# Patient Record
Sex: Female | Born: 1985 | Hispanic: Yes | Marital: Single | Smoking: Former smoker
Health system: Western US, Academic
[De-identification: ages and names within clinical notes are randomized; demographics above are authoritative.]

## PROBLEM LIST (undated history)

## (undated) DIAGNOSIS — B977 Papillomavirus as the cause of diseases classified elsewhere: Secondary | ICD-10-CM

## (undated) DIAGNOSIS — Z531 Procedure and treatment not carried out because of patient's decision for reasons of belief and group pressure: Secondary | ICD-10-CM

## (undated) DIAGNOSIS — E669 Obesity, unspecified: Secondary | ICD-10-CM

## (undated) DIAGNOSIS — IMO0001 Reserved for inherently not codable concepts without codable children: Secondary | ICD-10-CM

## (undated) DIAGNOSIS — G43909 Migraine, unspecified, not intractable, without status migrainosus: Secondary | ICD-10-CM

## (undated) HISTORY — DX: Procedure and treatment not carried out because of patient's decision for reasons of belief and group pressure: Z53.1

## (undated) HISTORY — DX: Reserved for inherently not codable concepts without codable children: IMO0001

## (undated) HISTORY — DX: Migraine, unspecified, not intractable, without status migrainosus: G43.909

## (undated) HISTORY — DX: Papillomavirus as the cause of diseases classified elsewhere: B97.7

## (undated) HISTORY — DX: Obesity, unspecified: E66.9

## (undated) MED ORDER — CALCIPOTRIENE 0.005 % EX OINT
TOPICAL_OINTMENT | CUTANEOUS | 5 refills | Status: AC
Start: 2022-04-22 — End: ?

## (undated) MED ORDER — ESCITALOPRAM OXALATE 10 MG OR TABS
10.00 mg | ORAL_TABLET | Freq: Every day | ORAL | 1 refills | Status: AC
Start: 2021-11-24 — End: ?

## (undated) MED ORDER — OMEPRAZOLE 20 MG OR CPDR
20.00 mg | DELAYED_RELEASE_CAPSULE | Freq: Every day | ORAL | 2 refills | Status: AC
Start: 2022-10-29 — End: ?

---
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
## 2004-01-12 ENCOUNTER — Other Ambulatory Visit (HOSPITAL_BASED_OUTPATIENT_CLINIC_OR_DEPARTMENT_OTHER): Payer: Self-pay

## 2004-01-12 LAB — URINALYSIS
Bilirubin: NEGATIVE
Glucose: NEGATIVE
Ketones: NEGATIVE
Leuk Esterase: NEGATIVE
Nitrite: NEGATIVE
Protein: NEGATIVE
Specific Gravity: 1.022 (ref 1.002–1.030)
Urobilinogen: 0.2 (ref 0.2–?)
pH: 5 (ref 5.0–8.0)

## 2004-02-01 ENCOUNTER — Ambulatory Visit (HOSPITAL_BASED_OUTPATIENT_CLINIC_OR_DEPARTMENT_OTHER): Admitting: Gynecology

## 2004-02-04 LAB — CHLAMYDIA/GONORRHEA PCR, GENITAL: Chlamydia/GC PCR-Genital: NEGATIVE

## 2004-02-06 NOTE — Lab (Signed)
 SPECIMENS SUBMITTED:  Cervical (Pap) Smear     CLINICAL INFORMATION:  LMP: 11/2003  FINAL CYTOLOGIC INTERPRETATION:  No Atypical or Malignant Cells     SPECIMEN ADEQUACY:  Satisfactory for evaluation  The Pap smear is a screening test with an inherent low error rate. Although  a normal [negative] result is highly predictive of the absence of cervical  cancer and its precursor lesions, it does not exclude the presence of  significant disease. Results must be evaluated within the context of the  individual patient.  CONFIDENTIAL HEALTH INFORMATION: Health Care information is personal and  sensitive information. If it is being faxed to you it is done so under  appropriate authorization from the patient or under circumstances that do  not require patient authorization. You, the recipient, are obligated to  maintain it in a safe, secure and confidential manner. Re-disclosure  without additional patient consent or as permitted by law is prohibited.  Unauthorized re-disclosure or failure to maintain confidentiality could  subject you to penalties described in federal and state law. If you have  received this report or facsimile in error, please notify the [facility] immediately and destroy the received document(s).   Material reviewed and Interpreted and  Report Electronically Signed by:  Arie Begin O'Harra CT(ASCP)  Sr. Cytotechnologist  02/06/04 16:30  Electronic Signature derived from a single  controlled access password

## 2004-03-30 HISTORY — PX: CHOLECYSTECTOMY, LAP: 56340

## 2004-05-02 ENCOUNTER — Emergency Department (INDEPENDENT_AMBULATORY_CARE_PROVIDER_SITE_OTHER): Payer: Self-pay

## 2004-05-02 ENCOUNTER — Other Ambulatory Visit (INDEPENDENT_AMBULATORY_CARE_PROVIDER_SITE_OTHER): Payer: Self-pay | Admitting: Emergency Medicine

## 2004-05-02 ENCOUNTER — Inpatient Hospital Stay (HOSPITAL_COMMUNITY): Admitting: Surgery

## 2004-05-02 NOTE — ED Notes (Signed)
============================== ADMIT SUMMARY ==============================    RECEIVING NURSE - Hela      ED NURSE -     +------------------------------- ALLERGIES -------------------------------+   Pen(hives)    +-------------------------- ADMITTING DIAGNOSIS --------------------------+   1) acute abdominal pain   2) gallstone pancreatitis    +--------------------------- ADMITTING SERVICE ---------------------------+   Admit -- Surgery/HC   Level of Care -- Floor    +------------------------ MOST RECENT VITAL SIGNS ------------------------+  BP - 104/56 PULSE - 88   RESPIRATIONS - 18 O2 SAT -   TEMPERATURE - 98.2 MODE -   GCS TOTAL -   PAIN - PAIN QUALITY -   PAIN LOCATION -     +-------------------------------- FLUIDS ---------------------------------+  DATE TIME IV FLUID L/R LOCATION SIZE HUNG ABSORBED  ---------------------------------------------------------------------------    02/03 0530 Normal Saline Left Anticubital 20 1000 ml 0 ml  02/03 1207 Normal Saline Left Anticubital 20 1000 ml 1000 ml   TOTAL IV: 1000 ml    TOTAL OUTPUT: 0 ml TOTAL PO: 0 ml    +------------------------------ MEDICATIONS ------------------------------+  DATE TIME MEDICATION VERIFYING RN RN INIT  ---------------------------------------------------------------------------    02/03 0538 MAALOX ES 30 Milliliters PO RAF  02/03 0538 LIDOCAINE VISCOUS 2% 10 Milliliters RAF   PO  02/03 0538 BELLADONNA ALKALOIDS/PHENOBARB 10 RAF   Milliliters PO-(ELIXIR)  02/03 0538 NORMAL SALINE 1000 Milliliters RAF   IV-(BOLUS)  02/03 0539 PROMETHAZINE 12.5 Milligrams IV RAF  02/03 0820 HYDROMORPHONE 1 Milligrams IV KCS  02/03 1148 HYDROMORPHONE 1 Milligrams IV j   02/03 1206 METRONIDAZOLE 500 Milligrams IV j   02/03 1430 CIPROFLOXACIN 400 Milligrams IV erm    +------------------------------- LABS DONE -------------------------------+  ACT- MD MD RN AP +INITIALS+  IVE DATE TIME TIME TIME TREATMENT ORDERS MD RN AP    ---------------------------------------------------------------------------     02/03 0409 0507 0504 CLEAN CATCH URINALYSIS COMPLETE LGL KEM MQR   Specimen Type: Urine   02/03 0519 0523 0520 CHEM 7 + [place](BASIC META PANEL), LIVER SH RAF MQR   PANEL, LIPASE, CBC WITH DIFF**   Specimen Type: Blood    EKG DONE - NO    +---------------------------- PROCEDURE NOTES ----------------------------+    +------------------------ OTHER NURSING PROCEDURES -----------------------+     +--------------------------- PSYCHOSOCIAL NEEDS --------------------------+  +------------------------- BARRIERS TO LEARNING --------------------------+    ASSESSMENT- Assessment Done with Findings of:      BARRIERS-    No Barriers, Pain  SUPPORT PERSON- mother      SPECIAL CONSIDERATIONS-      ============================== TRIAGE RECORD ==============================    CHIEF COMPLAINT- Abdominal Pain    TIME OF ONSET- today : +-STANDING-+ +--SEATED--+  TRIAGE CATEGORY- 2 : BP PULSE BP PULSE   ROOM- 7A : N/A/N/A N/A 119/78 83   MODE OF ARRIVAL- Car :   IN CUSTODY- No : TEMP MODE O2SAT RESP LMP   PRIVATE MD- Loper : 98.1 Oral 99 18 03/30/2004       :   WORK RELATED INJURY- No : +--GCS--+ +--PUPILS--+  RETURN IN 72 HOURS- None : E V M TOT L R RESPONSE  TRIAGE NURSE- Arsenio Loader : X X X N/A X X N/A     IS THIS VISIT RELATED TO ASSAULT OR DOMESTIC VIOLENCE- None   PAIN TYPE- V NOW- 9 TOLERABLE AT- 4 QUALITY- Sharp      PAIN LOCATION- epigastric RADIATES TO- none   LATEX ALLERGY FORM- No LATEX ALLERGY- No TETANUS- < 5 years   IMMUNIZATION- UTD PED HEIGHT- N/A WEIGHT- N/A KG  ADDITIONAL FORMS- No   +------------------------- CARE PRIOR TO ARRIVAL -------------------------+   None  +------------------------------- ALLERGIES -------------------------------+   Pen(hives)  +------------------------------ MEDICATIONS ------------------------------+   None  +-------------------------- PAST MEDICAL HISTORY -------------------------+    None  +---------------------------- CURRENT HISTORY ----------------------------+   pt. here c/o epigastric pain since this pm, c/o associated vomiting x2    =========================== REASSESSMENT VITALS ===========================   R T M I   E E O N   S M D O2 PUPILS +---GCS----+ I  DATE TIME BP HR P P E SAT L R E V M TOT POSITION T  ---------------------------------------------------------------------------    02/03 0305 119/78 83 18 98.1 O 99 Seated KEM  02/03 0305 / Standing KEM  02/03 0817 133/84 84 16 98.6 O 4 5 6 15  Lying KCS  02/03 1300 138/96 78 18 98.6 O Lying erm  02/03 1300 / erm  02/03 1305 104/56 88 18 98.2 Lying erm     +-----------------PAIN-----------------+   T       Y N T       P O O      DATE TIME E W L LOCATION QUALITY RADIATES COMMENT      ---------------------------------------------------------------------------    02/03 0305 V 9 4 epigastric Sharp none Triage  02/03 0305 Triage  02/03 0817 V 7 4 epigastric Sharp no   02/03 1300   02/03 1300   02/03 1305     ============================= NURSE DISCHARGE =============================    DISCHARGE NURSE- richard marquez   DISPOSITION- Seen & Treated WITH- By Self   ACCOMPANIED BY- N/A   EQUIP Brantley Fling-    N/A   TIME OF DISPOSITION- 05/02/2004 1455 LEFT ED VIA- Wheel Chair   TEANSFERRED TO- N/A REASON- N/A   ADMITTED TO- 6th Floor ROOM- 623A      NURSE REPORT TO- Hela REPORT TIME- 1450   BELONGINGS- With Patient ENVELOPE NUMBER- N/A   CONDITION ON DISCHARGE- Improving   AFTERCARE PROVIDED WITH- Written and Verbal   WHAT AFTERCARE INSTRUCTIONS WERE GIVEN AND REVIEWED WITH PATIENT  AND/OR FAMILY?- (see EPIC instructions)   admitted   IN WHAT LANGUAGE WERE THESE GIVEN?- English OTHER: N/A   TRANSLATED BY- N/A OTHER: N/A   GCS- E: 4 V: 5 M: 6 TOTAL: 15   PAIN LEVEL UPON DISPOSITION- 4 OUT OF 10  EXPECTED OUTCOMES MET- Yes   WHAT MEDS WERE PROVIDED FROM DISCHARGE PYXIS?-    None   RX TO BE FILLED FOR- N/A   DID THE PATIENT OR RESPONSIBLE CARE  PROVIDER UNDERSTAND THE FOLLOW UP  RECOMMENDATION?- Yes DISPOSITION BY- RN        ============================== POINT OF CARE ==============================    OCCULT BLOOD STOOL RESULTS   Norm results neg.  DATE TIME RESULTS DONE BY CONTROL POSTIVE CONTROL NEGATIVE     URINE PREGNANCY TEST   Norm results for non pregnant females neg.  DATE TIME RESULTS DONE BY   02/03 0514 Neg RAF     URINE DIP   Norm results - All neg. with pH 4.6 to 8.0 and urobili 0.1 to 1.0   LEUKO NI- PRO- GLU- URO-   DATE TIME CYTE TRITE PH TEIN COSE KETONES BILI BILI BLOOD BY   02/03 0514 ++ n 5 n n n n n + RAF    FINGER STICK GLUCOSE   Norm results 60 to 110 mg/dl  DATE TIME RESULTS DONE BY     FINGER STICK HEMOGLOBIN   Norm results adult female  14 to 17 gm/dl  Norm results adult female 12 to 16 gm/dl  DATE TIME RESULTS DONE BY     ============================== MD NOTES H&P ===============================    TIME OF NOTE WRITTEN- N/A      CHIEF COMPLAINT- N/A        HISTORY OF PRESENT ILLNESS  02/03 0556 Ferdinand Cava, MD Attending     18y/o female with onset last evening of epig abd discomfort,   sharp, constant but with occasional exacerbations, feels   better if she lays still. occurred last pm after eating fast   food. has happened before over past several months similar   circumstances but only lasted a few minutes. tonight worse   and did not resolve. vom 3 times, no blood, no diarrhea or   melena. pain does radiate to back and sides. no lq abd pain,   no dysuria, freq, flank pain, no vag dc, lmp 1 mo ago, nl,   is 5 months s/p c section.      PAST MEDICAL/SURGICAL HISTORY  02/03 0556 Ferdinand Cava, MD Attending     denies, is o/w healthy      FAMILY HISTORY- denies, no significant fam med prob   SOCIAL HISTORY- denies cig, drugs   OTHER- denies, no significant fam med prob   REVIEW OF SYSTEMS- All other systems reviewed and are negative.     PHYSICAL EXAM  02/03 0556 Ferdinand Cava, MD Attending     overall well appearing, appears in  moderate discomfort   presently   OP moist, neck supple, sclera anicteric   HEENT o/w wnl   lungs cta   cor rrr   abd is obese, soft, pos epig and ruq tend, no frank murphy   sign but sx worsen with deep inspiration. no rlq or llq tend   no guarding no peritoneal signs.

## 2004-05-02 NOTE — ED Notes (Signed)
==============================   ATTENDING NOTE =============================    02/03 4782 Ferdinand Cava, MD Attending     see my H&P

## 2004-05-02 NOTE — ED Notes (Signed)
no cvat   calves nt no c/c/e   Note: explained indication for pelvic exam but patient   states has had too many pelvic exams with recent pregnancy,   does not feel her current sx are related to her pelvic   organs and refuses pelvic exam at this time.      IMPRESSION  02/03 0556 Ferdinand Cava, MD Attending     epig/ruq pain, n/vom. bedside US appears to show gallstone   and ? perichol fluid, cbd not visualized. note upt neg.      MEDICAL DECISION MAKING  02/03 0556 Ferdinand Cava, MD Attending     labs, ivf, antiemetic, ua, formal US in am, reassess.    CASE PRESENTED TO- N/A     ============================= PHYSICIAN NOTES =============================    02/03 1040 Corinne Ports, MD     [place]: 6 mm stone in dital cmd, dilated to 9mm, galsltones in   gallbladder thjickened and edeamtous, - murphys    02/03 1047 Corinne Ports, MD     spoke with dr Dewaine Conger, he wil admit pt, will call back with   abx of choice.      ============================= PROCEDURE NOTES =============================      ================================ LAB NOTES ================================    02/03 0800 Corinne Ports, MD     lipase 1500, lfts elevated    02/03 0800 Ferdinand Cava, MD Attending     labs noted, elevated lft, ap, bili, lipase. await [place]   results. will need admission for management, ivf, likely   surg consult.    02/03 1508 Corinne Ports, MD     [place] noted      ================================= IMAGES ==================================      ============================== MD DISCHARGE ===============================    DISCHARGE PHYSICIAN- Sean-Xavier Neath   CHIEF COMPLAINT- N/A CASE PRESENTED TO- Ferdinand Cava   CONDITION OF DISCHARGE- Stable   WAS THIS VISIT FOR A WORK RELATED ILLNESS OR INJURY- No      PRIMARY CARE PHYSICIAN- ADAMS JOYCE MD   HAS PCP BEEN CONTACTED- No H&P NOTE WAS DICTATED- No   DISCHARGE DIAGNOSIS-    1) acute abdominal pain 2) gallstone pancreatitis     DISCHARGE INSTRUCTIONS      ============================= FOLLOW UP NOTES  =============================

## 2004-05-02 NOTE — ED Notes (Signed)
=================================   ORDERS ==================================    ACT- MD MD RN AP +INITIALS+  IVE DATE TIME TIME TIME TREATMENT ORDERS MD RN AP   ---------------------------------------------------------------------------     02/03 0409 0507 0504 CLEAN CATCH URINALYSIS COMPLETE LGL KEM MQR   Specimen Type: Urine   02/03 0409 0507 0504 POC Urine Dip LGL KEM MQR   02/03 0409 0507 0504 POC UPT LGL KEM MQR   02/03 0519 0523 0520 CHEM 7 + [place](BASIC META PANEL), LIVER SH RAF MQR   PANEL, LIPASE, CBC WITH DIFF**   Specimen Type: Blood  NO 02/03 0519 0520 saline lock [facility] MQR   02/03 0520 0523 0520 MAALOX ES 30 Milliliters PO [facility] RAF MQR   02/03 0521 0523 0552 LIDOCAINE VISCOUS 2% 10 Milliliters SH RAF MQR   PO   02/03 0521 0523 0552 BELLADONNA ALKALOIDS/PHENOBARB 10 SH RAF MQR   Milliliters PO-(ELIXIR)  NO 02/03 0522 0523 0552 CANCELLED -- saline lock [facility] RAF MQR   02/03 0523 0523 0552 NORMAL SALINE 1000 Milliliters SH RAF MQR   IV-(BOLUS)   02/03 0523 0526 0553 PROMETHAZINE 12.5 Milligrams IV SH RAF MQR   02/03 0543 0544 0552 ABDOMEN GENERAL SURVEY [place] SH RAF MQR   Rad Indication: ruq pain, possible   cholecystitis   02/03 0822 0824 0825 HYDROMORPHONE 1 Milligrams IV jab KCS ara   02/03 0822 0824 0825 npo jab KCS ara   02/03 0823 0824 0825 100 cc ns/ hour jab KCS ara   02/03 1059 1115 1103 Admit -- Surgery/HC jab j RXG   Level of Care -- Floor   02/03 1125 1148 1135 change ivf to 150 ns hour jab j ara   02/03 1125 1206 1135 METRONIDAZOLE 500 Milligrams IV jab j ara   02/03 1126 1211 1135 CIPROFLOXACIN 400 Milligrams IV jab j ara   02/03 1126 1148 1136 HYDROMORPHONE 1 Milligrams IV jab j ara   02/03 1205 1206 1211 1 l ns open jab j [facility]

## 2004-05-03 ENCOUNTER — Other Ambulatory Visit (INDEPENDENT_AMBULATORY_CARE_PROVIDER_SITE_OTHER): Payer: Self-pay

## 2004-05-04 NOTE — Consults (Signed)
 Dictating Practitioner: Robb Matar, M.D.    Staff Physician: Ihor Austin. Richardean Canal, M.D.      Date of Consultation: 05/03/2004          GASTROINTESTINAL INPATIENT CONSULTATION    REASON FOR CONSULTATION: Gallstone pancreatitis and questionable need for  ERCP.    HISTORY OF PRESENT ILLNESS: The patient is a 19 year old female without  significant past medical history, who presented to the Emergency Room late  last night with complaints of epigastric pain and nausea and vomiting. The  patient states she has had a similar pain for a while, many months, and she  states it is usually related to spicy foods. The pain is similar in the  same epigastric location, but usually in the past has gone away after 5-10  minutes. She states that the previous pain was about a 4 out of 10 and  occurred approximately two times a week. However, last night around 8  p.m., she developed the same epigastric pain. However, it was more intense  and did not go away. She subsequently developed nausea and vomiting, no  hematemesis and noticed that the pain increased with deep inspiration. She  states the pain was a 10/10 constant, radiating all throughout her abdomen.  She did not report any diarrhea, constipation, bright red blood per rectum  or melena. She also reported no fevers or chills at home.    Since her admission, she has been placed on a Dilaudid PCA which she states  has made her feel much better and the pain is controlled. However, she did  spike a fever late last night to 101.6.    PAST MEDICAL AND SURGICAL HISTORY: Status post C-section August 2005,  history of pyelonephritis with pseudomonas resistant to Bactrim and  obesity.    ALLERGIES: NO KNOWN DRUG ALLERGIES.    MEDICATIONS: Birth control pills.    SOCIAL HISTORY: No tobacco or alcohol.    MEDICATIONS: Cipro, Flagyl, Dilaudid PCA, Reglan, ondansetron p.r.n. and  D5 1/2 normal saline at 150 ml per hour.    PHYSICAL EXAMINATION: Vital Signs: T-max at around 11:30 last  night,  101.6, current temperature 100.1, pulse 99 to 135, blood pressure 142/91,  respirations 20, saturating 97% on room air. General: The patient is an  obese female, appears in no acute distress, comfortable on PCA Dilaudid,  without evidence of jaundice. Cardiac: Tachycardic, regular rhythm, S1  and S2. Chest: Clear to auscultation bilaterally. Abdomen: Soft, obese,  positive bowel sounds, positive epigastric tenderness to palpation.  However, no rebound or guarding. Please note abdominal exam is while on  Dilaudid PCA. Extremities: No cyanosis, clubbing or edema.    LABORATORY DATA: Sodium 139, potassium 3.5, chloride 101, CO2 25, BUN 7,  creatinine 0.7, albumin 3.0. Blood cultures pending. AST 142, down from  256, ALT 336, down from 421. Alkaline phosphatase 195, down from 209.  Direct bilirubin 2.7, up from 1.6, total bilirubin 4.2, up from 2.7, lipase  935, down from 1535, amylase is 680. CBC shows a white count of 21.8, up  from 10.5, with a neutrophil predominance of 91%, 0 bands. Hemoglobin  15.2, hematocrit 41.5, platelets 226, INR 1.1. Urinalysis on admission is  negative.    Abdominal ultrasound on 05/02/04 shows increasing liver size and  echogenicity consistent with fatty liver. No intrahepatic biliary  dilatation. Positive gallstones, along with gallbladder wall thickening  and edema. However, no pericholecystic fluid appreciated. A 6-mm stone  seen in the distal common bile duct with a common  bile duct dilated to 9  mm.    ASSESSMENT AND PLAN: The patient is a 19 year old female without  significant past medical history, now with evidence of gallstone  pancreatitis and cholecystitis. She has been febrile and tachycardic, and  is currently receiving intravenous antibiotics. Her abdominal ultrasound  shows a medium-sized stone in the distal common bile duct with a dilated  common bile duct to 9 mm. She also has elevated amylase and lipase  consistent with pancreatitis, but these numbers are trending  down and her  symptoms are controlled with Dilaudid PCA. Of concern, is the patient's  fever, and increasing leukocytosis, which is worrisome for possible  cholangitis. At this time, her temperature is coming down from 101.6 to  100.1. Her blood pressure is stable, although she does remain tachycardic  and her alkaline phosphatase, ALT and AST are all trending down. Agree  with General Surgery evaluation that the patient may need ERCP for possible  cholangitis and in order to extract the distal common bile duct stone.  However, she may also pass this medium-sized stone on her own, and avoid  the possibility of worsening her pancreatitis with an additional procedure  such as an ERCP.    Currently she is still in the window for emergent ERCP within the 48 hours  of admission, and therefore, we will carefully monitor her in the next  several hours with her vital signs, blood pressure and temperature. She  will be transferred to the IMU per the surgery team, and will continue her  current management with intravenous antibiotics in the form of Cipro and  Flagyl along with a Dilaudid PCA for pain and anti-emetics as needed. We  will also continue aggressive IV fluids, and keep her n.p.o. We will  follow her AST, ALT, total and direct bilirubin, alkaline phosphatase,  amylase, lipase, and CBC with differential in the morning. If these  numbers are not improving or if the patient continues to spike fevers or  becomes in any way unstable and more septic appearing, then we will perform  ERCP tomorrow.    Addendum: After discussion between Dr. Odis Luster and Dr. Richardean Canal, it was  decided to proceed with emergency ERCP immediately because of the ongoing  pain, tachycardia, and elevated WBC, with the known stone on [place].    This case was discussed with the attending, Dr. Richardean Canal.            Job Number [id number] cth              Signature Derived From Controlled Access Password  Ihor Austin. Richardean Canal, M.D. 05/04/2004 14:35    DD: 05/03/2004  DT: 05/03/2004 2:08 P DocNo.: [id number]  [id number]/[email or web address] [email or web address]    Referring Physician:  EMERGENCY DEPARTMENT        Primary Care Physician:  Cleta Alberts MD  [address] [facility]  [place], [place] [address]    cc:

## 2004-05-04 NOTE — Op Note (Signed)
 Dictating Practitioner: Ihor Austin. Richardean Canal, M.D.    Staff Physician: Ihor Austin. Richardean Canal, M.D.    Date of Operation: 05/03/2004              PREOPERATIVE DIAGNOSIS: Choledocholithiasis.  POSTOPERATIVE DIAGNOSIS: Choledocholithiasis.  PROCEDURE: Endoscopic retrograde cholangiogram with biliary sphincterotomy  and removal of common bile duct stone.  ENDOSCOPIST: Jeni Salles ASSISTANT ENDOSCOPIST: Francesca Jewett    INDICATION: This is a 19 year old woman who presented with gallstone  pancreatitis. Ultrasound showed a 6 mm stone in the common bile duct. She  also had stones in her gallbladder. She has been tachycardic and febrile  despite IV antibiotics. Therefore, there is concern that there might be a  component of persistent cholangitis due to a stone obstruction. For this  reason, emergency ERCP is being performed. Because this is considered to  be a high-risk case, the services of the Anesthesia Department have been  requested. The patient and her parents were explained the risks, benefits  and alternatives and signed consent was obtained.    MEDICATIONS: General anesthesia administered by the Anesthesia  Department.    FINDINGS: A therapeutic duodenoscope was passed into the patient's mouth.  The esophagus, stomach and duodenum were grossly normal. The ampulla did  seem to be bulging. Selective cannulation was obtained using a wire-guided  sphincterotome. Initial injection of contrast revealed a probable 6 mm  round filling defect in the distal bile duct. Additionally, the distal  bile duct seemed to have an angulated taper which suggested there could  actually be stone or debris in there as well. A biliary sphincterotomy was  performed in the standard fashion with a good gush of yellow, non-purulent  bile as well as debris. After this, a balloon was used to sweep through a  moderate amount of stone and debris. After this, a wire basket was used to  sweep the duct and nothing else was seen. The duct was irrigated  with  sterile water. After this, a balloon cholangiogram was obtained which  showed a completely normal cholangiogram. The bile duct measured at most  approximately 10 mm and tapered to normal. Note that no pancreatogram was  ever obtained during this procedure.    The patient tolerated the procedure well without any immediate  complications.    IMPRESSION: Successful removal of a small 6 mm common bile duct stone as  well as some sludge within the bile duct. Note that the patient's elevated  white blood cell count and pain may be due to severe acute pancreatitis,  rather than from cholangitis.    RECOMMENDATIONS: (1) Continue intravenous antibiotics. (2) Observe for  signs of severe pancreatitis. (3) Elective cholecystectomy when the  patient is clinically stable. (4) Further recommendations as per the  Surgery service and Dr. Etter Sjogren. (5) Consider abdominal CT to assess  for severity of acute pancreatitis.                          Signature Derived From Controlled Access Password  Ihor Austin. Richardean Canal, M.D. 05/04/2004 14:35    DD: 05/03/2004 DT: 05/04/2004 10:16 A DocNo.: [id number]  [id number].[facility]    Referring Physician:  EMERGENCY DEPARTMENT        Primary Care Physician:  Cleta Alberts MD  [address] [facility]  [place], [place] [address]    cc:

## 2004-05-05 ENCOUNTER — Ambulatory Visit (INDEPENDENT_AMBULATORY_CARE_PROVIDER_SITE_OTHER): Payer: Self-pay

## 2004-05-05 NOTE — Interdisciplinary (Signed)
PERFORMED ON - 05/04/2004 13:00:00;   DONE BY - MCPHERSON, GWENDOLYN M;  PROCEDURE - PDP EVALUATION;   NEURO STATUS: ALERT AND ORIENTED;   HEARTRATE: 129;   RESP RATE: 24;   BREATH SOUNDS: RALES BILAT;   PRE-TITR FIO2 OR L/M: 21;   PRE-TITR SAO2: 88;   TITRATED FIO2 OR L/M: 2;   POST TITRATION SAO2: 94;   MD REQUEST #1: OXYGEN PROTOCOL;   RCP RECOMMENDATION: #1: PER MD REQUEST;   INDICATION: #1: O: TO KEEP SA02 >92;   EVALUATION OUTCOME: #1: AS RCP RECOMMENDED;   ADVERSE REACTIONS: NONE;

## 2004-05-05 NOTE — Interdisciplinary (Signed)
PERFORMED ON - 05/05/2004 16:27:34;   DONE BY - COSTELLO, ROSANNA;  PROCEDURE - PDP RE-EVALUATION;   ADMITTING DIAGNOSIS: GALLSTONES;   NEURO STATUS: ALERT AND ORIENTED;   CODE STATUS: FULL CODE/FULL CARE;   HEARTRATE: 78;   RESP RATE: 16;   HEMOGLOBIN: 12.0;   CLUBBING PRESENT: NO;   TRACHEOSTOMY PRESENT: NO;   SOB: NO;   BREATH SOUNDS: CLEAR;   CHEST EXCURSION: SYMMETRICAL;   MD REQUEST #1: OXYGEN PROTOCOL;   RCP RECOMMENDATION: #1: O2 PROTOCOL D/C;   INDICATION: #1: NOT INDICATED;   EVALUATION OUTCOME: #1: AS RCP RECOMMENDED;   ADVERSE REACTIONS: NONE;

## 2004-05-05 NOTE — Interdisciplinary (Signed)
PERFORMED ON - 05/04/2004 15:10:36;   DONE BY - MCPHERSON, GWENDOLYN M;  PROCEDURE - OXYGEN-LOW FLOW;   PROTOCOL DRIVEN: YES-MD INITIATED;   NEURO STATUS: ALERT AND ORIENTED;   O2 DEVICE: CANNULA-NASAL;   LITERS/MIN: 2;   SATURATION: 94;   O2 LOW-ACTIV OUTCME: CONTINUE PRESENT REGIMEN;   ADVERSE REACTIONS: NONE;

## 2004-05-05 NOTE — Interdisciplinary (Signed)
PERFORMED ON - 05/05/2004 16:27:14;   DONE BY - COSTELLO, ROSANNA;  PROCEDURE - OXYGEN-LOW FLOW;   PROTOCOL DRIVEN: YES-MD INITIATED;   O2 DEVICE: CANNULA-NASAL;   ROOM AIR SAT: 97;   O2 LOW-ACTIV OUTCME: DISCONTINUE THERAPY;   ADVERSE REACTIONS: NONE;

## 2004-05-06 NOTE — Consults (Signed)
 Dictating Practitioner: Robb Matar, M.D.    Staff Physician: Ihor Austin. Richardean Canal, M.D.      Date of Consultation: 05/03/2004      ADDENDUM TO CONSULTATION REPORT IN FINAL STATUS, DOCUMENT #[id number]    After extensive discussion with the Surgical Service regarding plan of care  for the patient, it was decided that the plan will be to take her to the  operating room for cholecystectomy tomorrow; therefore, ERCP will be needed  prior to her surgery because of the known common bile duct obstruction.  Therefore, we will arrange for ERCP tonight.          Signature Derived From Controlled Access Password  Ihor Austin. Richardean Canal, M.D. 05/06/2004 09:23    DD: 05/03/2004 DT: 05/05/2004 9:34 A DocNo.: [id number]  DK/[email or web address]    Referring Physician:  EMERGENCY DEPARTMENT        Primary Care Physician:  Cleta Alberts MD  [address] [facility]  [place], [place] [address]    cc:

## 2004-05-08 ENCOUNTER — Other Ambulatory Visit (INDEPENDENT_AMBULATORY_CARE_PROVIDER_SITE_OTHER): Payer: Self-pay

## 2004-05-12 ENCOUNTER — Inpatient Hospital Stay (INDEPENDENT_AMBULATORY_CARE_PROVIDER_SITE_OTHER): Payer: Self-pay

## 2004-05-14 ENCOUNTER — Ambulatory Visit (HOSPITAL_BASED_OUTPATIENT_CLINIC_OR_DEPARTMENT_OTHER): Admitting: Surgery

## 2004-05-21 ENCOUNTER — Other Ambulatory Visit (INDEPENDENT_AMBULATORY_CARE_PROVIDER_SITE_OTHER): Payer: Self-pay

## 2004-05-21 ENCOUNTER — Ambulatory Visit (HOSPITAL_BASED_OUTPATIENT_CLINIC_OR_DEPARTMENT_OTHER): Admitting: Surgery

## 2004-05-23 NOTE — Discharge Summary (Signed)
 Dictating Practitioner: [place] H. Jonessa Triplett, M.D.    Attending Physician: Ronelle Nigh, M.D.      Date of Admission: 05/02/2004  Date of Discharge: 05/12/2004          DISCHARGE DIAGNOSES: (1) Gallstone pancreatitis. (2) Obesity.    PROCEDURES: ERCP with sphincterotomy and removal of stone.    DISCHARGE MEDICATIONS: Flagyl 500 mg q. 8 hours times 14 days, Cipro 500  mg b.i.d. times 14 days.    FOLLOWUP APPOINTMENTS: The patient will follow up this Wednesday, May 13, 2004, in General Surgery Clinic. She will have CBC drawn prior to  clinic.    PRESENT ILLNESS: The patient is a 19 year old female with epigastric  abdominal pain. She has been experiencing intermittent abdominal pain,  crampy for several months, primary care physician put her on an H2 blocker  and now she is presenting with severe epigastric pain times 12 hours.  Ultrasound showed a 9 mm common bile duct stone with nonobstructing distal  gallstone. LFTs are elevated, AST 256, ALT 421, total bilirubin 2.7,  direct bilirubin 1.6, alkaline phosphatase is 209, lipase 1500 on  admission.    PAST MEDICAL HISTORY: The patient has no past medical history other than  the C. section.    HOSPITAL COURSE: The patient was admitted and started on conservative  therapy. She was given IV hydration and started on Cipro and Flagyl. GI  was consulted and performed an ERCP with sphincterotomy and the stone was  removed. Postoperatively she did fine and she said her pain was resolving.  However, she still continued to have elevated white count. She was started  on IV meropenem. Of note, the patient had past ALLERGY TO PENICILLIN and  she was required to undergo a meropenem titration. She tolerated this  without any complications. The patient did fine on the meropenem and was  transferred to the floor. However, after the meropenem was stopped after a  couple of days, her white count again continued to elevate and she began to  have low-grade fevers. The patient did not  complain of any problems,  however. The meropenem was again restarted and titrated as well and the  patient became afebrile as well as white count was reduced to 11.4 on the  day of discharge. Otherwise, the patient had no problems while in house.  She remained ambulatory. Her abdominal pain was not present at the time of  discharge. At discharge, she was tolerating a p.o. diet and having bowel  movements. She has been instructed to take it easy on her p.o. diet after  discharge. We will be seeing her in two days in outpatient General Surgery  Clinic. She has been instructed to get a CBC drawn prior to her  presentation. This is just to keep a very close eye on her.    CONDITION ON DISCHARGE: Improved.                      Reviewed & Electronically Signed  [place] H. Marche Hottenstein, M.D. 05/20/2004 17:57    Signature Derived From Controlled Access Password  Ronelle Nigh, M.D. 05/23/2004 17:59    DD: 05/12/2004 DT: 05/13/2004 11:16 A DocNo.: [id number]  [id number].[facility]      Referring Physician:  EMERGENCY DEPARTMENT        Primary Care Physician:  Cleta Alberts MD  [address] [facility]  [place], [place] [address]    cc:

## 2004-06-11 ENCOUNTER — Encounter (HOSPITAL_BASED_OUTPATIENT_CLINIC_OR_DEPARTMENT_OTHER): Payer: Self-pay | Admitting: Surgery

## 2004-07-02 ENCOUNTER — Ambulatory Visit (HOSPITAL_BASED_OUTPATIENT_CLINIC_OR_DEPARTMENT_OTHER): Admitting: Surgery

## 2004-08-07 ENCOUNTER — Other Ambulatory Visit (INDEPENDENT_AMBULATORY_CARE_PROVIDER_SITE_OTHER): Payer: Self-pay | Admitting: Surgery

## 2004-08-07 ENCOUNTER — Ambulatory Visit: Admitting: Surgery

## 2004-08-20 ENCOUNTER — Ambulatory Visit (HOSPITAL_BASED_OUTPATIENT_CLINIC_OR_DEPARTMENT_OTHER): Admitting: Surgery

## 2004-08-28 NOTE — Progress Notes (Signed)
CLINIC: GENERAL SURGERY    REPORT TYPE: NOTE    Dictating Practitioner: Ronelle Nigh, M.D.      DATE OF SERVICE: 08/21/2004    REASON FOR VISIT: FOLLOWUP    HISTORY OF PRESENT ILLNESS: Ms. Mijangos is here postoperatively, doing well  without complaints. Please see the handwritten note.    PLAN: We will see her back on a p.r.n. basis.      Job #: [id number]          Signature Derived From Controlled Access Password  Ronelle Nigh, M.D. 08/28/2004 07:38        DD: 08/21/2004 DT: 08/22/2004 7:00 A DocNo.: [id number]  [id number]      Primary Care Physician:  Loreli Slot        cc:    : Malachi Paradise, MD  : [facility]

## 2004-09-23 ENCOUNTER — Emergency Department (INDEPENDENT_AMBULATORY_CARE_PROVIDER_SITE_OTHER): Payer: Self-pay

## 2004-09-23 ENCOUNTER — Emergency Department (HOSPITAL_BASED_OUTPATIENT_CLINIC_OR_DEPARTMENT_OTHER): Admitting: Emergency Medicine

## 2004-09-23 NOTE — ED Notes (Signed)
=================================   ORDERS ==================================    ACT- MD MD RN AP +INITIALS+  IVE DATE TIME TIME TIME TREATMENT ORDERS MD RN AP   ---------------------------------------------------------------------------     06/27 2127 2128 2131 Discharge SPN TLB GAS

## 2004-09-23 NOTE — ED Notes (Signed)
==============================   ATTENDING NOTE =============================    06/27 2206 Kenn File, MD Attending     Patient seen, I was present for the relevant part of the   history and physical examination. Management of the case   discussed with the resident, Dr. Gerhard Perches. I have reviewed the   residents H&P.       Key elements of the history notable for 19 yo HF with pmhx   sig for c-section in 10/2003 and laparoscopic cholecystectomy   1 month ago presents with erythema and ttp at the left edge   of the c-section scar. no f/ch. no masses. pt called her   clinic and they told her to come to the ED. no n/v/d. no   dysuria or hematuria. no drainage. no fluctuance or   induration. no h/o recurrent abscesses. Key elements of   the physical examination are notable for overall well   appearing hf in nad, mentation intact. vs noted. afebrile.   o2 sat 97% on room air which is normal. heent: ncat. abd:   nabs. soft. nt. nd. no hsm. small amount of erythema at the   left edge of the c-section incision. no fluctuance. no   induration. mild ttp. no drainage. ext: no c/c/e. neuro:   awake and alert. ox4. maew. normal gait.       A/P: 1. small stitch abscess vs folliculitis   no need for i and d at this time. no need for abx.    recommend warm compresses and then follow up in local clinic   in 2-3 days. return for worsening redness, pain, drainage,   induration or fluctuance. I agree with the residents   findings, work up, assessment/management plans.

## 2004-09-23 NOTE — ED Notes (Signed)
=============================   PROCEDURE NOTES =============================      ================================ LAB NOTES ================================      ================================= IMAGES ==================================      ============================== MD DISCHARGE ===============================    DISCHARGE PHYSICIAN- Sean Nordt   CHIEF COMPLAINT- N/A CASE PRESENTED TO- Kenn File   CONDITION OF DISCHARGE- Stable   WAS THIS VISIT FOR A WORK RELATED ILLNESS OR INJURY- No      PRIMARY CARE PHYSICIAN- WU JENNIFER MD   HAS PCP BEEN CONTACTED- No H&P NOTE WAS DICTATED- No     +-------------------------- DISCHARGE DIAGNOSIS --------------------------+    WRITE IN DIAGNOSIS-    N/A folliculitis     +------------------------- DISCHARGE INSTRUCTIONS------------------------+    06/27 2127 [name], MD     PHYSICIAN- Kenn File, MD Attending FOLLOW-UP (DAYS)- 2 days    APPOINTMENT- No RETURN TO- N/A LANGUAGE- English    INSTRUCTIONS-   CELLULITIS    MEDICATIONS-   REFERRAL CLINICS-   REFERRAL PHYSICIANS-   ADDITIONAL INSTRUCTIONS-   You were seen today for your skin infection.    This appears to be an ingrown hair.    Apply warm compresses to the area four times a day to help    open up and drain the wound.    Return to the E.R. for severe, persistent, new or worsening   symptoms    or any other concerns.    Follow up with your primary care physician for continued   care.      ============================= FOLLOW UP NOTES =============================

## 2004-09-23 NOTE — ED Notes (Signed)
============================== ADMIT SUMMARY ==============================    RECEIVING NURSE -   ED NURSE -     +------------------------------- ALLERGIES -------------------------------+   pcn    +-------------------------- ADMITTING DIAGNOSIS --------------------------+       +--------------------------- ADMITTING SERVICE ---------------------------+  ADMISSION SERVICE -   LEVEL OF CARE -   ATTENDING -   RESIDENT -     +------------------------ MOST RECENT VITAL SIGNS ------------------------+  BP - 136/88 PULSE - 96   RESPIRATIONS - 16 O2 SAT - 97   TEMPERATURE - 99.4 MODE - Oral   GCS TOTAL - 15   PAIN - 6 PAIN QUALITY - Constant   PAIN LOCATION - scar   DATE/TIME - 09/23/2004 2028    +-------------------------------- FLUIDS ---------------------------------+  DATE TIME IV FLUID L/R LOCATION SIZE HUNG ABSORBED  ---------------------------------------------------------------------------     TOTAL IV: 0 ml    TOTAL OUTPUT: 0 ml TOTAL PO: 0 ml    +------------------------------ MEDICATIONS ------------------------------+  DATE TIME MEDICATION VERIFYING RN RN INIT  ---------------------------------------------------------------------------      +------------------------------- LABS DONE -------------------------------+  ACT- MD MD RN AP +INITIALS+  IVE DATE TIME TIME TIME TREATMENT ORDERS MD RN AP   ---------------------------------------------------------------------------      EKG DONE - NO    +---------------------------- PROCEDURE NOTES ----------------------------+    +------------------------ OTHER NURSING PROCEDURES -----------------------+     +--------------------------- PSYCHOSOCIAL NEEDS --------------------------+  +------------------------- BARRIERS TO LEARNING --------------------------+    ASSESSMENT- Assessment Done with Findings of:      BARRIERS-    No Barriers  SUPPORT PERSON-      SPECIAL CONSIDERATIONS-      ============================== TRIAGE RECORD ==============================    CHIEF  COMPLAINT- Wound Check    TIME OF ONSET- 1 wk : +-STANDING-+ +--SEATED--+  TRIAGE CATEGORY- 3 : BP PULSE BP PULSE   ROOM- UC1 : N/A/N/A N/A 136/88 96   MODE OF ARRIVAL- Car :   IN CUSTODY- No : TEMP MODE O2SAT RESP LMP   PRIVATE MD- N/A : 99.4 Oral 97 16 09/09/04       :   WORK RELATED INJURY- No : +--GCS--+ +--PUPILS--+  RETURN IN 72 HOURS- None : E V M TOT L R RESPONSE  TRIAGE NURSE- Ruth Hall : 4 5 6 15  X X N/A     IS THIS VISIT RELATED TO ASSAULT OR DOMESTIC VIOLENCE- no   PAIN TYPE- V NOW- 6 TOLERABLE AT- 4 QUALITY- Constant      PAIN LOCATION- scar RADIATES TO- none   LATEX ALLERGY FORM- No LATEX ALLERGY- No TETANUS- Does not recall  IMMUNIZATION- UTD PED HEIGHT- N/A WEIGHT- N/A KG  ADDITIONAL FORMS- No   +------------------------- CARE PRIOR TO ARRIVAL -------------------------+   none  +------------------------------- ALLERGIES -------------------------------+   pcn  +------------------------------ MEDICATIONS ------------------------------+   BCP  +-------------------------- PAST MEDICAL HISTORY -------------------------+   cholestectomy, c-section  +---------------------------- CURRENT HISTORY ----------------------------+   pt had c-section 9/05 an on the corner of her incision there is a "bump"   that is getting "bigger and red." no discharge    =========================== REASSESSMENT VITALS ===========================   R T M I   E E O N   S M D O2 PUPILS +---GCS----+ I  DATE TIME BP HR P P E SAT L R E V M TOT POSITION T  ---------------------------------------------------------------------------    06/27 2028 136/88 96 16 99.4 O 97 4 5 6 15  Seated KZ   06/27 2028 / Standing KZ      +-----------------PAIN-----------------+   T  Y N T       P O O      DATE TIME E W L LOCATION QUALITY RADIATES COMMENT      ---------------------------------------------------------------------------    06/27 2028 V 6 4 scar Constant none Triage  06/27 2028 Triage    ============================= NURSE DISCHARGE  =============================    DISCHARGE NURSE- Ruth Hall   DISPOSITION- Treated and Discharged WITH- Family   ACCOMPANIED BY- N/A   EQUIP W/TRANSPORT-    N/A   TIME OF DISPOSITION- 09/23/2004 2130 LEFT ED VIA- [facility]   TRANSFERRED TO- N/A REASON- N/A   ADMITTED TO- N/A ROOM- N/A   NURSE REPORT TO- N/A REPORT TIME- X N/A      BELONGINGS- N/A ENVELOPE NUMBER- N/A   CONDITION ON DISCHARGE- Improving   AFTERCARE PROVIDED WITH- Written and Verbal   WHAT AFTERCARE INSTRUCTIONS WERE GIVEN AND REVIEWED WITH PATIENT  AND/OR FAMILY?- (see EPIC instructions)   follow up/warm compress   IN WHAT LANGUAGE WERE THESE GIVEN?- English OTHER: N/A   TRANSLATED BY- N/A OTHER: N/A   GCS- E: N/A V: N/A M: N/A TOTAL: N/A  PAIN LEVEL UPON DISPOSITION- 4 OUT OF 10  EXPECTED OUTCOMES MET- Yes   WHAT MEDS WERE PROVIDED FROM DISCHARGE PYXIS?-    None   RX TO BE FILLED FOR- N/A   DID THE PATIENT OR RESPONSIBLE CARE PROVIDER UNDERSTAND THE FOLLOW UP  RECOMMENDATION?- Yes DISPOSITION BY- RN      NURSING LEVEL- 1. Simple ED Visit: Minimal Interaction     ============================== POINT OF CARE ==============================    OCCULT BLOOD STOOL RESULTS   Norm results neg.  DATE TIME RESULTS DONE BY CONTROL POSTIVE CONTROL NEGATIVE     URINE PREGNANCY TEST   Norm results for non pregnant females neg.  DATE TIME RESULTS DONE BY     URINE DIP   Norm results - All neg. with pH 4.6 to 8.0 and urobili 0.1 to 1.0   LEUKO NI- PRO- GLU- URO-   DATE TIME CYTE TRITE PH TEIN COSE KETONES BILI BILI BLOOD BY     FINGER STICK GLUCOSE   Norm results 60 to 110 mg/dl  DATE TIME RESULTS DONE BY     FINGER STICK HEMOGLOBIN   Norm results adult female 38 to 17 gm/dl  Norm results adult female 34 to 16 gm/dl  DATE TIME RESULTS DONE BY     ============================== MD NOTES H&P ===============================    TIME OF NOTE WRITTEN- N/A      CHIEF COMPLAINT- N/A        HISTORY OF PRESENT ILLNESS  06/27 2120 Ruth Leaver, MD     19 y/o female w/ pmh of  c section 09/05, s/p cholecystectomy   c/o 7d hx of LT sided painful "bump" at lateral aspect of   pfanensteil incision scar, no f/c/n/v/d, no other   constitutuional sx, no problems or infxn at scar site   previously, no dm, pt has large pannus and pubic hair line   crosses incision site, no pus or discharge, ros own      PAST MEDICAL/SURGICAL HISTORY  06/27 2120 Ruth Leaver, MD     as in triage noted      FAMILY HISTORY- nc   SOCIAL HISTORY- nc   OTHER- nc   REVIEW OF SYSTEMS- All other systems reviewed and are negative.     PHYSICAL EXAM  06/27 2124 Ruth Leaver, MD     Vital signs reviewed and noted  obese female in NAD   A&O*3    nontoxic    HEENT NCAT, PERRL, EOMI, no conjunctival pallor, no scleral   icterus, MMM    Neck supple nt, no lad, no thyromegaly    Chest CTAB no added sounds    Card RRR no added sounds    ABD soft, obese, well healed pfanensteil incision w/ small   papule w/ small punctum c/w folliculitis at lateral aspect   of incision site, no fluctuance, no surrounding cellulitis,   no induration, mild ttp, +BS, no m/r/g    Back no CVAT, no spinal TTP   EXT warm, dry, no c/c/e, 2/2 pulses, 5/5 strength    Neuro: cn II-XII grossly intact, grossly nonfocal      IMPRESSION  06/27 2120 Ruth Leaver, MD     -19 y/o female w/ probable folliculitis      MEDICAL DECISION MAKING  06/27 2121 Ruth Leaver, MD     -no fluctuance, no need for I+D or abx    -dx folliculitis    -plan warm compresses to area QID to allow to drain    -RTED d/w pt    -pt dc'd in good condition    CASE PRESENTED TO- N/A     ============================= PHYSICIAN NOTES =============================

## 2004-11-26 ENCOUNTER — Telehealth (INDEPENDENT_AMBULATORY_CARE_PROVIDER_SITE_OTHER): Payer: Self-pay | Admitting: Family Medicine

## 2004-11-26 NOTE — Telephone Encounter (Signed)
Pt hit L. Foot 4th digit on trash can. It is now discolored swollen and becomes painful upon ambulation (too much). Asked her to keep foot elevated, iced, and to take anti-inflammatoid medication. Scheduled appointment for Friday at 4:00pm with Dr. Salley Scarlet.

## 2004-11-26 NOTE — Telephone Encounter (Signed)
Patient has yet to be seen in our clinic. Patient banged her toe against a trash can Monday morning. Patient is able to walk on it, but this causes swelling. There is bruising on the toe as well. Patient needs to be advised what to do. Please return call to patient at [phone]. Thank you.

## 2004-11-27 NOTE — Telephone Encounter (Addendum)
Spoke with pt r/t time to show up for X-ray and to tape toe.

## 2004-11-27 NOTE — Telephone Encounter (Signed)
Herma Ard you a message but forgot to mention to tell pt to tape toe to adjacent toe for support/symptomatic relief. Xrays ordered but need not be done if swelling/pain improves prior to appt.    Thanks.    Barkley Bruns

## 2004-11-28 ENCOUNTER — Ambulatory Visit (INDEPENDENT_AMBULATORY_CARE_PROVIDER_SITE_OTHER)

## 2004-11-28 ENCOUNTER — Encounter (INDEPENDENT_AMBULATORY_CARE_PROVIDER_SITE_OTHER): Payer: Self-pay

## 2004-11-28 VITALS — BP 114/79 | HR 84 | Temp 98.5°F | Wt 206.0 lb

## 2004-11-28 MED ORDER — IBUPROFEN 200 MG OR CAPS
ORAL_CAPSULE | ORAL | Status: DC
Start: ? — End: 2006-03-05

## 2004-11-28 MED ORDER — ORTHO-CYCLEN (28) 0.25-35 MG-MCG OR TABS
ORAL_TABLET | ORAL | Status: DC
Start: ? — End: 2005-01-20

## 2004-11-28 NOTE — Patient Instructions (Signed)
TOE FRACTURE   GENERAL INFORMATION:     What is it? A toe fracture is a break in the bone of the toe. A broken bone usually takes about six weeks to heal. If the toe still hurts, you may need an x-ray to see how the toe is healing.    What causes a toe fracture? Toes can be broken in many different ways. They are often broken when crushed, while playing a sport, or when exercising.    What are the signs and symptoms of a toe fracture?     Your toe may hurt when you put weight on it or walk. You may not be able to bend or move your toe.      The skin on your broken toe may swell, bruise, and hurt. If the skin is broken, the toe may bleed.      Your toe may feel weak, numb, or tingly. It may also feel stiff.      Your toe may not look normal or may even look bent if the bones are out of place.     How is a toe fracture diagnosed? Your caregiver will examine you and ask questions about your injury. You may need x-rays. These are pictures of the bones inside of your toes and foot.    How is a toe fracture treated? Your treatment will depend on which toe is broken and the type of fracture you have. You may need to do one or more of the following to treat your fractured toe:      Special shoe: You should wear a shoe with a hard sole and plenty of support. This will limit movement and protect your toe so it can heal. This shoe may also make it easier for you to walk.      Ice, elevate, and rest: Put crushed ice in a plastic bag or use a bag of frozen peas or corn. Cover it with a towel. Place this on your toe for 15 to 20 minutes every hour as long as you need it. Lift your foot above the level of your heart and rest it on pillows while sitting or lying down. This will help decrease the swelling and pain of the toe. Rest your toe and do not exercise with that foot until your toe is healed.      Medicine:     Over-the-counter pain medicine: You may use over-the-counter (OTC) pain medicines, such as ibuprofen or  acetaminophen, for your pain. These may be bought at grocery and drug stores. Ask your caregiver before taking OTC medicine if you are also taking pain medicine ordered (prescribed) for you.       Adapted from  1974 - 2006 [employer]. All rights reserved. The CareNotes(TM) System Vol. 40 expires 11/2004     --------------------------------------------------------------------------------  TOE FRACTURE General Information - Fri Sep 01 16:47:00 2006

## 2004-11-29 NOTE — Progress Notes (Signed)
SUBJECTIVE:  Ruth Hall is a 19 year old female who hit toe against trashcan 3 weeks ago. Initially no pain until following day. Pt still with pain and some swelling only when walking. Pain and swelling improved since initial trauma. Pt treated toe with ice after speaking with triage RN which she felt helped sig w/ swelling.      OBJECTIVE:  BP 114/79  Pulse 84  Temp (Src) 98.5 (Oral)  Wt 206 lbs (93.4kg)  L foot - 4 toe slightly swollen, no bruising, good cap refill      (from radiology report)  FINDINGS:  Three views of the left foot.  There is a minimally displaced spiral fracture of the fourth toe proximal phalanx that is extra-articular.      IMPRESSION:  Fourth toe fracture.       ASSESSMENT:  826.0 FX PHALANX, FOOT-CLOSED Acute (primary encounter diagnosis)  Note: [facility] [place], ~11/03/04  Plan: Hard soled shoe. Ice, elevation for sypmtomatic relief. Pt advised that toe fractures usu need ~6wk to completely heal.      Pt discussed with and seen by Dr. Luevenia Maxin.

## 2004-12-02 NOTE — Progress Notes (Signed)
ATTENDING NOTE:    SUBJECTIVE:   I reviewed the history.  Patient interviewed and examined.  History of present illness (HPI):   117RFV1 Patient presents with:     Establish Care - swelling in left toe with pain when walks about 3 weeks   117RFV1   Review of Systems (ROS): As per  the resident's note.  Past Medical, Family, Social History:  As per  the resident's  note.    OBJECTIVE:   I have examined the patient and I concur with the resident's exam and of note is toe is well aligned and not likely to be helped by "buddy taping".    MEDICAL PLAN of CARE:     Assessment and plan reviewed with the resident physician.  I agree with the resident's plan as documented.    See the resident's note for further details.

## 2005-01-20 ENCOUNTER — Encounter (INDEPENDENT_AMBULATORY_CARE_PROVIDER_SITE_OTHER): Payer: Self-pay | Admitting: Family Medicine

## 2005-01-20 ENCOUNTER — Ambulatory Visit (INDEPENDENT_AMBULATORY_CARE_PROVIDER_SITE_OTHER): Admitting: Family Medicine

## 2005-01-20 VITALS — BP 125/80 | HR 85 | Temp 97.2°F | Wt 206.0 lb

## 2005-01-20 LAB — UA, CHEM ONLY POCT
Glucose: NEGATIVE
Nitrite: NEGATIVE
Specific Gravity: 1.03 (ref 1.002–1.030)
Urobilinogen: 0.2 U/dL (ref 0.2–1.0)
pH: 5 (ref 5–8)

## 2005-01-20 MED ORDER — CLOTRIMAZOLE 1 % EX CREA
TOPICAL_CREAM | CUTANEOUS | Status: DC
Start: 2005-01-20 — End: 2006-03-05

## 2005-01-20 MED ORDER — ORTHO-CYCLEN (28) 0.25-35 MG-MCG OR TABS
ORAL_TABLET | ORAL | Status: DC
Start: 2005-01-20 — End: 2005-11-04

## 2005-01-20 MED ORDER — DIFLUCAN 150 MG OR TABS
ORAL_TABLET | ORAL | Status: DC
Start: 2005-01-20 — End: 2005-11-04

## 2005-01-20 NOTE — Progress Notes (Signed)
Lab Results   Basename Value Date/Time   . COLORUA YELLOW 01/20/2005   . APPEARUA CLEAR 01/20/2005   . GLUCOSEUA NEG 01/20/2005   . BILIUA NEG 01/20/2005   . KETONEUA + 01/20/2005   . SGUA 1.030 01/20/2005   . BLOODUA NEG 01/20/2005   . PHUA 5 01/20/2005   . PROTEINUA NEG 01/20/2005   . UROBILUA 0.2 01/20/2005   . NITRITEUA NEG 01/20/2005   . LEUKESTUA NEG 01/20/2005   . WBCUA 0-2 05/02/2004   . RBCUA 6-10 05/02/2004   . CRYSTALSUA AMORPHOUS 11/29/2003       HCG NEG

## 2005-01-20 NOTE — Patient Instructions (Signed)
Please start taking folic acid 400 mcg/day. This prevents birth defects.    ------------  Vaginal Yeast Infections    What is a vaginal yeast infection?  Vaginal yeast infections are caused by a fungus called Candida albicans Yeast are tiny organisms that normally live in small numbers on the skin and inside the vagina. The acidic environment of the vagina helps keep yeast from growing. If the vagina becomes less acidic, too many yeast can grow and cause a vaginal infection.    The acidic balance of the vagina can be changed by your period (menstruation), pregnancy, diabetes, some antibiotics, birth control pills and steroids. Moisture and irritation of the vagina also seem to encourage yeast to grow.    How do I know if I have a yeast infection?  Yeast infections can be very uncomfortable, but are usually not serious. Symptoms include the following:    Itching and burning in the vagina and around the vulva (the skin that surrounds your vagina)   A white vaginal discharge that may look like cottage cheese   Pain during sexual intercourse   Swelling of the vulva  Yeast infections are so common that 3/4 of women will have one at some time in their lives. Half of all women have more than one infection in their lives. If you have symptoms of a yeast infection, your doctor will probably want to talk to you about your symptoms and examine you to make sure a yeast infection is the cause.    How are these infections treated?  Yeast infections are usually treated with medicine that you put into your vagina. This medicine may be a cream that you insert in your vagina with a special applicator, or it may be a suppository that you put into your vagina and allow to dissolve on its own. Medicine in a cream form can also be put on your vulva to help relieve itching. Medicine in a pill form that you take by mouth is also available.    Should I see my doctor every time I have a yeast infection?  Be sure to see your doctor the first  time you have symptoms of a yeast infection. It's very important to make sure you have a yeast infection before you start taking medicine. The symptoms of a yeast infection are also the symptoms of other infections, such as some STDs. Treating yourself for a yeast infection when you actually have another type of infection may make the problem much worse.    If you have often been diagnosed with yeast infections, talk to your doctor about using a medicine you can buy without a prescription.     How can I avoid getting another infection?  Here are some things you can do to help prevent another yeast infection:    Don't wear tight-fitting or synthetic-fiber clothes.   Wear cotton panties.   Don't wear pantyhose or leotards every day.   Use your blow dryer on a low, cool setting to help dry your genital area after you bathe or shower and before getting dressed.   Wipe from front to back after using the toilet. This may help prevent the bacteria that normally live in your rectum from getting into your vagina.   Change out of wet swimsuits or other damp clothes as soon as you can.   Don't douche or use feminine hygiene sprays, deodorant sanitary pads or tampons, or bubble bath, and avoid using colored or perfumed toilet paper. These items  seem to affect the balance of acidity of the vagina and can lead to symptoms of a yeast infection.    Does my sexual partner need to be treated?   No. Doctors have found no benefit to treating the sexual partners of women with yeast infections.    Copyright  American Academy of [employer]icians 2004  Copyright  [employer]. All rights reserved.   www.mdconsult.com

## 2005-01-20 NOTE — Progress Notes (Signed)
Ruth Hall is a 19 year old female who complains of yeast infection. She has had this problem in the past. She states that after she had baby, told she had a fungus; used a seven day cream. Then got the same thing a month later. Gave same cream but higher dosage but it didn't work. Had baby August 2005. Previously given a one time pill by her adolescent medicine doctor, Dr. Joya Gaskins and this worked well. She is sexually active. She states that it burns with urination after she has sex. Partner is circumcised. She is using oral contraceptives but often forgets to take. Does not want to try nuva ring or depo shot. Husband is wishing her to get pregnant again, but not too soon.    Patient admits to eating poorly including a lot of chips, soda, cereals. Wishing to go to the nutritionist. Notes that her legs are sometimes swollen.    Menses are generally regular if she takes her OCPs correctly. But she did miss her last menses once she stopped the pill.    OBJECTIVE:  BP 125/80  Pulse 85  Temp (Src) 97.2 (Oral)  Wt 206 lbs (93.4kg)  LMP 01/08/2005  General appearance: in no apparent distress, well developed and well nourished and obese.  Pelvic exam: + erythema and cracking of vulva skin around outer labia and mons. Patient generally tender with vaginal exam. normal cervix without lesions, polyps or tenderness, uterus normal size, shape, consistency, no mass or tenderness, adnexa normal in size without mass or tenderness. Exam somewhat limited by obesity.      ASSESSMENT and PLAN:  Patient will take both diflucan and use external clotrimazole. Advised to reduce sugar intake.    Advised to modify diet to reduce soda and carbohydrates. Nutrition referral.    Pain with urination after sexual activity likely related to yeast infection.     Reminded regarding importance of using contraception and also to take folic acid. Irregular menses likely related to irregular OCP use and PCOS.    Encounter Diagnoses   Code  Name Primary?   . V72.31 ROUTINE GYN EXAMINATION Yes   . 626.4 IRREGULAR MENSTRUATION     Plan: URINE PREGNANCY TEST POCT   . 278.00 OBESITY NOS     Plan: CONSULT/REFERRAL TO NUTRITION   . V82.9 SCREENING FOR CONDITION NOS     Plan: CHLAMYDIA/GC PCR-GENITAL   . V76.2 SCREENING MAL NEOP-CERVIX     Plan: OBTAINING SCREEN PAP SMEAR, CYTOPATH GYN   . V25.09 CONTRACEPTIVE MANGMT NEC     Plan: ORTHO-CYCLEN (28) 0.25-35 MG-MCG OR TABS   . 616.10 VAGINITIS NOS     Plan: DIFLUCAN 150 MG OR TABS, CLOTRIMAZOLE 1 % EX CREA   . 788.1 DYSURIA     Plan: UA, CHEM ONLY POCT (BAYER)

## 2005-01-26 NOTE — Lab (Signed)
SPECIMENS SUBMITTED:  Cervical (Pap) Smear     CLINICAL INFORMATION:  LMP: 01/08/2005 Other Clinical Findings: Yeast Infection  FINAL CYTOLOGIC INTERPRETATION:  No Atypical or Malignant Cells     SPECIMEN ADEQUACY:  Satisfactory for evaluation  The Pap smear is a screening test with an inherent low error rate. Although  a normal [negative] result is highly predictive of the absence of cervical  cancer and its precursor lesions, it does not exclude the presence of  significant disease. Results must be evaluated within the context of the  individual patient.  CONFIDENTIAL HEALTH INFORMATION: Health Care information is personal and  sensitive information. If it is being faxed to you it is done so under  appropriate authorization from the patient or under circumstances that do  not require patient authorization. You, the recipient, are obligated to  maintain it in a safe, secure and confidential manner. Re-disclosure  without additional patient consent or as permitted by law is prohibited.  Unauthorized re-disclosure or failure to maintain confidentiality could  subject you to penalties described in federal and state law. If you have  received this report or facsimile in error, please notify the [facility] immediately and destroy the received document(s).   Material reviewed and Interpreted and  Report Electronically Signed by:  Margorie John CT(ASCP)  Sr. Cytotechnologist  01/26/05 13:09  Electronic Signature derived from a single  controlled access password

## 2005-05-07 ENCOUNTER — Telehealth (INDEPENDENT_AMBULATORY_CARE_PROVIDER_SITE_OTHER): Payer: Self-pay | Admitting: Family Medicine

## 2005-05-08 ENCOUNTER — Ambulatory Visit (INDEPENDENT_AMBULATORY_CARE_PROVIDER_SITE_OTHER): Admitting: Family Medicine

## 2005-05-08 VITALS — BP 113/76 | HR 77 | Temp 97.5°F | Ht 60.0 in | Wt 210.0 lb

## 2005-05-08 LAB — UA, CHEM ONLY POCT
Glucose: NEGATIVE
Nitrite: NEGATIVE
Specific Gravity: 1.025 (ref 1.002–1.030)
Urobilinogen: 0.2 U/dL (ref 0.2–1.0)
pH: 5 (ref 5–8)

## 2005-05-08 MED ORDER — DEPO-PROVERA 150 MG/ML IM SUSP
INTRAMUSCULAR | Status: DC
Start: ? — End: 2005-12-21

## 2005-05-08 MED ORDER — MACROBID 100 MG OR CAPS
ORAL_CAPSULE | ORAL | Status: AC
Start: 2005-05-08 — End: 2005-05-15

## 2005-05-08 MED ORDER — PYRIDIUM 200 MG OR TABS
ORAL_TABLET | ORAL | Status: DC
Start: 2005-05-08 — End: 2005-11-04

## 2005-05-11 ENCOUNTER — Telehealth (INDEPENDENT_AMBULATORY_CARE_PROVIDER_SITE_OTHER): Payer: Self-pay | Admitting: Family Medicine

## 2005-10-23 ENCOUNTER — Ambulatory Visit (INDEPENDENT_AMBULATORY_CARE_PROVIDER_SITE_OTHER): Admitting: Family Medicine

## 2005-10-23 VITALS — BP 115/75 | HR 101 | Temp 98.8°F | Wt 220.0 lb

## 2005-10-23 NOTE — Progress Notes (Signed)
Chief Complaint   Patient presents with    Pain     c/o shooting pain from hip to ankle when walking x 7 months           Subjective:  Ruth Hall is a 20 year old female who  is here for 7-8 months of worsening bilateral hip pain that came on suddenly "for no reason". Pain grade 6 or 7 out of 10. The pain is a sharp, shooting pain that shoots down into her leg, down to her ankle. Pt thinks it might be related to wt gain from depo provera. She describes herself as normally very active during the day and walks about 15 minutes from her car to class at her college. She is currently able to walk about 5-10 minutes before she has to stop from the hip pain. No hip pain at rest. Pt states it's hard for her to stay bent over for a while and then get up. No history of recent trauma/falls. Denies SOB, fevers, chills. Pt has been having night sweats for the past two weeks which is not normal for her, but she attributed it to the hot weather.  Pt has gained about 50-60 pounds over the past 18 months. She is not taking any meds for the pain. Pt states that she remembers that as a child, her pediatrician told her she had a hip problem but does not remember exactly what it was called. The pediatrician told her she could have hip surgery or just wait because the problem would get better with time. Now she is concerned that the hip pain may be related to her pediatric hip condition.       Pt had surgery a yr ago to remove gallbladder. For about a month and a half, she has been having the same type of pain that she used to get from the gallstones. Pain occurs after she eats meat or pasta, last about a minute, then goes away. Her gallbladder doctor told her that the surgery may not fully alleviate the pain but that the stones would not recur. She is not taking any pain meds for the pain.               Objective  BP 115/75   Pulse 101   Temp (Src) 98.8 (Oral)   Wt 220 lbs (99.8kg)   LMP Injection   Breastfeeding? No  General  appearance: WDWN obese, young, hispanic female in NAD  Head; normocephalic, atraumatic. PERLA. Mucous mb's moist. Neck soft and supple.   Respiratory exam reveals:CTAB, no wheezes, crackles, rales  CVS exam: RRR, no murmurs, clicks, rubs, gallops  Abdominal exam: soft, nontender, good bowel sounds. Mild R cva tenderness.   Lower extremities: no swelling/edema. Good peripheral pulses.  Back: R>>L lumbar back tenderness to palpation     Results for orders placed on 05/08/2005   UA, CHEM ONLY POCT ([facility])   Component Value Range    COLOR, UA yellow  -     APPEARANCE, UA clear  -     GLUCOSE, UA neg  -Neg     BILIRUBIN, UA neg  -Neg     KETONES, UA trace (*) -Neg     SPECIFIC GRAVITY, UA 1.025  1.002-1.030     BLOOD, UA 1+ (*) -Neg     PH,UA 5  5-8     PROTEIN, UA neg  -Neg     UROBILINOGEN, UA 0.2  0.2-1.0 (EU/dl)    NITRITE, UA neg  -  Neg     LEUK ESTERASE, UA neg  -Neg    URINE CULTURE   Component Value Range    URINE CULTURE **FINAL**  -            Assessment and Plan:  20 yo hispanic female with history of congenital hip condition and gallstones status post gallbladder removal presents with 7 months of bilat hip pain when walking and mild epigastric pain after eating meats and pasta.   Encounter Diagnoses   Code Name Primary?    719.45 Hip Pain- we are going to image the hips to check for any dysplasia or joint narrowing. PT to work with pt on relieving back and hip pain and to strengthen back muscles.  Yes    Plan: X-RAY HIPS 4 VW + PELVIS, CONSULT/REFERRAL TO PHYSICAL THERAPY    V82.9 Screening for Condition- checking GTT to r/o db causing her wt gain. tsh level to r/o wt gain secondary to hyopthyroidism      Plan: GTT 2HR, BLOOD, TSH SENSITIVE, BLOOD    724.5 Backache- pt. States hip and back pain are related (starts at back and goes down hip and leg)      Plan: X-RAY HIPS 4 VW + PELVIS, CONSULT/REFERRAL TO PHYSICAL THERAPY

## 2005-10-26 ENCOUNTER — Other Ambulatory Visit (INDEPENDENT_AMBULATORY_CARE_PROVIDER_SITE_OTHER): Payer: Self-pay | Admitting: Family Medicine

## 2005-11-04 DIAGNOSIS — M25559 Pain in unspecified hip: Secondary | ICD-10-CM

## 2005-11-04 DIAGNOSIS — M549 Dorsalgia, unspecified: Secondary | ICD-10-CM

## 2005-11-04 NOTE — Progress Notes (Signed)
Chief Complaint   Patient presents with   . Pain     c/o shooting pain from hip to ankle when walking x 7 months       Ruth Hall is a 20 year old female is here for pain.    Issues discussed today as follows:    1. Patient complains of 7-8 months of worsening bilateral hip pain that came on suddenly "for no reason". Pain grade 6 or 7 out of 10. The pain is a sharp, shooting pain that shoots down into her leg, down to her ankle. She is aware that she needs to lose weight and she thinks she has gained a lot of weight from Depo shot. She describes herself as normally very active during the day and walks about 15 minutes from her car to class at her college. She is currently able to walk about 5-10 minutes before she has to stop from the hip pain. No hip pain at rest. Pt states it's hard for her to stay bent over for a while and then get up. No history of recent trauma/falls. Denies SOB, fevers, chills. Pt has been having night sweats for the past two weeks which is not normal for her, but she attributed it to the hot weather.  Pt has gained about 50-60 pounds over the past 18 months. She is not taking any meds for the pain. Pt states that she remembers that as a child, her pediatrician told her she had a hip problem but does not remember exactly what it was called. The pediatrician told her she could have hip surgery or just wait because the problem would get better with time. Now she is concerned that the hip pain may be related to her pediatric hip condition.       2. Pt had surgery a yr ago to remove gallbladder. For about a month and a half, she has been having the same type of pain that she used to get from the gallstones. Pain occurs after she eats meat or pasta, last about a minute, then goes away. Her gallbladder doctor told her that the surgery may not fully alleviate the pain but that the stones would not recur. She is not taking any pain meds for the pain.       Objective  BP 115/75  Pulse 101   Temp (Src) 98.8 (Oral)  Wt 220 lbs (99.8kg)  LMP Injection  Breastfeeding? No  General appearance: WDWN obese, young, hispanic female in NAD  Head; normocephalic, atraumatic. PERLA. Mucous mb's moist. Neck soft and supple.   Respiratory exam reveals:CTAB, no wheezes, crackles, rales  CVS exam: RRR, no murmurs, clicks, rubs, gallops  Abdominal exam: soft, nontender, good bowel sounds. Mild R cva tenderness.   Lower extremities: no swelling/edema. Good peripheral pulses.  Back: R>>L lumbar back tenderness to palpation       ASSESSMENT and PLAN:    Encounter Diagnoses   Code Name Primary?   . 719.45 Hip Pain- we are going to image the hips to check for any dysplasia or joint narrowing. PT to work with pt on relieving back and hip pain and to strengthen back muscles. Stressed importance of weight loss. Yes    Plan: X-RAY HIPS 4 VW + PELVIS, CONSULT/REFERRAL TO PHYSICAL THERAPY     . V82.9 Screening for Condition- checking GTT to r/o db causing her wt gain. tsh level to r/o wt gain secondary to hyopthyroidism; +FHX of hypothyroidism     Plan: GTT  2HR, BLOOD, TSH SENSITIVE, BLOOD     . 724.5 Backache- pt. States hip and back pain are related (starts at back and goes down hip and leg)      Plan: X-RAY HIPS 4 VW + PELVIS, CONSULT/REFERRAL TO PHYSICAL THERAPY     Not interested in changing contraception at this time despite relation to weight gain.

## 2005-11-19 ENCOUNTER — Telehealth (INDEPENDENT_AMBULATORY_CARE_PROVIDER_SITE_OTHER): Payer: Self-pay | Admitting: Family Medicine

## 2005-11-19 NOTE — Telephone Encounter (Signed)
PT HAD LAB WORK 3 WEEKS AGO,PT CALLING FOR RESULTS,TO PLEASE CALL PT AT CELL# [phone],

## 2005-11-20 NOTE — Telephone Encounter (Signed)
LABS ARE IN

## 2005-11-20 NOTE — Telephone Encounter (Signed)
Labs and x-ray reviewed. All normal although glucose TT could be better (but no values above 200).    Called patient at   [phone]    And gave her the results.

## 2005-12-20 ENCOUNTER — Encounter (INDEPENDENT_AMBULATORY_CARE_PROVIDER_SITE_OTHER): Payer: Self-pay | Admitting: Family Medicine

## 2005-12-21 ENCOUNTER — Encounter (INDEPENDENT_AMBULATORY_CARE_PROVIDER_SITE_OTHER): Payer: Self-pay | Admitting: Family Medicine

## 2005-12-21 ENCOUNTER — Ambulatory Visit (INDEPENDENT_AMBULATORY_CARE_PROVIDER_SITE_OTHER): Admitting: Family Medicine

## 2005-12-21 VITALS — BP 117/63 | HR 80 | Temp 98.2°F | Resp 20 | Ht 60.0 in | Wt 227.0 lb

## 2005-12-21 MED ORDER — ORTHO TRI-CYCLEN LO 0.025 MG OR TABS
ORAL_TABLET | ORAL | Status: DC
Start: ? — End: 2006-03-05

## 2005-12-21 NOTE — Progress Notes (Signed)
Ruth Hall is a 20 year old female who presents with    Chief Complaint   Patient presents with   . Derm Problem     spot on right leg        Non-painful spot on (R) leg since July.  It has grown.  Not puritic.   MGM with cancer hx but not skin.  Has some sun hx - burns more than tans.    Problem list was updated:    Patient Active Problem List   Diagnoses Date Noted   . MORBID OBESITY [278.01] 11/04/2005     GTT wnl 7/07.  Likely pre-diabetic, however - high fasting insulin 7/05.  Taking depo-provera and has gained wt with this too   . Hip Pain [719.41F] 11/04/2005     Neg x-rays 8/07   . Backache [724.5T] 11/04/2005       HCM:  No recent lipid labs have been done    Obesity:  Discussed nutrition referral.  Pt feels that these haven't helped - it was done a while back and she didn't get much out of it.    Current outpatient prescriptions prior to 12/21/05   Medication Sig Dispense Refill   . IBUPROFEN 200 MG OR CAPS 1 CAPSULE EVERY 4 TO 6 HOURS AS NEEDED       . DISCONTD: DEPO-PROVERA 150 MG/ML IM SUSP 1 inj every 12 weeks       . CLOTRIMAZOLE 1 % EX CREA apply twice daily to external rash for 10-14 days  one large tube  1       History: History was reviewed and updated   History   Substance Use Topics   . Tobacco Use: Never   . Alcohol Use: Not on file         Family History   Problem Relation   . Diabetes Neg Hx   . Cancer Maternal Grandmother     GI cancer, also in 2 aunts   . Cancer Neg Hx     skin   . Cancer Neg Hx     colon   . Heart Neg Hx   . Stroke Neg Hx       ROS: Review of systems is positive as noted above.      OBJECTIVE:  BP 117/63  Pulse 80  Temp (Src) 98.2 (Oral)  Resp 20  Ht 5\' 0"  (1.30m)  Wt 227 lbs (103.0kg)  LMP 11/20/2005  Body mass index is 44.33 kg/(m^2).  PE:  WDWN, NAD, A/O x 4      HEENT:  PERRLA, EOMI      Heart: RRR no m/g/r      Lungs: CTA(B)      Abd:    soft, NT, ND, +BS      Ext:     palp DP, PT (B), (B) radial pulses      Skin:  Legs, arms and face checked.  Lesion  in question is superficial irregular hyperpigmented reddish brown approx 8 mm across on (R) calf, is slightly raised.    ASSESSMENT AND PLAN:    1.  Lesion concerning for atypical nevus vs superficial spreading melanoma.  Bx done today per below    2.  HCM:  Lipid panel.  May need tetanus shot but deferred today due to pt's aversion to needles    3.  Obesity.  Discussed diet at length.  Pt not open right now to nutrition referral.  States only eats "1 meal /  day".  She has stopped depo- but has still gained much wt in last year.  This is a major long term concern.  Will suggest nutrition at each visit - try to open up her mind a bit more that it's about calories in and calories out.    Encounter Diagnoses   Code Name Primary?   . V77.91 Screening for Lipoid Disorders Yes    Plan: LIPID(CHOL FRACT) PANEL, BLOOD   . 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 278.01 Morbid Obesity        After informed written consent was obtained, using Betadine for cleansing and 1% Lidocaine with epinephrine for anesthetic, with sterile technique a 3 mm punch biopsy was used to obtain a biopsy specimen of the lesion. Hemostasis was obtained by pressure  and wound was not sutured. Antibiotic dressing is applied, and wound care instructions provided. Be alert for any signs of cutaneous infection. The specimen is labeled and sent to pathology for evaluation. The procedure was well tolerated without complications.    Please call patient with results at  [phone]

## 2005-12-21 NOTE — Patient Instructions (Signed)
What to Expect  Prior to Procedure - No significant preparation is required for this procedure    During Procedure - Cleaning of biopsy area; anesthesia    Anesthesia - Local    Description of the Procedure    Shave biopsy: With a sharp scalpel or razor blade, the doctor removes a thin slice of the outer area of the abnormal skin tissue.    Punch biopsy: The skin around the abnormal area is pulled taught. A hollow punch instrument is pushed into the center of the lesion in the skin, rotated to remove a sample of skin, and then removed. This type of biopsy provides a sample containing cells from all of the layers of the skin.    Excision biopsy: This type of biopsy is larger and deeper than the other two. The entire skin lesion is removed, as well as some extra normal tissue around the outside of the lesion.    Depending on the amount of skin removed, the area may be closed with stitches and/or a sterile dressing. Facial stitches will be removed in 3-5 days; stitches on the arms and legs are removed in 7-14 days.    After Procedure - Stitches and bandages are applied as needed    How Long Will It Take?   5-20 minutes    Will It Hurt?   There may be some temporary pain and discomfort during this procedure, but this will subside when the procedure is completed.    Possible Complications:  Infection  -  People who scar easily may form keloids (raised scar tissue)  -  Nerve damage    Postoperative Care:    Keep biopsy area clean and dry, and covered with a sterile bandage or gauze dressing for 1-2 days  Take pain medication if necessary  Avoid hottubs while healing is occurring.  You can get the area moist again after 1-2 days, but dry it off and rebandage as necessary after bathing.    Outcome  The removed skin tissue is analyzed at a laboratory and the results are given to your doctor within a few days. In the lab, the biopsy will be defined as either normal or abnormal. Abnormal results may indicate any of the  following:       Presence of bacteria or fungi    Inflammation of the skin    Benign (noncancerous) skin condition    Skin cancer    You will get a letter from us informing you of the results.    Call Your Doctor If Any of the Following Occurs  - Signs of infection, including fever and chills  - Redness, swelling, increasing pain, excessive bleeding, or discharge from the incision site

## 2005-12-23 ENCOUNTER — Other Ambulatory Visit (INDEPENDENT_AMBULATORY_CARE_PROVIDER_SITE_OTHER): Payer: Self-pay | Admitting: Family Medicine

## 2005-12-23 ENCOUNTER — Encounter (INDEPENDENT_AMBULATORY_CARE_PROVIDER_SITE_OTHER): Payer: Self-pay | Admitting: Family Medicine

## 2005-12-23 DIAGNOSIS — E781 Pure hyperglyceridemia: Secondary | ICD-10-CM

## 2005-12-24 NOTE — Progress Notes (Signed)
Attending Note:    Subjective:  I reviewed the history.  Patient interviewed and examined.  History of present illness (HPI):  Derm Problem     Review of Systems (ROS): As per  the resident's note.  Past Medical, Family, Social History:  As per  the resident's  note.    Objective:   I have examined the patient and I concur with the resident's exam.    Assessment and plan reviewed with the resident physician.  I agree with the resident's plan as documented.    See the resident's note for further details.

## 2005-12-28 ENCOUNTER — Encounter (INDEPENDENT_AMBULATORY_CARE_PROVIDER_SITE_OTHER): Payer: Self-pay | Admitting: Family Medicine

## 2005-12-28 DIAGNOSIS — L989 Disorder of the skin and subcutaneous tissue, unspecified: Secondary | ICD-10-CM

## 2006-03-05 ENCOUNTER — Ambulatory Visit (INDEPENDENT_AMBULATORY_CARE_PROVIDER_SITE_OTHER): Admitting: Family Medicine

## 2006-03-05 ENCOUNTER — Encounter (INDEPENDENT_AMBULATORY_CARE_PROVIDER_SITE_OTHER): Payer: Self-pay | Admitting: Family Medicine

## 2006-03-05 MED ORDER — ORTHO-CYCLEN (28) 0.25-35 MG-MCG OR TABS
ORAL_TABLET | ORAL | Status: DC
Start: ? — End: 2006-05-14

## 2006-03-05 MED ORDER — DIPHTHERIA-TETANUS TOXOIDS 2-5 LFU IM INJ
INJECTION | INTRAMUSCULAR | Status: AC
Start: 2006-03-05 — End: 2006-03-06

## 2006-03-12 ENCOUNTER — Other Ambulatory Visit (INDEPENDENT_AMBULATORY_CARE_PROVIDER_SITE_OTHER): Payer: Self-pay | Admitting: Family Medicine

## 2006-03-25 ENCOUNTER — Telehealth (INDEPENDENT_AMBULATORY_CARE_PROVIDER_SITE_OTHER): Payer: Self-pay | Admitting: Family Medicine

## 2006-03-30 ENCOUNTER — Telehealth (INDEPENDENT_AMBULATORY_CARE_PROVIDER_SITE_OTHER): Payer: Self-pay | Admitting: Family Medicine

## 2006-03-31 ENCOUNTER — Telehealth (INDEPENDENT_AMBULATORY_CARE_PROVIDER_SITE_OTHER): Payer: Self-pay | Admitting: Family Medicine

## 2006-04-02 ENCOUNTER — Other Ambulatory Visit (INDEPENDENT_AMBULATORY_CARE_PROVIDER_SITE_OTHER): Payer: Self-pay | Admitting: Family Medicine

## 2006-04-12 ENCOUNTER — Telehealth (INDEPENDENT_AMBULATORY_CARE_PROVIDER_SITE_OTHER): Payer: Self-pay | Admitting: Family Medicine

## 2006-04-12 ENCOUNTER — Other Ambulatory Visit (INDEPENDENT_AMBULATORY_CARE_PROVIDER_SITE_OTHER): Payer: Self-pay | Admitting: Family Medicine

## 2006-04-12 NOTE — Telephone Encounter (Signed)
This pt is walking into clinic after leaving lab due to orders not being in system. Pt was to come in today for recheck.    Elevated liver enzymes

## 2006-04-12 NOTE — Telephone Encounter (Signed)
Thank you :)

## 2006-04-12 NOTE — Telephone Encounter (Signed)
Orders placed for the MD.

## 2006-04-13 ENCOUNTER — Telehealth (INDEPENDENT_AMBULATORY_CARE_PROVIDER_SITE_OTHER): Payer: Self-pay | Admitting: Family Medicine

## 2006-04-13 NOTE — Telephone Encounter (Signed)
LFTs back to normal after being off the phentermine x1 week.      Lindy wants to go back on phentermine x1 more month.  I advised against this, but stated she should speak with the prescribing doctor, now that she has these latest results.    She will do so and let me know what they decide.  If she does go back on, I recommend another liver panel 2weeks after starting.

## 2006-05-14 ENCOUNTER — Ambulatory Visit (INDEPENDENT_AMBULATORY_CARE_PROVIDER_SITE_OTHER): Admitting: Emergency Medicine

## 2006-05-14 ENCOUNTER — Other Ambulatory Visit (INDEPENDENT_AMBULATORY_CARE_PROVIDER_SITE_OTHER): Payer: Self-pay | Admitting: Emergency Medicine

## 2006-05-14 ENCOUNTER — Telehealth (INDEPENDENT_AMBULATORY_CARE_PROVIDER_SITE_OTHER): Payer: Self-pay | Admitting: Family Medicine

## 2006-05-14 LAB — LIVER PANEL, BLOOD
ALT (SGPT): 29 IU/L (ref 10–45)
AST (SGOT): 19 IU/L (ref 10–45)
Albumin: 4 gm/dL (ref 3.3–5.0)
Alkaline Phos: 84 IU/L (ref 30–130)
Bilirubin, Dir: 0.2 mg/dL (ref <0.2)
Bilirubin, Tot: 0.5 mg/dL (ref <1.2)
Total Protein: 7.2 gm/dL (ref 6.0–8.0)

## 2006-05-14 NOTE — Progress Notes (Signed)
Ruth Hall is a 21 year old female with a 6 month history of pain in the right mid-back.  In the last month it has become present throughout most of the day.  Movements exacerbate and nothing but lying down makes it go away.  She walks an hour a day for exercise adn for wt loss purposes.  She takes Phenteramine as part of wt loss program.  She recalls no injury.  She does have a 2 yo son, who she used to lift frequently.  Never had this problem before.  No cough, back pain, fever, NV, SOB, or any other sxs.    PE:  BP 114/78  Pulse 70  Temp (Src) 97.9 F (36.6 C) (Oral)  Wt 197 lb (89.359 kg)  Breastfeeding? No  Lungs are clear to ausc.  CV:  RRR w/o murmer.  Back:  Tender to palpation over right mid-back.  No muscle spasm.  NT in midline.    .  Encounter Diagnoses   Code Name Primary? 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  Referred to PT.  Will call patient with plan.

## 2006-05-14 NOTE — Telephone Encounter (Signed)
OPEN ERROR

## 2006-05-14 NOTE — Patient Instructions (Signed)
Avoid movements that cause pain.      I will call you with results of  X ray.

## 2006-05-20 ENCOUNTER — Telehealth (INDEPENDENT_AMBULATORY_CARE_PROVIDER_SITE_OTHER): Payer: Self-pay | Admitting: Emergency Medicine

## 2006-05-20 NOTE — Telephone Encounter (Signed)
Forwarding to Dr Catha Nottingham to interpret xray

## 2006-05-20 NOTE — Telephone Encounter (Signed)
Patient would like to be called with xray results and labwork.

## 2006-06-07 ENCOUNTER — Ambulatory Visit (INDEPENDENT_AMBULATORY_CARE_PROVIDER_SITE_OTHER): Admitting: Family Medicine

## 2006-06-07 VITALS — BP 115/79 | HR 106 | Temp 97.0°F | Ht 60.0 in | Wt 188.0 lb

## 2006-06-07 MED ORDER — KETOCONAZOLE 2 % EX CREA
TOPICAL_CREAM | CUTANEOUS | Status: DC
Start: 2006-06-07 — End: 2006-09-09

## 2006-06-07 NOTE — Patient Instructions (Addendum)
Please call physical therapy to make appointment:    Physical Therapy phone numbers   [facility] [place]: [phone]     =======================  Consider HPV (Gardisil) vaccine.
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
## 2006-06-10 NOTE — Telephone Encounter (Signed)
Calle pt and discussed results.  She will follow up in office.

## 2006-08-22 ENCOUNTER — Telehealth (INDEPENDENT_AMBULATORY_CARE_PROVIDER_SITE_OTHER): Payer: Self-pay

## 2006-09-04 ENCOUNTER — Telehealth (INDEPENDENT_AMBULATORY_CARE_PROVIDER_SITE_OTHER): Payer: Self-pay | Admitting: Family Medicine

## 2006-09-04 MED ORDER — CIPROFLOXACIN HCL 0.3 % OP SOLN
OPHTHALMIC | Status: DC
Start: 2006-09-04 — End: 2011-10-07

## 2006-09-04 NOTE — Telephone Encounter (Signed)
On call note:  Pt called with 3 days of eye redness, hurts to blink, +yellow discharge, +crust in the morning. No redness or swelling in eye lid. Pts family members all diagnosed with pink eye and on abx. Pt thinks pain is worsening today. Discussed no abx over the phone. Pt is at work and cannot go to urgent care. Will make exception but advised that cannot diagnose over the phone. Discussed isgns of periorbital cellulitits. Will call in abx eye drops. Advised that if any worsening of sxs tonight or tomarrow. Must be evaluated in ER. Will f/u with Dr. Dorna Bloom next week.

## 2006-09-09 ENCOUNTER — Ambulatory Visit (INDEPENDENT_AMBULATORY_CARE_PROVIDER_SITE_OTHER): Admitting: Family Medicine

## 2006-09-09 VITALS — BP 111/72 | HR 82 | Temp 97.2°F | Wt 184.0 lb

## 2006-09-09 LAB — PB VISION SCREENING FOR ILLNES OR INJURY

## 2006-09-09 MED ORDER — TOBRAMYCIN SULFATE 0.3 % OP SOLN
OPHTHALMIC | Status: DC
Start: 2006-09-09 — End: 2011-10-07

## 2006-09-09 NOTE — Progress Notes (Signed)
Best number to reach patient [phone]

## 2006-09-09 NOTE — Patient Instructions (Addendum)
Stop Cipro.  Start new eye drops as prescribed.  Call in AM if symptoms persist, and we will help you arrange opthamology consultation.      Off work x 24 hrs.

## 2006-09-09 NOTE — Progress Notes (Addendum)
Addended by: Theressa Stamps on: 09/09/2006 7:49:27 PM     Modules accepted: Orders

## 2006-09-09 NOTE — Progress Notes (Signed)
21yo with eye complaint x 8 days.  2yo son diagnosed with pink eye, treated with Cipro drops, improved within 2-3 days.  Patient started with similar symptom, right eye redness and irritation, called in to primary MD, not examined, prescribed Cipro drops by phone.  Has been using for eight days, symptoms worsening, now spreading to left eye.  Light hurts her eyes x 3 days.  +Discharge both eyes, treen-yellow color, in morning eyes are matted shut 'with pus.'  Hurts for eyeball to move.  +Sore throat, right ear.      I reviewed the patient's medical record, including PMH, SH, medications, allergies, and ROS.    Patient Active Problem List   Diagnoses Code   . MORBID OBESITY 278.01   . Hip Pain 719.8F   . Backache 724.5T   . HYPERTRIGLYCERIDEMIA 272.1AH   . SKIN LESION 709.9G       Current outpatient prescriptions prior to encounter   Medication Sig Dispense Refill   . CIPROFLOXACIN HCL 0.3 % OP SOLN 2 drops 4x/day, continue for 2 days after symptoms improve  5 ml  0       Review of Systems -   Constitutional: negative.  Eyes: red eyes, eye pain.  Ears, Nose, Mouth, Throat: negative.  CV: negative.  Resp: negative.    Filed Vitals:    09/09/2006  7:17 PM   BP: 111/72   Pulse: 82   Temp: 97.2 F (36.2 C)   TempSrc: Oral   Weight: 184 lb (83.462 kg)       Physical Exam:   General Appearance: healthy, alert, no distress, pleasant affect, cooperative.  Eyes:  PERRL, EOM's intact. Bilateral conjunctivae injected.  No foreign body noted.  Ears:  normal TMs and canal.  Nose:  normal and clear rhinorrhea.  Mouth: normal.  Neck:  Neck supple. No adenopathy, thyroid symmetric, normal size.  Heart:  normal rate and regular rhythm, no murmurs, clicks, or gallops.  Lungs: clear to auscultation and percussion, no chest deformities noted.    Encounter Diagnoses   Code Name Primary? Qualifier   . 372.30 Conjunctivitis Yes     Plan: D/c Cipro, possible allergic component, given worsening of erythema and irritation over 8 days.   Start Tobramycin.  If no improvement in 24 hrs, will recommend Optho evaluation.  TOBRAMYCIN SULFATE 0.3 % OP SOLN

## 2006-09-10 ENCOUNTER — Telehealth (INDEPENDENT_AMBULATORY_CARE_PROVIDER_SITE_OTHER): Payer: Self-pay | Admitting: Family Medicine

## 2006-09-10 NOTE — Telephone Encounter (Signed)
I called the patient to follow up on the eye.  Pt states that it looks as though a blood vessel broke but the pain is gone.  The redness of the eye has not improved or worsened.  Pt was told that if the pain returns, to go to an urgent care center, other wise, please call me on Monday and give me a progress report.

## 2006-09-10 NOTE — Telephone Encounter (Signed)
Noted  

## 2006-12-24 ENCOUNTER — Ambulatory Visit (INDEPENDENT_AMBULATORY_CARE_PROVIDER_SITE_OTHER): Admitting: Family Medicine

## 2007-01-24 ENCOUNTER — Telehealth (INDEPENDENT_AMBULATORY_CARE_PROVIDER_SITE_OTHER): Payer: Self-pay | Admitting: Family Medicine

## 2007-01-24 ENCOUNTER — Ambulatory Visit (INDEPENDENT_AMBULATORY_CARE_PROVIDER_SITE_OTHER): Admitting: Family Medicine

## 2007-01-28 NOTE — Telephone Encounter (Signed)
 Patient has failed to show for appointment on October 27th.    Please review chart and disposition for follow up instructions.    Attached is the letter to patient to reschedule PRN and no show policy.       For second No show letter, Please route to [facility] for official notification.     If patient needs to be contacted for immediate reschedule Please forward this message to your Medical Assistant/LVN so they can call the patient.

## 2007-02-09 ENCOUNTER — Telehealth (INDEPENDENT_AMBULATORY_CARE_PROVIDER_SITE_OTHER): Payer: Self-pay | Admitting: Family Medicine

## 2007-02-09 NOTE — Telephone Encounter (Signed)
Attempted to contact pt.regarding Health Maintenance Initiative. Will write letter to ask pt.to make an appt.for [facility]. Caron Presume RN

## 2007-02-14 NOTE — Telephone Encounter (Signed)
 Letter written and sent. Melony Squibb RN

## 2007-02-22 ENCOUNTER — Encounter (INDEPENDENT_AMBULATORY_CARE_PROVIDER_SITE_OTHER): Admitting: Family Medicine

## 2007-08-08 ENCOUNTER — Ambulatory Visit (INDEPENDENT_AMBULATORY_CARE_PROVIDER_SITE_OTHER): Admitting: Specialist

## 2007-08-08 VITALS — BP 125/86 | HR 84 | Temp 97.6°F | Resp 20 | Ht 60.0 in | Wt 184.0 lb

## 2007-08-08 NOTE — Progress Notes (Signed)
 [facility] Progress Note    Subjective: Saga Balthazar is a 22 year old yo female with a h/o c-sxn 3 years ago who is here for a bump in her scar that has been present for about 9 months. The bump is painful and swells. It is not painful or swollen right now. No fevers or chills. No abd pain. No muscular pain.    Objective:    Medications:     Current outpatient prescriptions: TOBRAMYCIN  SULFATE 0.3 % OP SOLN, 1-2 gtt OU q4 hrs while awake, Disp: one bottle, Rfl: 0;  CIPROFLOXACIN  HCL 0.3 % OP SOLN, 2 drops 4x/day, continue for 2 days after symptoms improve, Disp: 5 ml, Rfl: 0     Exam:  Vitals: Blood pressure 125/86, pulse 84, temperature 97.6 F (36.4 C), temperature source Oral, resp. rate 20, height 5' (1.524 m), weight 184 lb (83.462 kg), last menstrual period 07/14/2007.  Gen: obese female, NAD  Abdomen: well healed C sxn scar. Non-inflamed epidermoid cyst approx 0.5cm just inferior to the scar on the R. Noninfected appearing. No e/o hernia.     Recent Labs/Studies:  none    Assessment: This is a 22 year old yo female here for lump at the sit of her old csxn scar, most consistent with epidermoid cyst.    Plan:  I have educated the patient that if it is bothering her then we can remove it. Otherwise, I have reassured her that it is not dangerous. Ice packs when painful. Motrin  when painful. Patient wishes to forgo removal at this time.    Thank you,  Anner Bars, MD

## 2007-08-09 NOTE — Progress Notes (Signed)
 Attending Note:                                                                   Subjective:  I discussed the case with the resident at the time of the clinical session.    I reviewed the entire history.  History of present illness (HPI): Derm Problem              Objective:  I reviewed the resident's examination findings.  The test results were reviewed with the resident.    Assessment and plan reviewed, discussed and finalized with the resident.   I agree with the resident's plan as documented.    See the resident physician's note for further details.

## 2009-08-16 ENCOUNTER — Telehealth (INDEPENDENT_AMBULATORY_CARE_PROVIDER_SITE_OTHER): Payer: Self-pay | Admitting: Family Medicine

## 2010-01-08 NOTE — Lab (Signed)
FINAL PATHOLOGIC DIAGNOSIS:  A: Right leg skin, biopsy   -Purpuric dermatitis, see comment.  COMMENT: Many superficial perivascular lymphocytic infiltrates with focal  extravasation of red blood cells consistent with purpuric dermatitis. No  evidence of melanocytic proliferation. The differential diagnosis includes  drug eruption, id reaction, or organisms. However, GMS stain is negative  for fungi. This case was reviewed interdepartmentally on 12/24/05.  SPECIMEN(S) SUBMITTED: A: Skin, right leg  CLINICAL HISTORY: ICD9 Code: 216.9. Pigmented superficial nevus, right  leg, of two months' duration.  GROSS DESCRIPTION: A: The specimen (received in formalin, labeled with the  patient's name, medical record number only) consists of a core of tan skin  and subcutaneous tissue measuring 0.5 cm in length with a diameter of 0.3  cm. The specimen is entirely submitted in tissue paper in cassette A1.  WN/SD/vl  SPECIAL STAINS/PROCEDURES: The following special stains and/or procedures  were used in the final interpretation. A GMS stain was performed on block  A1 with appropriate controls.  CONFIDENTIAL HEALTH INFORMATION: Health Care information is personal and  sensitive information. If it is being faxed to you it is done so under  appropriate authorization from the patient or under circumstances that do  not require patient authorization. You, the recipient, are obligated to  maintain it in a safe, secure and confidential manner. Re-disclosure  without additional patient consent or as permitted by law is prohibited.  Unauthorized re-disclosure or failure to maintain confidentiality could  subject you to penalties described in federal and state law. If you have  received this report or facsimile in error, please notify the [facility]  [facility] Department immediately and destroy the received document(s).   Material reviewed and Interpreted and  Report Electronically Signed by:  Kingsley Spittle M.D. [phone])  Attending Surgical  Pathologist  12/25/05 14:11  Electronic Signature derived from a single  controlled access password

## 2010-05-05 ENCOUNTER — Encounter (INDEPENDENT_AMBULATORY_CARE_PROVIDER_SITE_OTHER): Payer: Self-pay | Admitting: Family Medicine

## 2010-05-05 ENCOUNTER — Ambulatory Visit (INDEPENDENT_AMBULATORY_CARE_PROVIDER_SITE_OTHER): Admitting: Family Medicine

## 2010-05-05 VITALS — BP 123/79 | HR 94 | Temp 99.3°F | Resp 16 | Ht 60.5 in | Wt 168.5 lb

## 2010-05-05 MED ORDER — NORELGESTROMIN-ETH ESTRADIOL 150-20 MCG/24HR TD PTWK
1.0000 | MEDICATED_PATCH | TRANSDERMAL | Status: DC
Start: 2010-05-05 — End: 2011-10-07

## 2010-05-05 MED ORDER — PHENTERMINE HCL 37.5 MG OR TABS
37.50 mg | ORAL_TABLET | Freq: Every day | ORAL | Status: DC
Start: ? — End: 2014-11-29

## 2010-05-05 NOTE — Progress Notes (Signed)
Pt is a 25 yo female, presents for annual exam.    LMP: Patient's last menstrual period was 04/18/2010.    Menstrual history notable for: --    History of abnormal pap smears?: no  Last Pap was 2 year(s) ago and it was normal.  Past history of STD's?: no    Hormone Use?  Ortho-Evra patch  Significant Family History:   Family History   Problem Relation Age of Onset   . Diabetes Neg Hx    . Cancer Maternal Grandmother      GI cancer, also in 2 aunts   . Cancer Neg Hx      skin   . Cancer Neg Hx      colon   . Heart Neg Hx    . Stroke Neg Hx        HCM: annual physical exam with pap and pelvic exam  MMG: --  Lipids: --  Vaccines: declined  CRC: --  Bone Health: good  Diet / Exercise: good    Review of Systems -   Constitutional: no fatigue, night sweats, weight loss.  Ears, Nose, Mouth, Throat: no difficulty swallowing, persistent sore throat, loss of sense of smell.  CV: no palpitations, syncope, chest pain, orthopnea, lower extremity edema.  Resp: no cough, hemoptysis, shortness of breath.  GI:  no vomiting, nausea, heartburn or reflux, abdominal pain.  GU: no nocturia, dysuria, hesitancy.  Musculoskeletal: no joint swelling, joint pain.  Integumentary: no moles that have changed, rash.  +Keloid vs ingrown hair vs retained stitch periumbilical area  Neuro: no headaches, memory loss, numbness or tingling.  Psych: no abusive relationship.  Endo: no cold intolerance, heat intolerance, polyphagia, polydipsia, polyuria.  Heme/Lymphatic: no abnormal bleeding, abnormal bruising, swollen nodes.  Allergy/Immun: no itchy eyes, itchy nose, clear nasal secretions.    History   Substance Use Topics   . Smoking status: Never Smoker    . Smokeless tobacco: Not on file   . Alcohol Use: No     Filed Vitals:    05/05/10 1013   BP: 123/79   Pulse: 94   Temp: 99.3 F (37.4 C)   TempSrc: Oral   Resp: 16   Height: 5' 0.5" (1.537 m)   Weight: 76.431 kg (168 lb 8 oz)     Physical Exam:   General Appearance: healthy, alert, no distress,  pleasant affect, cooperative.  HEENT:  unremarkable  Neck:  Supple, no adenopathy, no thyromegaly.  Heart:  normal rate and regular rhythm, no murmurs, clicks, or gallops.  Lungs: clear to auscultation and percussion, no chest deformities noted.  Extremities:  no cyanosis, clubbing, or edema.  Breasts:  No masses, no nipple discharge, no axillary adenopathy.  Gyn:  Normal external female genitalia.  Speculum:  No discharge.  Bi-manual:  No CMT, uterus not enlarged  Skin:  No exanthem.  +Small keloid periumbilical area     ASSESSMENT/PLAN:    Ruth Hall was seen today for gyn exam, other and collect specimen.    Diagnoses and associated orders for this visit:    General medical exam    Keloid  - Plastic Surgery Clinic    Routine gynecological examination  - OBTAINING SCREEN PAP SMEAR  - Cytopath Gyn    Contraception  - norelgestromin-ethinyl estradiol (ORTHO EVRA) 150-20 MCG/24HR patch; Apply 1 patch topically every 7 days.    Bmi 32.0-32.9,adult  - Nutrition Clinic  - Weight Management Program (HMR)    Screening for lipoid disorders  - Lipid  Panel Green Plasma Separator Tube  - Comprehensive Metabolic Panel Green Plasma Separator Tube    Screening for diabetes mellitus  - Glycosylated Hgb(A1C), Blood Lavender    Screening for thyroid disorder  - TSH, Blood Green Plasma Separator Tube    Other Orders  - phentermine (ADIPEX-P) 37.5 MG tablet; Take 37.5 mg by mouth every morning (before breakfast).  - Cancel: Chlamydia/GC-Genital  - GFR

## 2010-05-09 ENCOUNTER — Telehealth (INDEPENDENT_AMBULATORY_CARE_PROVIDER_SITE_OTHER): Payer: Self-pay | Admitting: Family Medicine

## 2010-05-09 MED ORDER — FLUCONAZOLE 150 MG OR TABS
150.00 mg | ORAL_TABLET | Freq: Once | ORAL | Status: AC
Start: 2010-05-09 — End: 2010-05-09

## 2010-05-09 NOTE — Telephone Encounter (Signed)
Pt notified of rx being called into pharmacy.

## 2010-05-09 NOTE — Procedures (Signed)
 SPECIMENS SUBMITTED:  Cervical (Pap) Smear;SurePath Vial Received     CLINICAL INFORMATION:  LMP: 04/18/2010 Other Clinical Findings: Routine  FINAL CYTOLOGIC INTERPRETATION:  Atypical squamous cells - of undetermined significance  Fungal organisms morphologically consistent with Candida species.     COMMENT:  HPV Subtyping will be performed on excess Sure-Path collection fluid from  the specimen vial. Result will be reported in EPIC separately.     SPECIMEN ADEQUACY:  Satisfactory for evaluation  The Pap smear is a screening test with an inherent low error rate. Although  a normal [negative] result is highly predictive of the absence of cervical  cancer and its precursor lesions, it does not exclude the presence of  significant disease. Results must be evaluated within the context of the  individual patient.  CONFIDENTIAL HEALTH INFORMATION: Health Care information is personal and  sensitive information. If it is being faxed to you it is done so under  appropriate authorization from the patient or under circumstances that do  not require patient authorization. You, the recipient, are obligated to  maintain it in a safe, secure and confidential manner. Re-disclosure  without additional patient consent or as permitted by law is prohibited.  Unauthorized re-disclosure or failure to maintain confidentiality could  subject you to penalties described in federal and state law.  If you have  received this report or facsimile in error, please notify the [facility]  [facility] Department immediately and destroy the received document(s).   Material reviewed and Interpreted and  Report Electronically Signed by:  Robin Searing M.D. [phone])  Attending Surgical Pathologist  05/09/10 08:16  Electronic Signature derived from a single  controlled access password

## 2010-05-09 NOTE — Telephone Encounter (Signed)
Patient had WWE on 05-05-10 and was not asked if she was allergic to Latex which she is allergic to. That evening she developed a yeast infection. She tried to take the over the counter meds and it is still "bad".      [facility] [phone] -- corner of [address] [place] -- [place] -- [phone] -- [phone] -- [phone]

## 2010-05-09 NOTE — Telephone Encounter (Signed)
Pt states the day after her pap she started having yeast infection symptoms burning and itching pt states she purchased monistat cream and used it for the outer vag area and that it helped but does not want to insert cream in her vagina and would like Dr Shane Crutch to prescribe her Difulcan  Pt declined appt due to work schedule  Will forward to Dr Shane Crutch for review

## 2010-05-10 ENCOUNTER — Encounter (INDEPENDENT_AMBULATORY_CARE_PROVIDER_SITE_OTHER): Payer: Self-pay | Admitting: Family Medicine

## 2010-05-10 DIAGNOSIS — R896 Abnormal cytological findings in specimens from other organs, systems and tissues: Secondary | ICD-10-CM

## 2010-05-23 ENCOUNTER — Encounter (INDEPENDENT_AMBULATORY_CARE_PROVIDER_SITE_OTHER): Payer: Self-pay | Admitting: Family Medicine

## 2010-06-02 ENCOUNTER — Telehealth (INDEPENDENT_AMBULATORY_CARE_PROVIDER_SITE_OTHER): Payer: Self-pay | Admitting: Family Medicine

## 2010-06-02 NOTE — Telephone Encounter (Signed)
Spoke to patient. Patient is concerned is about the wait on colposcopy. Advised that ok to wait until scheduled time for colpo clinic. Explained that colpo is used to closely examine cervix and take samples to allow the doctor to create a plan for her (treatment vs time, etc) States understanding, but is still very concerned about waiting.

## 2010-06-02 NOTE — Telephone Encounter (Signed)
Patient is scheduled for a COLPO ON 06-16-10 and has some questions regarding the procedure. She received the letter from Dr. Shane Crutch and was concerned.

## 2010-06-05 ENCOUNTER — Telehealth (INDEPENDENT_AMBULATORY_CARE_PROVIDER_SITE_OTHER): Payer: Self-pay | Admitting: Family Medicine

## 2010-06-05 NOTE — Telephone Encounter (Signed)
Ruth Hall, a letter was written by Dr. Shane Crutch on 2/24 that details the pap smear results. If you don't feel comfortable explaining this to patient, then please forward to [facility] and/or Dr. Shane Crutch. Thanks.

## 2010-06-05 NOTE — Telephone Encounter (Signed)
Called patient and left message on machine for callback to discuss test results

## 2010-06-05 NOTE — Telephone Encounter (Signed)
I will forward this message to Dr Dorna Bloom. I assume patient is  Looking for results of labs and pap done 05/05/10- I am unable to provide results as it appears that pap,hpv may require further discussion with patient

## 2010-06-05 NOTE — Telephone Encounter (Signed)
Will forward message to triage nurse if results are all normal

## 2010-06-05 NOTE — Telephone Encounter (Signed)
Patient calling for lab results 05-05-10    No results letter in system with blood test results     Patient has been informed returned call will be by end of next business day.

## 2010-06-06 NOTE — Telephone Encounter (Signed)
 Called patient. Left v/m to call office.

## 2010-06-06 NOTE — Telephone Encounter (Signed)
Patient called back and states she received the letter from Dr Shane Crutch the day she called and that she assumed there may be an abnormality with pap because she had an abnormal result in the past. Patient verifies that she has confirmed an appointment for a colposcopy and has no further questions

## 2010-06-13 ENCOUNTER — Encounter (INDEPENDENT_AMBULATORY_CARE_PROVIDER_SITE_OTHER): Payer: Self-pay | Admitting: Family Medicine

## 2010-06-13 ENCOUNTER — Ambulatory Visit (INDEPENDENT_AMBULATORY_CARE_PROVIDER_SITE_OTHER): Admitting: Family Medicine

## 2010-06-13 VITALS — BP 159/104 | HR 123 | Temp 97.8°F | Resp 20 | Ht 60.0 in | Wt 164.0 lb

## 2010-06-13 NOTE — Patient Instructions (Signed)
 Colposcopy    What is a colposcopy?   A colposcopy is a way your doctor can examine your genitals, vagina and cervix closely. A colposcope is an instrument that shines a light on the cervix and magnifies the view for your doctor. At the beginning of the exam, you lie back and place your feet in the stirrups as you would for a Pap smear. Your doctor inserts a speculum into your vagina and opens it slightly so he or she can see your cervix. Then your doctor applies a vinegar solution to the cervix and vagina with a cotton ball or swab. The vinegar makes abnormal tissue turn white so your doctor can identify areas that may need further evaluation.    If your doctor sees areas of abnormal tissue during the colposcopy, he or she may also perform a biopsy. This involves removing small samples of tissue from any abnormal areas in or around the cervix. A specialist doctor called a pathologist will examine these samples.    It usually only takes 20 to 30 minutes for your doctor to complete a colposcopy and biopsy.    Why is a colposcopy performed?   A colposcopy is usually performed to help your doctor find the reason for an abnormal Pap smear.     Why is colposcopy important?   Colposcopy is important because it can detect cancer of the cervix at an early stage. Be sure to talk with your doctor after the test so that any problems are taken care of right away.     Is the procedure painful?   If your doctor takes a biopsy sample, you may feel mild cramps and pinching when he or she removes the abnormal tissue. Relaxing your muscles as much as possible and taking slow, deep breaths during the procedure may help. You may feel less discomfort if you take an over-the-counter pain reliever before the procedure. Ask your doctor whether you should take medicine, what kind to take, how much to take and when to take it. (When you ask, be sure to let your doctor know if you're pregnant or if you're allergic to aspirin or  ibuprofen.)    How should I prepare to have a colposcopy?   You may be more comfortable if you empty your bladder and bowels before the procedure. Don't douche, use vaginal medications or tampons, or have sexual intercourse during the 24 hours before your appointment.  Does this procedure affect my ability to have children?   No. If your doctor takes a biopsy sample, the amount of tissue taken from your cervix is very small and removing it will not affect any future pregnancies. However, it is important to let your doctor know if you are pregnant now or even if you might be pregnant. This information will change the way your doctor does the procedure.    Will I have bleeding after a colposcopy?   You may have a dark-colored vaginal discharge after the colposcopy. If your doctor takes a biopsy sample, he or she will put a thick, brownish-yellow paste on that area to stop any bleeding. When this paste mixes with blood, it forms a thick black discharge. It's normal to have this discharge for a couple of days after the procedure. It's also normal to have a little spotting for at least two days after a colposcopy.    Can I use tampons after the procedure?   No. Don't use tampons or put anything in your vagina for at least  1 week after the procedure, or until your doctor tells you it's safe. Don't have sexual intercourse for at least 1 week.    When should I call my doctor?   Call your doctor right away if you have any of the following problems after your colposcopy:     Heavy vaginal bleeding (using more than one sanitary pad per hour).   Lower abdominal pain.   Fever, chills or a bad-smelling vaginal odor.   When will I get the results of my colposcopy?   It usually takes 1 to 2 weeks for your doctor to get a report from the pathologist who looks at your biopsy samples. Your doctor's office will contact you when these results are available. You will need to make a follow-up appointment with your doctor to talk about the  results and any additional treatment you may need. Try to schedule an appointment no later than 1 month after your colposcopy.    Reviewed/Updated: 06/05  Created: 9/00     This handout provides a general overview on this topic and may not apply to everyone. To find out if this handout applies to you and to get more information on this subject, talk to your family doctor.     Copyright  2000-2005 [employer]

## 2010-06-13 NOTE — Progress Notes (Signed)
25 year old female presents for colposcopy.  Pap smear   1 months ago showed: ASCUS. The prior pap showed normal.   Patient's last menstrual period was 05/26/2010.  Allergies: Penicillins    Prior cervical/vaginal disease: None.  Prior cervical treatment: no treatment.    Procedure reviewed with patient as well as risks of cervical biopsy and endocervical curettage including pain, bleeding and infection.  Written consent obtained.  Time out taken.    PROCEDURE:  Speculum placed in vagina and excellent visualization of cervix   acheived, cervix swabbed x 3 with acetic acid solution.    FINDINGS:  Cervix: no visible lesions; cervix then swabbed with acetic-acid solution, SCJ visualized - lesion at 9 o'clock, endocervical curettage performed, cervical biopsies taken at 9 o'clock, specimen labelled and sent to pathology, hemostasis achieved with Monsel's solution and/or silver nitrate  .    Vaginal inspection: vaginal colposcopy not performed.    Vulvar colposcopy: vulvar colposcopy not performed.    Procedure Summary: Patient tolerated procedure well and colposcopy adequate.    ASSESSMENT: Normal exam without visible pathology.    PLAN: specimens labelled and sent to Pathology, will base further treatment on Pathology findings and post biopsy instructions given to patient.    Procedure performed by  Maryelizabeth Rowan,  MD    Maryelizabeth Rowan, MD  Signature Derived From Controlled Access Password, June 13, 2010, 4:59 PM

## 2010-06-16 ENCOUNTER — Other Ambulatory Visit (INDEPENDENT_AMBULATORY_CARE_PROVIDER_SITE_OTHER): Payer: Self-pay | Admitting: Family Medicine

## 2010-06-17 NOTE — Procedures (Signed)
 SPECIMENS SUBMITTED:  Cervical (Pap) Smear     CLINICAL INFORMATION:  LMP: 05/26/2010 Other Clinical Findings: Hx ASCUS  FINAL CYTOLOGIC INTERPRETATION:  No Atypical or Malignant Cells  Reactive cellular changes  Acute inflammation     SPECIMEN ADEQUACY:  Satisfactory for evaluation  The Pap smear is a screening test with an inherent low error rate. Although  a normal [negative] result is highly predictive of the absence of cervical  cancer and its precursor lesions, it does not exclude the presence of  significant disease. Results must be evaluated within the context of the  individual patient.  CONFIDENTIAL HEALTH INFORMATION: Health Care information is personal and  sensitive information. If it is being faxed to you it is done so under  appropriate authorization from the patient or under circumstances that do  not require patient authorization. You, the recipient, are obligated to  maintain it in a safe, secure and confidential manner. Re-disclosure  without additional patient consent or as permitted by law is prohibited.  Unauthorized re-disclosure or failure to maintain confidentiality could  subject you to penalties described in federal and state law.  If you have  received this report or facsimile in error, please notify the [facility]  [facility]ology Department immediately and destroy the received document(s).   Material reviewed and Interpreted and  Report Electronically Signed by:  Robin Searing M.D. [phone])  Attending Surgical Pathologist  06/17/10 13:00  Electronic Signature derived from a single  controlled access password

## 2010-06-17 NOTE — Procedures (Signed)
 FINAL PATHOLOGIC DIAGNOSIS:  A: Cervix, 9 o'clock, biopsy       -Benign cervix, see comment.  B: Endocervix, curettage       -Rare fragments of benign endocervix.  COMMENT:  Specimen A:  Concurrent cytology material ([id number]) shows  reactive changes on the Pap smear.  The ASCUS seen on Pap smear  ([id number]) are not accounted for in the current material.     SPECIMEN(S) SUBMITTED:  A:  9 o'clock cervix  B:  Endocervical curettings  CLINICAL HISTORY:  V82.9A, 795.01E.  ASCUS.     GROSS DESCRIPTION:  A:  The specimen (received in formalin, labeled with the patient's name,  medical record number, and "9") consists of a single piece of tan-yellow to  tan-brown mucus-covered soft tissue which measures 0.7 x 0.5 x 0.3 cm.  The  specimen is entirely submitted in cassette A1.  B:  The specimen (received in formalin, labeled with the patient's name,  medical record number, and "ECC") consists of a specimen jar that was  completely filtered and no tissue can be grossly identified.  The entire  filtered contents have been submitted in cassette B1; however, there is no  identifiable tissue in this specimen jar.  SM/AA/pd  CONFIDENTIAL HEALTH INFORMATION: Health Care information is personal and  sensitive information. If it is being faxed to you it is done so under  appropriate authorization from the patient or under circumstances that do  not require patient authorization. You, the recipient, are obligated to  maintain it in a safe, secure and confidential manner. Re-disclosure  without additional patient consent or as permitted by law is prohibited.  Unauthorized re-disclosure or failure to maintain confidentiality could  subject you to penalties described in federal and state law.  If you have  received this report or facsimile in error, please notify the   Pathology Department immediately and destroy the received document(s).   Material reviewed and Interpreted and  Report Electronically Signed by:  Robin Searing  M.D. [phone])  Attending Surgical Pathologist  06/17/10 16:28  Electronic Signature derived from a single  controlled access password

## 2010-06-19 ENCOUNTER — Telehealth (INDEPENDENT_AMBULATORY_CARE_PROVIDER_SITE_OTHER): Payer: Self-pay | Admitting: Family Medicine

## 2010-06-19 NOTE — Telephone Encounter (Signed)
Path reports reviewed with patient by phone.  Plan follow up Pap in 6 months.

## 2010-06-23 ENCOUNTER — Telehealth (INDEPENDENT_AMBULATORY_CARE_PROVIDER_SITE_OTHER): Payer: Self-pay | Admitting: Family Medicine

## 2010-06-23 NOTE — Telephone Encounter (Signed)
Will forward to triage nurse Dr Shane Crutch not in clinic today

## 2010-06-23 NOTE — Telephone Encounter (Signed)
Patient called requesting to speak with Dr Shane Crutch regarding her test pap results / patient is confused with what her results mean / no other info given    See phone encounter 06-19-10

## 2010-06-23 NOTE — Telephone Encounter (Signed)
Returned call. Spoke to Strum, husband. States he called and they requested to speak with rosenblum. Advised I am able to speak with patient and provide information, answer questions. States he believes that she would prefer to speak with rosenblum directly, informed she is not in clinic today.   Asked that he please let the patient know that the nursing staff can speak with her. States he will give her the msg, but requesting call from Dr Shane Crutch, he understands that she is not in clinic today.      Per letter:    The PAP smear is a test used to screen for cervical cancer and conditions which may lead to cervical cancer. The result of your PAP smear done on 05/06/10 are as follows:    ASCUS - (Atypical Squamous Cells of Undetermined Significance).  HPV was present on the Pap smear sample.    It is recommended that you have:    A colposcopy. A colposcopy is performed similar to a PAP smear, but your Doctor will use a microscope to look at the cervix in greater detail.

## 2010-06-23 NOTE — Telephone Encounter (Signed)
Left message on voicemail, returning patient's call.  If patient or husband calls back and I am not in the office, please note:    05/05/10 Pap was abnormal, ASCUS with HPV present    06/13/10 Pap was NORMAL, no evidence of atypical cells.  HPV not tested when Pap smear is normal.  Concurrent cervical biopsies were also normal, showing no evidence of atypia or dysplasia.    Patient should plan for repeat Pap in 6 months.    They are welcome to call back again if further questions, I will be in the office on Tuesday 3/27.    Maryelizabeth Rowan, MD  Signature Derived From Controlled Access Password, June 23, 2010, 10:40 AM

## 2010-06-24 ENCOUNTER — Telehealth (INDEPENDENT_AMBULATORY_CARE_PROVIDER_SITE_OTHER): Payer: Self-pay | Admitting: Family Medicine

## 2010-06-24 NOTE — Telephone Encounter (Signed)
Pt husband Mikle Bosworth returning call,req to speak to Dr Shane Crutch regarding pt results, to please call husband after 10:45 am this morning,

## 2010-06-24 NOTE — Telephone Encounter (Signed)
Will forward message to Dr Shane Crutch

## 2010-06-24 NOTE — Telephone Encounter (Signed)
Husband would like to speak with Dr. Shane Crutch. He tried to have her paged through the operator but received no answer. He states that Dr. Shane Crutch knows what this is about.

## 2010-06-24 NOTE — Telephone Encounter (Signed)
Left message on voicemail, patient or her husband can call hospital page operator to page me so that we can speak.

## 2010-06-24 NOTE — Telephone Encounter (Signed)
Will forward message to Dr Shane Crutch (see message below)

## 2010-06-24 NOTE — Telephone Encounter (Signed)
FYI  Husband calling back Dr. Shane Crutch, Followed Dr.Rosenblum directions, see last telephone encounter 06/24/10.

## 2010-06-25 ENCOUNTER — Encounter (INDEPENDENT_AMBULATORY_CARE_PROVIDER_SITE_OTHER): Payer: Self-pay | Admitting: Family Medicine

## 2010-06-25 NOTE — Telephone Encounter (Signed)
Spoke with patient's husband by phone, all questions regarding Pap/colpo answered.  Patient to follow up in 6 months for repeat Pap.

## 2010-12-16 ENCOUNTER — Encounter (INDEPENDENT_AMBULATORY_CARE_PROVIDER_SITE_OTHER): Admitting: Family Medicine

## 2010-12-23 ENCOUNTER — Encounter (INDEPENDENT_AMBULATORY_CARE_PROVIDER_SITE_OTHER): Payer: Self-pay | Admitting: Family Medicine

## 2010-12-23 ENCOUNTER — Ambulatory Visit (INDEPENDENT_AMBULATORY_CARE_PROVIDER_SITE_OTHER): Admitting: Family Medicine

## 2010-12-23 VITALS — BP 137/95 | HR 87 | Temp 98.1°F | Resp 20 | Ht 60.0 in | Wt 174.5 lb

## 2010-12-25 NOTE — Progress Notes (Signed)
Here for follow up Pap --    05/05/10 Pap:  ASCUS, HR HPV positive  06/13/10 Pap:  [facility]  3/176/12 colpo:  Benign, ECC benign    I reviewed the patient's medical record, including PMH, SH, medications, allergies, and ROS.    Filed Vitals:    12/23/10 1131   BP: 137/95   Pulse: 87   Temp: 98.1 F (36.7 C)   TempSrc: Oral   Resp: 20   Height: 5' (1.524 m)   Weight: 79.153 kg (174 lb 8 oz)     Physical Exam:   General Appearance: healthy, alert, no distress, pleasant affect, cooperative.  Gyn:  Normal external female genitalia.  Speculum no discharge.  Bi-manual no CMT    ASSESSMENT/PLAN:    Ruth Hall was seen today for recheck.    Diagnoses and associated orders for this visit:    History of hpv infection  - OBTAINING SCREEN PAP SMEAR  - Cytopath Gyn

## 2010-12-29 NOTE — Procedures (Signed)
 SPECIMENS SUBMITTED:  Cervical (Pap) Smear;SurePath Vial Received     CLINICAL INFORMATION:   No LMP Given; Other Clinical Findings: Screening  FINAL CYTOLOGIC INTERPRETATION:  Low-grade squamous intraepithelial lesion (LSIL)     COMMENT:  Encompassing: HPV / mild dysplasia / CIN 1     SPECIMEN ADEQUACY:  Satisfactory for evaluation; no transformation zone component identified  The Pap smear is a screening test with an inherent low error rate. Although  a normal [negative] result is highly predictive of the absence of cervical  cancer and its precursor lesions, it does not exclude the presence of  significant disease. Results must be evaluated within the context of the  individual patient.  CONFIDENTIAL HEALTH INFORMATION: Health Care information is personal and  sensitive information. If it is being faxed to you it is done so under  appropriate authorization from the patient or under circumstances that do  not require patient authorization. You, the recipient, are obligated to  maintain it in a safe, secure and confidential manner. Re-disclosure  without additional patient consent or as permitted by law is prohibited.  Unauthorized re-disclosure or failure to maintain confidentiality could  subject you to penalties described in federal and state law.  If you have  received this report or facsimile in error, please notify the [place]  Pathology Department immediately and destroy the received document(s).   Material reviewed and Interpreted and  Report Electronically Signed by:  Kingsley Spittle M.D. [phone])  Attending Surgical Pathologist  12/29/10 16:02  Electronic Signature derived from a single  controlled access password

## 2010-12-30 ENCOUNTER — Encounter (INDEPENDENT_AMBULATORY_CARE_PROVIDER_SITE_OTHER): Payer: Self-pay | Admitting: Family Medicine

## 2011-01-07 ENCOUNTER — Telehealth (INDEPENDENT_AMBULATORY_CARE_PROVIDER_SITE_OTHER): Payer: Self-pay | Admitting: Family Medicine

## 2011-01-07 NOTE — Telephone Encounter (Signed)
Pt requesting a call back from the nurse regarding the letter she received. Please contact [phone]    Pt scheduled for colpo on Friday 01/09/11 @3pm  with Dr. Shane Crutch

## 2011-01-07 NOTE — Telephone Encounter (Signed)
Returned patient's call patient tearful and asking a lot of question's regarding the pap  result letter I let patient know that I was not a nurse and was unable to advise her patient asked that Dr Shane Crutch please call her back at [phone]

## 2011-01-08 ENCOUNTER — Telehealth (INDEPENDENT_AMBULATORY_CARE_PROVIDER_SITE_OTHER): Payer: Self-pay | Admitting: Family Medicine

## 2011-01-08 NOTE — Telephone Encounter (Signed)
Left message on voicemail, happy to talk with patient and answer any questions/concerns.

## 2011-01-09 ENCOUNTER — Ambulatory Visit (INDEPENDENT_AMBULATORY_CARE_PROVIDER_SITE_OTHER): Admitting: Family Medicine

## 2011-01-09 ENCOUNTER — Encounter (INDEPENDENT_AMBULATORY_CARE_PROVIDER_SITE_OTHER): Payer: Self-pay | Admitting: Family Medicine

## 2011-01-09 VITALS — BP 152/102 | HR 126 | Temp 99.3°F | Resp 20 | Ht 60.0 in | Wt 171.5 lb

## 2011-01-09 NOTE — Patient Instructions (Signed)
Colposcopy    What is a colposcopy?   A colposcopy is a way your doctor can examine your genitals, vagina and cervix closely. A colposcope is an instrument that shines a light on the cervix and magnifies the view for your doctor. At the beginning of the exam, you lie back and place your feet in the stirrups as you would for a Pap smear. Your doctor inserts a speculum into your vagina and opens it slightly so he or she can see your cervix. Then your doctor applies a vinegar solution to the cervix and vagina with a cotton ball or swab. The vinegar makes abnormal tissue turn Corkum so your doctor can identify areas that may need further evaluation.    If your doctor sees areas of abnormal tissue during the colposcopy, he or she may also perform a biopsy. This involves removing small samples of tissue from any abnormal areas in or around the cervix. A specialist doctor called a pathologist will examine these samples.    It usually only takes 20 to 30 minutes for your doctor to complete a colposcopy and biopsy.    Why is a colposcopy performed?   A colposcopy is usually performed to help your doctor find the reason for an abnormal Pap smear.     Why is colposcopy important?   Colposcopy is important because it can detect cancer of the cervix at an early stage. Be sure to talk with your doctor after the test so that any problems are taken care of right away.     Is the procedure painful?   If your doctor takes a biopsy sample, you may feel mild cramps and pinching when he or she removes the abnormal tissue. Relaxing your muscles as much as possible and taking slow, deep breaths during the procedure may help. You may feel less discomfort if you take an over-the-counter pain reliever before the procedure. Ask your doctor whether you should take medicine, what kind to take, how much to take and when to take it. (When you ask, be sure to let your doctor know if you're pregnant or if you're allergic to aspirin or  ibuprofen.)    How should I prepare to have a colposcopy?   You may be more comfortable if you empty your bladder and bowels before the procedure. Don't douche, use vaginal medications or tampons, or have sexual intercourse during the 24 hours before your appointment.  Does this procedure affect my ability to have children?   No. If your doctor takes a biopsy sample, the amount of tissue taken from your cervix is very small and removing it will not affect any future pregnancies. However, it is important to let your doctor know if you are pregnant now or even if you might be pregnant. This information will change the way your doctor does the procedure.    Will I have bleeding after a colposcopy?   You may have a dark-colored vaginal discharge after the colposcopy. If your doctor takes a biopsy sample, he or she will put a thick, brownish-yellow paste on that area to stop any bleeding. When this paste mixes with blood, it forms a thick black discharge. It's normal to have this discharge for a couple of days after the procedure. It's also normal to have a little spotting for at least two days after a colposcopy.    Can I use tampons after the procedure?   No. Don't use tampons or put anything in your vagina for at least   1 week after the procedure, or until your doctor tells you it's safe. Don't have sexual intercourse for at least 1 week.    When should I call my doctor?   Call your doctor right away if you have any of the following problems after your colposcopy:     Heavy vaginal bleeding (using more than one sanitary pad per hour).   Lower abdominal pain.   Fever, chills or a bad-smelling vaginal odor.   When will I get the results of my colposcopy?   It usually takes 1 to 2 weeks for your doctor to get a report from the pathologist who looks at your biopsy samples. Your doctor's office will contact you when these results are available. You will need to make a follow-up appointment with your doctor to talk about the  results and any additional treatment you may need. Try to schedule an appointment no later than 1 month after your colposcopy.    Reviewed/Updated: 06/05  Created: 9/00     This handout provides a general overview on this topic and may not apply to everyone. To find out if this handout applies to you and to get more information on this subject, talk to your family doctor.     Copyright  2000-2005 [employer]

## 2011-01-09 NOTE — Progress Notes (Signed)
25 year old female presents for colposcopy. Pap smear   1 months ago showed: LSIL. The prior pap showed ASCUS.   No LMP recorded.  Allergies: Penicillins    Prior cervical/vaginal disease: None.  Prior cervical treatment: no treatment.    Procedure reviewed with patient as well as risks of cervical biopsy and endocervical curettage including pain, bleeding and infection.  Written consent obtained.  Time out taken.    PROCEDURE:  Speculum placed in vagina and excellent visualization of cervix   acheived, cervix swabbed x 3 with acetic acid solution.    FINDINGS:  Cervix: no visible lesions; cervix then swabbed with acetic-acid solution, SCJ visualized - lesion at 12 and 6 o'clock, endocervical curettage performed, cervical biopsies taken at 12 and 6 o'clock, specimen labelled and sent to pathology, hemostasis achieved with Monsel's solution and/or silver nitrate  .    Vaginal inspection: vaginal colposcopy not performed.    Vulvar colposcopy: vulvar colposcopy not performed.    Procedure Summary: Patient tolerated procedure well and colposcopy adequate.    ASSESSMENT: HPV related changes.    PLAN: specimens labelled and sent to Pathology, will base further treatment on Pathology findings and post biopsy instructions given to patient.      Maryelizabeth Rowan, MD  Signature Derived From Controlled Access Password, January 09, 2011, 3:51 PM

## 2011-01-12 ENCOUNTER — Encounter (INDEPENDENT_AMBULATORY_CARE_PROVIDER_SITE_OTHER): Payer: Self-pay | Admitting: Family Medicine

## 2011-01-14 ENCOUNTER — Telehealth (INDEPENDENT_AMBULATORY_CARE_PROVIDER_SITE_OTHER): Payer: Self-pay | Admitting: Family Medicine

## 2011-01-14 ENCOUNTER — Encounter (INDEPENDENT_AMBULATORY_CARE_PROVIDER_SITE_OTHER): Payer: Self-pay | Admitting: Family Medicine

## 2011-01-14 NOTE — Procedures (Signed)
 FINAL PATHOLOGIC DIAGNOSIS:  A: Cervix, 4 o'clock, biopsy       -Benign endocervical tissue with microglandular adenosis, see comment.  B: Cervix, 12 o'clock, biopsy       -Benign endocervical tissue with focal chronic-inactive inflammation,  see          comment.  C: Endocervix, curettage       -Benign scant endocervical tissue, see comment.  COMMENT:  No ectocervical mucosa was present for evaluation. The concurrent  pap shows LSIL which is not seen here.     SPECIMEN(S) SUBMITTED:  A:  Cervical biopsy at 4 o'clock  B:  Cervical biopsy at 12 o'clock  C:  Endocervical curettage  CLINICAL HISTORY:  ICD9 code: 622.11.  LSIL on Pap.     GROSS DESCRIPTION:  A:  The specimen (received in formalin, labeled with the patient's name,  medical record number and "4 o'clock") consists of two red-tan soft tissue  fragments ranging in size from 0.2 cm up to 0.6 cm in greatest dimension.  The specimen is entirely submitted in cassette A1.  B:  The specimen (received in formalin, labeled with the patient's name,  medical record number and "12 o'clock") consists of a single tan soft  tissue fragment measuring 0.6 x 0.5 x 0.3 cm.  The specimen is entirely  submitted in cassette B1.  C:  The specimen (received in formalin, labeled with the patient's name,  medical record number and "ECC") consists of red-tan soft tissue fragments  and mucus with an aggregate measurement of 2.0 x 0.7 x 0.1 cm.  The  specimen is filtered and entirely submitted in cassette C1.  MW/NE/nj  CONFIDENTIAL HEALTH INFORMATION: Health Care information is personal and  sensitive information. If it is being faxed to you it is done so under  appropriate authorization from the patient or under circumstances that do  not require patient authorization. You, the recipient, are obligated to  maintain it in a safe, secure and confidential manner. Re-disclosure  without additional patient consent or as permitted by law is prohibited.  Unauthorized re-disclosure or  failure to maintain confidentiality could  subject you to penalties described in federal and state law.  If you have  received this report or facsimile in error, please notify the [place]  Pathology Department immediately and destroy the received document(s).   Material reviewed and Interpreted and  Report Electronically Signed by:  Eldridge Dace M.D. [phone])  Attending Surgical Pathologist  01/14/11 12:04  Electronic Signature derived from a single  controlled access password

## 2011-01-14 NOTE — Procedures (Signed)
 SPECIMENS SUBMITTED:  Cervical (Pap) Smear;SurePath Vial Received     CLINICAL INFORMATION:   No LMP Given; Other Clinical Findings: History of Abnormal Pap Smear,  Dysplasia, Cervical Cancer Within 5 Years.  FINAL CYTOLOGIC INTERPRETATION:  Low-grade squamous intraepithelial lesion (LSIL)     COMMENT:  Encompassing: HPV / mild dysplasia / CIN 1  Previous and/or concurrent surgical material reviewed     SPECIMEN ADEQUACY:  Satisfactory for evaluation  The Pap smear is a screening test with an inherent low error rate. Although  a normal [negative] result is highly predictive of the absence of cervical  cancer and its precursor lesions, it does not exclude the presence of  significant disease. Results must be evaluated within the context of the  individual patient.  CONFIDENTIAL HEALTH INFORMATION: Health Care information is personal and  sensitive information. If it is being faxed to you it is done so under  appropriate authorization from the patient or under circumstances that do  not require patient authorization. You, the recipient, are obligated to  maintain it in a safe, secure and confidential manner. Re-disclosure  without additional patient consent or as permitted by law is prohibited.  Unauthorized re-disclosure or failure to maintain confidentiality could  subject you to penalties described in federal and state law.  If you have  received this report or facsimile in error, please notify the [facility]  [facility]ology Department immediately and destroy the received document(s).   Material reviewed and Interpreted and  Report Electronically Signed by:  Eldridge Dace M.D. [phone])  Attending Surgical Pathologist  01/14/11 12:04  Electronic Signature derived from a single  controlled access password

## 2011-01-14 NOTE — Telephone Encounter (Signed)
Left message on voicemail regarding colpo results and Pap smear.  Plan follow up Pap in 6 months.

## 2011-01-15 ENCOUNTER — Telehealth (INDEPENDENT_AMBULATORY_CARE_PROVIDER_SITE_OTHER): Payer: Self-pay | Admitting: Family Medicine

## 2011-01-15 NOTE — Telephone Encounter (Signed)
Will forward message to Dr Luvenia Heller for referral

## 2011-01-15 NOTE — Telephone Encounter (Signed)
Per plastic surgery physicians the patient needs a referral to see Derm first and then if Derm thinks the pt needs to see plastic surgery they will refer her to that dept

## 2011-01-16 ENCOUNTER — Telehealth (INDEPENDENT_AMBULATORY_CARE_PROVIDER_SITE_OTHER): Payer: Self-pay | Admitting: Family Medicine

## 2011-01-16 NOTE — Telephone Encounter (Signed)
Patient returning Dr Karma Ganja phone call regarding pap smear results. Informed patient that letter with results was sent out. Patient would like to speak with Dr Shane Crutch. Please call patient at work# [phone]

## 2011-01-16 NOTE — Telephone Encounter (Signed)
Will forward message to Dr Rosenblum

## 2011-01-16 NOTE — Telephone Encounter (Signed)
Called home phone number, spoke with patient's mother, who recommended I call patient's cell phone.  Called cell phone, left voicemail, I am happy to answer any specific questions she may have.  Letter also sent.

## 2011-01-16 NOTE — Telephone Encounter (Signed)
Patient returning Dr Karma Ganja call regarding her pap smear results. Please call patient regarding results.

## 2011-01-19 NOTE — Telephone Encounter (Signed)
Spoke with patient by phone, all questions answered.  Plan follow up Pap in 6 months.

## 2011-07-02 ENCOUNTER — Ambulatory Visit (INDEPENDENT_AMBULATORY_CARE_PROVIDER_SITE_OTHER): Payer: Self-pay | Admitting: Family Medicine

## 2011-07-06 ENCOUNTER — Ambulatory Visit (INDEPENDENT_AMBULATORY_CARE_PROVIDER_SITE_OTHER): Payer: Commercial Managed Care - PPO | Admitting: Family Medicine

## 2011-07-06 ENCOUNTER — Other Ambulatory Visit: Payer: Self-pay

## 2011-07-06 ENCOUNTER — Encounter (INDEPENDENT_AMBULATORY_CARE_PROVIDER_SITE_OTHER): Payer: Self-pay | Admitting: Family Medicine

## 2011-07-06 VITALS — BP 128/85 | HR 94 | Temp 97.7°F | Resp 16 | Wt 174.0 lb

## 2011-07-06 NOTE — Progress Notes (Signed)
26yo female, here for follow up Pap smear.    I reviewed the patient's medical record, including PMH, SH, medications, allergies, and ROS.    Past Medical History   Diagnosis Date   . HPV in female      Past Surgical History   Procedure Laterality Date   . Cholecystectomy, lap  2006   . C-section  2005     Current Outpatient Prescriptions on File Prior to Visit   Medication Sig Dispense Refill   . CIPROFLOXACIN HCL 0.3 % OP SOLN 2 drops 4x/day, continue for 2 days after symptoms improve  5 ml  0   . norelgestromin-ethinyl estradiol (ORTHO EVRA) 150-20 MCG/24HR patch Apply 1 patch topically every 7 days.  3 patch  11   . phentermine (ADIPEX-P) 37.5 MG tablet Take 37.5 mg by mouth every morning (before breakfast).       . TOBRAMYCIN SULFATE 0.3 % OP SOLN 1-2 gtt OU q4 hrs while awake  one bottle  0     No current facility-administered medications on file prior to visit.     Review of Systems - Constitutional: negative.  GYN: negative.    History   Substance Use Topics   . Smoking status: Never Smoker    . Smokeless tobacco: Never Used   . Alcohol Use: No     Temperature:  [97.7 F (36.5 C)] 97.7 F (36.5 C) (04/08 1002)  Blood pressure (BP): (128)/(85) 128/85 mmHg (04/08 1002)  Heart Rate:  [94] 94  (04/08 1002)  Respirations:  [16] 16  (04/08 1002)  Pain Score:  [-] 0 (04/08 1002)  O2 Device:  [-]     Physical Exam:   General Appearance: healthy, alert, no distress, pleasant affect, cooperative.  Gyn:  Normal external female genitalia.  Speculum no discharge, no lesions noted .  Bi-manual no CMT, uterus not enlarged    ASSESSMENT/PLAN:    Ruth Hall was seen today for gyn exam.    Diagnoses and associated orders for this visit:    General medical exam    Routine gynecological examination  - OBTAINING SCREEN PAP SMEAR  - Cytopath Gyn  [facility] ACCESS CODE PROCEDURE

## 2011-07-10 NOTE — Procedures (Signed)
SPECIMENS SUBMITTED:  Cervical (Pap) Smear;SurePath Vial Received     CLINICAL INFORMATION:  LMP: 06/05/2011 Other Clinical Findings: History of HPV Infection within 5  years  FINAL CYTOLOGIC INTERPRETATION:  Squamous intraepithelial lesion (SIL), grade cannot be determined.  See Comment     COMMENT:  HPV Subtyping will be performed on excess Sure-Path collection fluid from  the specimen vial. Result will be reported in EPIC separately.  Previous cytology reviewed  Many LSIL cells and some smaller cells are seen worrisome for higher grade  lesion. Correlation with biopsy is recommended.     SPECIMEN ADEQUACY:  Satisfactory for evaluation  The Pap smear is a screening test with an inherent low error rate. Although  a normal [negative] result is highly predictive of the absence of cervical  cancer and its precursor lesions, it does not exclude the presence of  significant disease. Results must be evaluated within the context of the  individual patient.  CONFIDENTIAL HEALTH INFORMATION: Health Care information is personal and  sensitive information. If it is being faxed to you it is done so under  appropriate authorization from the patient or under circumstances that do  not require patient authorization. You, the recipient, are obligated to  maintain it in a safe, secure and confidential manner. Re-disclosure  without additional patient consent or as permitted by law is prohibited.  Unauthorized re-disclosure or failure to maintain confidentiality could  subject you to penalties described in federal and state law.  If you have  received this report or facsimile in error, please notify the [place]  Pathology Department immediately and destroy the received document(s).   Material reviewed and Interpreted and  Report Electronically Signed by:  Eldridge Dace M.D. [phone])  Attending Surgical Pathologist  07/10/11 09:20  Electronic Signature derived from a single  controlled access password

## 2011-07-20 ENCOUNTER — Encounter (INDEPENDENT_AMBULATORY_CARE_PROVIDER_SITE_OTHER): Payer: Self-pay | Admitting: Family Medicine

## 2011-07-24 ENCOUNTER — Telehealth (INDEPENDENT_AMBULATORY_CARE_PROVIDER_SITE_OTHER): Payer: Self-pay | Admitting: Family Medicine

## 2011-07-24 NOTE — Telephone Encounter (Signed)
Left message on voicemail, I would like to discuss recent test results.  MyChart message also sent.

## 2011-07-27 ENCOUNTER — Encounter (INDEPENDENT_AMBULATORY_CARE_PROVIDER_SITE_OTHER): Payer: Self-pay

## 2011-07-27 ENCOUNTER — Encounter (INDEPENDENT_AMBULATORY_CARE_PROVIDER_SITE_OTHER): Payer: Self-pay | Admitting: Family Medicine

## 2011-07-27 NOTE — Telephone Encounter (Signed)
Routing to TRIAGE to discuss Pt's concerns, etc.

## 2011-07-27 NOTE — Telephone Encounter (Signed)
Rn called to pt and reassured pt that for a more definite answers a repeat colpo would be needed pt already had a scheduled appt on 08/07/11 for a colpo.

## 2011-07-27 NOTE — Telephone Encounter (Signed)
From: Ruth Hall  To: Maryelizabeth Rowan, MD  Sent: 07/24/2011 7:41 PM PDT  Subject: [id number] up    Hi. Dr. Shane Crutch     This is very devastating to hear and I was wondering if I have to make an appointment as soon as possible or Wait a year to go in and be checked. I don't want to give this diseases a chance to become Cin 1 or cancer. So please let me know what you suggest .      Thank you,   Ruth Hall    ----- Message -----  From: Maryelizabeth Rowan, MD  Sent: 07/24/2011 3:49 PM PDT  To: Ruth Hall  Subject: follow up    Hi --    I am sorry I was unable to reach you by phone today. I received the results of your recent Pap smear. The results were:    HPV: present.  Pap: "Squamous intraepithelial lesion (SIL), grade cannot be determined."     I recommend we repeat a colposcopy exam. Please call and schedule this at your earliest convenience.  Please let me know if you have questions.    Warm regards,    Maryelizabeth Rowan, MD  Signature Derived From Controlled Access Password, July 24, 2011, 3:49 PM

## 2011-07-28 ENCOUNTER — Encounter (INDEPENDENT_AMBULATORY_CARE_PROVIDER_SITE_OTHER): Payer: Self-pay | Admitting: Family Medicine

## 2011-07-28 ENCOUNTER — Telehealth (INDEPENDENT_AMBULATORY_CARE_PROVIDER_SITE_OTHER): Payer: Self-pay | Admitting: Family Medicine

## 2011-07-28 NOTE — Telephone Encounter (Signed)
PT contact info  Routing to Dr. Shane Crutch

## 2011-07-28 NOTE — Telephone Encounter (Signed)
From: Ruth Hall  To: Maryelizabeth Rowan, MD  Sent: 07/28/2011 4:10 PM PDT  Subject: follow up    Okay that sounds perfect, I think it will calm me down a little as I'm very stressed and nerves about getting a 3rd colp done and this HPV thing that keeps on coming and going. The best number to reach me at is [phone]. From 7:30am - 11:30 am or 6:30pm and on. Thank you so much for your time and patience with me.    Ruth Hall    Sent from my iPhone    ----- Message -----  From: Maryelizabeth Rowan, MD  Sent: 07/28/11 at 6:14 AM  To: Ruth Hall  Subject: follow up    Let's try and talk by phone today. Please let me know the best times to reach you, and the best phone number to call. We can discuss your test results, and the best management plan going forward.    Warmly,    Maryelizabeth Rowan, MD  Signature Derived From Controlled Access Password, July 28, 2011, 6:14 AM

## 2011-07-28 NOTE — Telephone Encounter (Signed)
Attempted to reach patient by phone, unable to leave message.  Will try again in AM.

## 2011-07-31 ENCOUNTER — Encounter (INDEPENDENT_AMBULATORY_CARE_PROVIDER_SITE_OTHER): Payer: Self-pay | Admitting: Family Medicine

## 2011-07-31 MED ORDER — LORAZEPAM 1 MG OR TABS
1.0000 mg | ORAL_TABLET | ORAL | Status: DC
Start: 2011-07-31 — End: 2011-10-07

## 2011-07-31 MED ORDER — CITALOPRAM HYDROBROMIDE 20 MG OR TABS
20.0000 mg | ORAL_TABLET | ORAL | Status: DC
Start: 2011-07-31 — End: 2011-10-07

## 2011-07-31 NOTE — Telephone Encounter (Signed)
PT PHONE INFO.  [phone] until 12:35 5/3  Routing to Dr. Shane Crutch

## 2011-07-31 NOTE — Telephone Encounter (Signed)
From: Ruth Hall  To: Maryelizabeth Rowan, MD  Sent: 07/30/2011 8:26 AM PDT  Subject: follow up    Sorry about that. [phone] ill be at this number till 12:35. Thanks   Sent from my iPhone    ----- Message -----  From: Maryelizabeth Rowan, MD  Sent: 07/29/11 at 2:25 PM  To: Ruth Hall  Subject: RE: follow up    Hi --    I've tried to call your number several times, each time I get a message telling me to enter a number and I am unable to leave a message. Is there another number I can call to reach you?    Warmly,    Maryelizabeth Rowan, MD  Signature Derived From Controlled Access Password, Jul 29, 2011, 2:25 PM      ----- Message -----   From: Ruth Hall   Sent: 07/28/2011 4:10 PM PDT   To: Maryelizabeth Rowan, MD  Subject: follow up    Okay that sounds perfect, I think it will calm me down a little as I'm very stressed and nerves about getting a 3rd colp done and this HPV thing that keeps on coming and going. The best number to reach me at is [phone]. From 7:30am - 11:30 am or 6:30pm and on. Thank you so much for your time and patience with me.    Ruth Hall    Sent from my iPhone    ----- Message -----  From: Maryelizabeth Rowan, MD  Sent: 07/28/11 at 6:14 AM  To: Ruth Hall  Subject: follow up    Let's try and talk by phone today. Please let me know the best times to reach you, and the best phone number to call. We can discuss your test results, and the best management plan going forward.    Warmly,    Maryelizabeth Rowan, MD  Signature Derived From Controlled Access Password, July 28, 2011, 6:14 AM

## 2011-07-31 NOTE — Telephone Encounter (Signed)
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  Has colpo scheduled 5/10.  Plan reviewed.

## 2011-08-07 ENCOUNTER — Other Ambulatory Visit (INDEPENDENT_AMBULATORY_CARE_PROVIDER_SITE_OTHER): Payer: Commercial Managed Care - PPO | Admitting: Family Medicine

## 2011-08-12 ENCOUNTER — Telehealth (INDEPENDENT_AMBULATORY_CARE_PROVIDER_SITE_OTHER): Payer: Self-pay | Admitting: Marriage & Family Therapist

## 2011-08-12 NOTE — Telephone Encounter (Signed)
Attempted to call pt re: 07/31/2011 referral to CC by Dr. Shane Crutch. Called primary number of [phone], the call seemed to go through (rang repeatedly), but was not picked up; pt does not appear to have voice mail on this line. Also tried alternative number of [phone]; I reached a busy signal, there was not an option to leave a VM. I will try to reach pt again at these numbers on another day, before closing this referral.

## 2011-08-14 NOTE — Telephone Encounter (Signed)
Today I again tried to reach pt at both numbers listed below, with same results. We do not seem to be able to reach this pt, I am now closing the CC referral.

## 2011-09-10 ENCOUNTER — Telehealth (INDEPENDENT_AMBULATORY_CARE_PROVIDER_SITE_OTHER): Payer: Self-pay | Admitting: Family Medicine

## 2011-09-10 NOTE — Telephone Encounter (Signed)
RN spoke with patient's mother, Era Bumpers. Era Bumpers stated that she will call the patient to see if she can come in at 1 PM instead of 220 PM, and will call back this nurse to notify her. Will wait for return call.

## 2011-09-10 NOTE — Telephone Encounter (Signed)
Message copied by Emiliano Dyer NAVA on Thu Sep 10, 2011  8:56 AM  ------       Message from: Maryelizabeth Rowan       Created: Thu Sep 10, 2011  4:31 AM       Regarding: Fri pm schedule         Hi --              I currently only have one patient scheduled for Friday afternoon, at 2:20p.  Could you please call patient and see if she can come in earlier?  I am leaving directly after seeing this patient (to get my son off to camp), so it would be great if she could come in at 1p.  Thanks --              Tamela Oddi  ------

## 2011-09-10 NOTE — Telephone Encounter (Signed)
Mother returned call appointment rescheduled to the 1:00 pm slot

## 2011-09-11 ENCOUNTER — Other Ambulatory Visit (INDEPENDENT_AMBULATORY_CARE_PROVIDER_SITE_OTHER): Payer: Commercial Managed Care - PPO | Admitting: Family Medicine

## 2011-09-11 ENCOUNTER — Ambulatory Visit (INDEPENDENT_AMBULATORY_CARE_PROVIDER_SITE_OTHER): Payer: Commercial Managed Care - PPO | Admitting: Family Medicine

## 2011-09-11 VITALS — BP 124/83 | HR 106 | Temp 97.4°F | Resp 16 | Ht 61.0 in | Wt 176.5 lb

## 2011-09-11 NOTE — Progress Notes (Signed)
26 year old female presents for colposcopy.    History:  05/05/10 Pap:  ASCUS, HR HPV positive  06/13/10 Pap:  [facility]  3/176/12 colpo:  Benign, ECC benign  12/23/10 Pap:  LSIL  01/09/11:  Pap (repeat) LSIL  01/09/11 colpo:  Benign endocervix with chronic inflammation  07/06/11 Pap:  'SIL, grade cannot be determined.'  HPV positive.  Comment: Previous cytology reviewed. Many LSIL cells and some smaller cells are seen worrisome for higher grade lesion. Correlation with biopsy is recommended.    Allergies: Penicillins    Prior cervical/vaginal disease: as above.  Prior cervical treatment: no treatment.    Procedure reviewed with patient as well as risks of cervical biopsy and endocervical curettage including pain, bleeding and infection.  Written consent obtained.  Time out taken.    PROCEDURE:  Speculum placed in vagina and excellent visualization of cervix   acheived, cervix swabbed x 3 with acetic acid solution.    FINDINGS:  Cervix:Cervix: no visible lesions; cervix then swabbed with acetic-acid solution, SCJ visualized - some WE noted on both anterior and posterior lips of cervix, endocervical curettage performed, cervical biopsies taken at 11 and 4 o'clock, specimen labelled and sent to pathology, hemostasis achieved with Monsel's solution and/or silver nitrate    Vaginal inspection: vaginal colposcopy not performed.    Vulvar colposcopy: vulvar colposcopy not performed.    Procedure Summary: Patient tolerated procedure well and colposcopy adequate.    ASSESSMENT: HPV related changes.    PLAN: specimens labelled and sent to Pathology, will base further treatment on Pathology findings and post biopsy instructions given to patient.    Procedure performed by  Maryelizabeth Rowan,  MD    Maryelizabeth Rowan, MD  Signature Derived From Controlled Access Password, September 11, 2011, 1:12 PM

## 2011-09-11 NOTE — Telephone Encounter (Signed)
Noted. Routed to Dr. Shane Crutch.

## 2011-09-11 NOTE — Patient Instructions (Signed)
Colposcopy    What is a colposcopy?   A colposcopy is a way your doctor can examine your genitals, vagina and cervix closely. A colposcope is an instrument that shines a light on the cervix and magnifies the view for your doctor. At the beginning of the exam, you lie back and place your feet in the stirrups as you would for a Pap smear. Your doctor inserts a speculum into your vagina and opens it slightly so he or she can see your cervix. Then your doctor applies a vinegar solution to the cervix and vagina with a cotton ball or swab. The vinegar makes abnormal tissue turn Corkum so your doctor can identify areas that may need further evaluation.    If your doctor sees areas of abnormal tissue during the colposcopy, he or she may also perform a biopsy. This involves removing small samples of tissue from any abnormal areas in or around the cervix. A specialist doctor called a pathologist will examine these samples.    It usually only takes 20 to 30 minutes for your doctor to complete a colposcopy and biopsy.    Why is a colposcopy performed?   A colposcopy is usually performed to help your doctor find the reason for an abnormal Pap smear.     Why is colposcopy important?   Colposcopy is important because it can detect cancer of the cervix at an early stage. Be sure to talk with your doctor after the test so that any problems are taken care of right away.     Is the procedure painful?   If your doctor takes a biopsy sample, you may feel mild cramps and pinching when he or she removes the abnormal tissue. Relaxing your muscles as much as possible and taking slow, deep breaths during the procedure may help. You may feel less discomfort if you take an over-the-counter pain reliever before the procedure. Ask your doctor whether you should take medicine, what kind to take, how much to take and when to take it. (When you ask, be sure to let your doctor know if you're pregnant or if you're allergic to aspirin or  ibuprofen.)    How should I prepare to have a colposcopy?   You may be more comfortable if you empty your bladder and bowels before the procedure. Don't douche, use vaginal medications or tampons, or have sexual intercourse during the 24 hours before your appointment.  Does this procedure affect my ability to have children?   No. If your doctor takes a biopsy sample, the amount of tissue taken from your cervix is very small and removing it will not affect any future pregnancies. However, it is important to let your doctor know if you are pregnant now or even if you might be pregnant. This information will change the way your doctor does the procedure.    Will I have bleeding after a colposcopy?   You may have a dark-colored vaginal discharge after the colposcopy. If your doctor takes a biopsy sample, he or she will put a thick, brownish-yellow paste on that area to stop any bleeding. When this paste mixes with blood, it forms a thick black discharge. It's normal to have this discharge for a couple of days after the procedure. It's also normal to have a little spotting for at least two days after a colposcopy.    Can I use tampons after the procedure?   No. Don't use tampons or put anything in your vagina for at least   1 week after the procedure, or until your doctor tells you it's safe. Don't have sexual intercourse for at least 1 week.    When should I call my doctor?   Call your doctor right away if you have any of the following problems after your colposcopy:     Heavy vaginal bleeding (using more than one sanitary pad per hour).   Lower abdominal pain.   Fever, chills or a bad-smelling vaginal odor.   When will I get the results of my colposcopy?   It usually takes 1 to 2 weeks for your doctor to get a report from the pathologist who looks at your biopsy samples. Your doctor's office will contact you when these results are available. You will need to make a follow-up appointment with your doctor to talk about the  results and any additional treatment you may need. Try to schedule an appointment no later than 1 month after your colposcopy.    Reviewed/Updated: 06/05  Created: 9/00     This handout provides a general overview on this topic and may not apply to everyone. To find out if this handout applies to you and to get more information on this subject, talk to your family doctor.     Copyright  2000-2005 [employer]

## 2011-09-11 NOTE — Interdisciplinary (Signed)
Pre-visit chart review and huddle completed with staff and physician.    Outstanding labs, imaging and consults reviewed and identified.    Health maintanence issues identified and addressed:    Health Maintenance   Topic Date Due   . Influenza Vaccine  12/29/2011   . Cervical Cancer Screening  07/06/2014   . Imm_td/tdap=>26 Yo  05/05/2020

## 2011-09-15 ENCOUNTER — Telehealth (INDEPENDENT_AMBULATORY_CARE_PROVIDER_SITE_OTHER): Payer: Self-pay | Admitting: Marriage & Family Therapist

## 2011-09-15 NOTE — Telephone Encounter (Signed)
Called and spoke with pt today, following up on 09/11/2011 referral to CC by Dr. Shane Crutch. Pt confirmed interest in therapy, scheduled intake for Fri 6/21 at 10:00 a.m.

## 2011-09-16 NOTE — Procedures (Signed)
 FINAL PATHOLOGIC DIAGNOSIS:  A:  Cervix at 4 o'clock, biopsy       -Benign endocervix with microglandular hyperplasia.       -No transformation zone identified.  B:  Cervix at 11 o'clock, biopsy       -Low-grade squamous intraepithelial lesion (CIN 1), see comment.  C:  Endocervix, curettage       -Mostly mucus with scant benign endocervix.  Comment: No HSIL is seen in the biopsy, multiple deeper levels of part B  examined.    SPECIMEN(S) SUBMITTED:  A: cervical biopsy at 4 o'clock  B: cervical biopsy at 11 o'clock  C: endocervical curettage  CLINICAL HISTORY:  ICD-9 code:  796.9 and 300.0. LSIL.    GROSS DESCRIPTION:  A: The specimen (received in formalin, labeled with the patient's name,  medical record number and "4 o'clock") consists of an irregular fragment of  tan soft tissue measuring 0.4 x 0.2 x 0.2 cm. The specimen is entirely  submitted in cassette A1.  B: The specimen (received in formalin, labeled with the patient's name,  medical record number and "11 o'clock") consists of an irregular tan  fragment of soft tissue measuring 0.3 x 0.3 x 0.2 cm. The specimen is  entirely submitted in cassette B1.  C: The specimen (received in formalin, labeled with the patient's name,  medical record number and "ECC") consists of mucus aggregating to 2.2 x 0.3  x 0.2 cm. The specimen is entirely submitted in cassette C1.  AY/NE/jb  CONFIDENTIAL HEALTH INFORMATION: Health Care information is personal and  sensitive information. If it is being faxed to you it is done so under  appropriate authorization from the patient or under circumstances that do  not require patient authorization. You, the recipient, are obligated to  maintain it in a safe, secure and confidential manner. Re-disclosure  without additional patient consent or as permitted by law is prohibited.  Unauthorized re-disclosure or failure to maintain confidentiality could  subject you to penalties described in federal and state law.  If you have  received this  report or facsimile in error, please notify the [facility] Department immediately and destroy the received document(s).    Material reviewed and Interpreted and  Report Electronically Signed by:  Eldridge Dace M.D. [phone])  Attending Surgical Pathologist  09/16/11 12:49  Electronic Signature derived from a single  controlled access password

## 2011-09-17 ENCOUNTER — Telehealth (INDEPENDENT_AMBULATORY_CARE_PROVIDER_SITE_OTHER): Payer: Self-pay | Admitting: Family Medicine

## 2011-09-17 ENCOUNTER — Encounter (INDEPENDENT_AMBULATORY_CARE_PROVIDER_SITE_OTHER): Payer: Self-pay | Admitting: Family Medicine

## 2011-09-17 NOTE — Telephone Encounter (Signed)
Spoke with patient by phone regarding low grade dysplasia noted on colpo biopsy (CIN-1).  Plan co-testing (Pap, HPV) in 12 months.

## 2011-09-18 ENCOUNTER — Ambulatory Visit (INDEPENDENT_AMBULATORY_CARE_PROVIDER_SITE_OTHER): Payer: Commercial Managed Care - PPO | Admitting: Marriage & Family Therapist

## 2011-09-23 NOTE — Progress Notes (Signed)
Saw patient today for 50 Minutes for Individual therapy.              This was session number: 1  (for treatment of: Anxiety)    Treatment model used was: Family Systems    Initial clinical interview, discussed CC consent, limits of confidentiality, collaboration and EMR, fees, cancellation policy, and therapist's status as a Industrial/product designer. Discussion of problems that bring pt to therapy; pt reports feeling very distressed over HPV diagnosis received Feb 2012, and has experienced significant anxiety at medical appointments related to this and when having related procedures (e.g., colposcopy). Pt reports that she was very upset when initially diagnosed (did not sleep the first night after receiving the information, cried every day the first week) and continues to have frequent distressing or anxious thoughts about HPV. Therapist asked pt about similar problems, pt reports high anxiety related to other health situations/procedures that occurred before the HPV diagnosis. Pt initially denied anxiety being triggered in non-medical situations, but later stated that she also has symptoms of anxiety in crowded rooms. Therapist provided brief psychoeducation about anxiety, normalized seeking treatment for anxiety in therapy and offered pt hope that anxiety can improve significantly with treatment. Planned with pt to further assess her background and symptoms in next session.    Level of progress this represents is: Moderate toward a goal of: assessment and goal setting/treatment planning.    Patient's GAF is estimated to be: 81     Patient completed homework/activity assigned from last session: N/A    Assessment of suicide suggested risk for self-harm was: Absent. Pt denies any SI or intent.  Based on this, the following action was taken: None    The following assessments were completed:  Pt did not arrive early enough to complete PHQ-9 and GAD-7. Pt agrees to bring these completed to next appt.    Patient has the following  caregiver status: None  Patient's physical disability status is: None  Patient's mental disability status is: None    Mental status of patient suggested  Mood was: Anxious  Affect was: Appropriate  Speech was: Clear/normal  Thought process was: Coherent  Visual hallucinations reported? No  Auditory hallucinations reported? No    Next scheduled session date/time is: 09/24/11 0900    I have read this encounter note and agree with therapist's activities and recommendations. Estevan Oaks, Ph.D.

## 2011-09-24 ENCOUNTER — Ambulatory Visit (INDEPENDENT_AMBULATORY_CARE_PROVIDER_SITE_OTHER): Payer: Commercial Managed Care - PPO | Admitting: Marriage & Family Therapist

## 2011-09-28 ENCOUNTER — Encounter (INDEPENDENT_AMBULATORY_CARE_PROVIDER_SITE_OTHER): Payer: Commercial Managed Care - PPO | Admitting: Family Medicine

## 2011-09-28 NOTE — Progress Notes (Signed)
Saw patient today for 50 Minutes for Individual therapy.              This was session number: 2  (for treatment of: Generalized Anxiety Disorder)    Treatment model used was: CBT    Further assessment and discussion of problems that bring pt to therapy. Pt reports no history of major depressive episodes. Pt reports much difficulty falling asleep for the last 6-7 years (since her son was an infant) due to anxious thoughts on a variety of topics, and as a result is getting an average of 4-5 hours sleep per night (typically from 2-3AM to 7AM), and always feels tired during the day. In addition to sleep disturbance and constant fatigue, pt reports often feeling anxious or on edge, frequent irritability, and "knots" in her neck due to muscle tension. Pt meets diagnostic criteria for GAD.    Discussed medication of Celexa 20 mg, originally prescribed by PCP Dr. Shane Crutch on 5/3; pt reports that she started taking the medication about 3 weeks ago, then self-discontinued it about 1 week ago, since she felt it "wasn't doing anything." Therapist provided psychoeducation about SSRIs and need for a trial of up to 3 months (for late responders) to determine if medication is helpful. Asked about pt's beliefs about the medication, helped pt label some thoughts about taking Celexa as anxious and/or inaccurate, and challenge them. Pt agrees to start taking Celexa again and to do so for 3 months to give it an adequate trial.    Level of progress this represents is: Significant toward a goal of: assessment and goal setting/treatment planning.    Patient's GAF is estimated to be: 65     Patient completed homework/activity assigned from last session: N/A    Assessment of suicide suggested risk for self-harm was: Absent  Based on this, the following action was taken: None    The following assessments were completed:    PHQ-9:  PHQ-9 Summary Score was : 8   Summary Score (manual - add Q1 thru 9): 8   Summary Score (calculated): 8     [facility]  PHQ9 DEPRESSION QUESTIONNAIRE 06/13/2010 01/09/2011 07/06/2011 09/24/2011   Interest 0 0 0 0   Depressed 0 0 0 0   Sleep -- -- -- 3   Energy -- -- -- 3   Appetite -- -- -- 0   Failure -- -- -- 2   Concentration -- -- -- 0   Movement -- -- -- 0   Suicide -- -- -- 0   Summary(Manual) -- -- -- 8   Summary(Calculated) -- -- -- 8   Functional -- -- -- 1       GAD 7 09/24/2011   Feeling afraid as if something awful might happen 3   Being easily annoyed or irritable 2   Worrying too much about different things 3   If you checked off any problems, how difficult have these problems made it for you to do your job along with other people? Somewhat difficult   Feeling nervous, anxious or on edge 3   Trouble relaxing 3   Being so restless that it is hard to sit still 0   Total 17   Not being able to stop or control worrying 3       Patient has the following caregiver status: None  Patient's physical disability status is: None  Patient's mental disability status is: None    Mental status of patient suggested  Mood was: Anxious  Affect was: Appropriate  Speech was: Clear/normal  Thought process was: Coherent  Visual hallucinations reported? No  Auditory hallucinations reported? No    Next scheduled session date/time is: 10/08/11 0900  I have read this encounter note and agree with therapist's activities and recommendations. Estevan Oaks, Ph.D.

## 2011-09-29 ENCOUNTER — Other Ambulatory Visit (INDEPENDENT_AMBULATORY_CARE_PROVIDER_SITE_OTHER): Payer: Commercial Managed Care - PPO

## 2011-10-07 ENCOUNTER — Encounter (INDEPENDENT_AMBULATORY_CARE_PROVIDER_SITE_OTHER): Payer: Self-pay | Admitting: Family Medicine

## 2011-10-07 ENCOUNTER — Ambulatory Visit (INDEPENDENT_AMBULATORY_CARE_PROVIDER_SITE_OTHER): Payer: Commercial Managed Care - PPO | Admitting: Family Medicine

## 2011-10-07 VITALS — BP 114/77 | HR 84 | Temp 97.9°F | Resp 16 | Ht 61.0 in | Wt 179.0 lb

## 2011-10-07 MED ORDER — CITALOPRAM HYDROBROMIDE 20 MG OR TABS
20.0000 mg | ORAL_TABLET | ORAL | Status: DC
Start: 2011-10-07 — End: 2011-11-04

## 2011-10-07 MED ORDER — ONDANSETRON 4 MG OR TBDP
4.0000 mg | ORAL_TABLET | Freq: Three times a day (TID) | ORAL | Status: DC | PRN
Start: 2011-10-07 — End: 2014-07-25

## 2011-10-07 MED ORDER — SUMATRIPTAN SUCCINATE 25 MG OR TABS
25.0000 mg | ORAL_TABLET | Freq: Once | ORAL | Status: DC | PRN
Start: 2011-10-07 — End: 2014-07-25

## 2011-10-07 MED ORDER — AMITRIPTYLINE HCL 10 MG OR TABS
10.0000 mg | ORAL_TABLET | Freq: Every evening | ORAL | Status: DC
Start: 2011-10-07 — End: 2014-07-25

## 2011-10-08 ENCOUNTER — Encounter (INDEPENDENT_AMBULATORY_CARE_PROVIDER_SITE_OTHER): Payer: Commercial Managed Care - PPO | Admitting: Marriage & Family Therapist

## 2011-10-08 ENCOUNTER — Ambulatory Visit (INDEPENDENT_AMBULATORY_CARE_PROVIDER_SITE_OTHER): Payer: Commercial Managed Care - PPO | Admitting: Marriage & Family Therapist

## 2011-10-08 NOTE — Progress Notes (Signed)
CC: Ruth Hall is a 26 year old female here for follow up depression visit.    Subjective: here for follow up      Pharmacotherapy   Yes    Collaborative Care or other therapy:  Yes      [facility] PHQ9 DEPRESSION QUESTIONNAIRE 06/13/2010 01/09/2011 07/06/2011 09/24/2011   Interest 0 0 0 0   Depressed 0 0 0 0   Sleep -- -- -- 3   Energy -- -- -- 3   Appetite -- -- -- 0   Failure -- -- -- 2   Concentration -- -- -- 0   Movement -- -- -- 0   Suicide -- -- -- 0   Summary(Manual) -- -- -- 8   Summary(Calculated) -- -- -- 8   Functional -- -- -- 1         Barriers: no    Current Outpatient Prescriptions on File Prior to Visit   Medication Sig Dispense Refill   . amitriptyline (ELAVIL) 10 MG tablet Take 1 tablet by mouth nightly. To prevent migraines  30 tablet  1   . citalopram (CELEXA) 20 MG tablet Take 1 tablet by mouth As Directed.  30 tablet  1   . phentermine (ADIPEX-P) 37.5 MG tablet Take 37.5 mg by mouth every morning (before breakfast).       . SUMAtriptan (IMITREX) 25 MG tablet Take 1 tablet by mouth once as needed for Migraine. May repeat in 2 hours in needed  10 tablet  0     No current facility-administered medications on file prior to visit.       Objective:  Filed Vitals:    10/07/11 1015   BP: 114/77   Pulse: 84   Temp: 97.9 F (36.6 C)   Resp: 16       Gen: alert, cooperative  MSE: Eye Contact: normal  Attention Span: good  Speech: normal volume, rate, and pitch  Mood (pt's report) :anxiety  Affect: full and appropriate    Assessment/Plan:    1. Migraine (346.90)  amitriptyline (ELAVIL) 10 MG tablet, SUMAtriptan (IMITREX) 25 MG tablet, ondansetron (ZOFRAN ODT) 4 MG disintegrating tablet   2. Anxiety (300.00)  citalopram (CELEXA) 20 MG tablet       Education and Case Management:  Goals of depression management reviewed with patient.  Barriers to achieving goals reviewed and addressed with patient.  Need for referral to [facility], collaborative care and/or case management assessed and addressed.     Medication  Management:  Medications reviewed with patient and medication list reconciled.  Over the counter medications, herbal therapies and supplements reviewed.  Patient's understanding and response to medications assessed.    Barriers to medications assessed and addressed.  Risks, benefits, alternatives to medications reviewed.

## 2011-10-15 ENCOUNTER — Telehealth (INDEPENDENT_AMBULATORY_CARE_PROVIDER_SITE_OTHER): Payer: Self-pay | Admitting: Marriage & Family Therapist

## 2011-10-15 ENCOUNTER — Encounter (INDEPENDENT_AMBULATORY_CARE_PROVIDER_SITE_OTHER): Payer: Commercial Managed Care - PPO | Admitting: Marriage & Family Therapist

## 2011-10-15 NOTE — Telephone Encounter (Signed)
Received VM from pt this morning cancelling 9:00 a.m. CC appt today. In VM, pt requested that I call her to reschedule, asked to meet next Thursday morning. Attempted to call pt back at [phone], but was not able to leave a VM (reached automated msg stating "this is not a valid mail box number"). I sent an email to pt's address on file ([email or web address]) regarding reschedule options available, asked pt to please let me know what appt day/time best works with her schedule.

## 2011-10-16 NOTE — Progress Notes (Signed)
Saw patient today for 50 Minutes for Individual therapy.              This was session number: 3  (for treatment of: Generalized Anxiety Disorder)    Treatment model used was: CBT    Further assessment. Discussed themes of anxious thoughts, pt's typical physical symptoms when anxious (increased heart rate, "knot" in throat, stomach ache, crying, shaking, cold hands), and triggers for anxiety. Assessed pt's trauma history, exercise habits, and caffeine intake. Therapist notes that pt was unable to list any enjoyable activities that she pursues, inside or outside the home. Therapist and pt agreed to discuss treatment plan at next session; pt agreed to think about personal goals for therapy before then.    Level of progress this represents is: Significant toward a goal of: assessment and goal setting/treatment planning.    Patient's GAF is estimated to be: 73     Patient completed homework/activity assigned from last session: N/A    Assessment of suicide suggested risk for self-harm was: Absent  Based on this, the following action was taken: None    Patient has the following caregiver status: None  Patient's physical disability status is: None  Patient's mental disability status is: None    Mental status of patient suggested  Mood was: Anxious  Affect was: Appropriate  Speech was: Clear/normal  Thought process was: Coherent  Visual hallucinations reported? No  Auditory hallucinations reported? No    Next scheduled session date/time is: 10/15/11 0900    I have read this encounter note and agree with therapist's activities and recommendations. Estevan Oaks, Ph.D.

## 2011-10-20 NOTE — Telephone Encounter (Signed)
Exchanged further emails with pt confirming next CC appt for Thurs 8/1 at 9:00 a.m.

## 2011-10-29 ENCOUNTER — Ambulatory Visit (INDEPENDENT_AMBULATORY_CARE_PROVIDER_SITE_OTHER): Payer: Commercial Managed Care - PPO | Admitting: Marriage & Family Therapist

## 2011-11-04 ENCOUNTER — Other Ambulatory Visit (INDEPENDENT_AMBULATORY_CARE_PROVIDER_SITE_OTHER): Payer: Self-pay | Admitting: Family Medicine

## 2011-11-04 MED ORDER — CITALOPRAM HYDROBROMIDE 20 MG OR TABS
20.0000 mg | ORAL_TABLET | ORAL | Status: DC
Start: 2011-11-04 — End: 2014-07-25

## 2011-11-04 NOTE — Telephone Encounter (Signed)
Rx refill request from [facility] for Citalopram 20mg  # 30 1 po qd RF x 5  Last filled 09/25/11  Last visit 10/08/11 with Eartha Inch  Last visit Dr.Rosenblum 10/07/11  Rx pended and routed to MD

## 2011-11-05 ENCOUNTER — Ambulatory Visit (INDEPENDENT_AMBULATORY_CARE_PROVIDER_SITE_OTHER): Payer: Commercial Managed Care - PPO | Admitting: Marriage & Family Therapist

## 2011-11-05 NOTE — Progress Notes (Signed)
Saw patient today for 50 Minutes for Individual therapy.              This was session number: 5  (for treatment of: Generalized Anxiety Disorder)    Treatment model used was: CBT    Pt reports practicing cue-controlled relaxation exercise 7-8 times this week. Pt shared some difficulty with distracting thoughts, but reports that she consistently felt relaxed afterward, feels that she is gaining some skill with practice. Therapist normalized some distraction in novel activity of focusing and breathing, complimented pt on frequent practice and improvement in ability to relax. Asked pt to continue to practice in upcoming week, pt agrees to do so.    Therapist introduced anxiety log and rationale for logging. Completed sample entries with pt in session, including recording physical symptoms, anxious thoughts, emotions, and behavioral response. Therapist and pt noticed initial pattern of high anxiety and anger often going together, therapist asked pt to log anxiety and further observe self this week.    Level of progress this represents is: Minimal toward a goal of: reducing anxiety, irritability, and fatigue to improve wellbeing and social functioning.    Patient's GAF is estimated to be: 85     Patient completed homework/activity assigned from last session: Yes    Assessment of suicide suggested risk for self-harm was: Absent  Based on this, the following action was taken: None    Patient has the following caregiver status: None  Patient's physical disability status is: None  Patient's mental disability status is: None    Mental status of patient suggested  Mood was: Anxious  Affect was: Appropriate  Speech was: Clear/normal  Thought process was: Coherent  Visual hallucinations reported? No  Auditory hallucinations reported? No    Next scheduled session date/time is: 11/12/11 0900    I have read this encounter note and agree with therapist's activities and recommendations. Estevan Oaks, Ph.D.

## 2011-11-05 NOTE — Progress Notes (Signed)
Saw patient today for 50 Minutes for Individual therapy.              This was session number: 4  (for treatment of: Generalized Anxiety Disorder)    Treatment model used was: CBT    Discussed pt's reflections on priorities for therapy. Discussed treatment plan below.    The following goals were agreed upon:    Goal 1 - reducing frequency and severity of anxiety. Planned CBT approach, interventions likely to include relaxation skills training, use of anxiety log, identifying/changing anxious thinking, and systematic desensitization to triggers for anxiety.    Goal 2 - reducing irritability and fatigue to improve wellbeing and social functioning. Planned CBT/DBT approach, interventions likely to include helping pt gain skills in mindfulness, distress tolerance, and emotion regulation; and increasing pt's self-care, including sleep, enjoyable activities, and social support.    Pt consents to this treatment plan, therapist agrees to periodically re-assess progress toward goals.     Pt reports completing assigned activity of reading educational handout about anxiety. Discussed with pt which points from handout resonated with her; pt shared increased recognition of physical symptoms of anxiety. Therapist provided psychoeducation about role of relaxation skills in interrupting anxiety cycle and taught pt to breathe deeply from diaphragm/belly. Taught/practiced with pt cue-controlled relaxation technique. Pt agrees to practice this technique before next session.    Level of progress this represents is: Minimal toward a goal of: reducing anxiety, irritability, and fatigue to improve wellbeing and social functioning.    Patient's GAF is estimated to be: 75     Patient completed homework/activity assigned from last session: Yes    Assessment of suicide suggested risk for self-harm was: Absent  Based on this, the following action was taken: None    Patient has the following caregiver status: None  Patient's physical disability  status is: None  Patient's mental disability status is: None    Mental status of patient suggested  Mood was: Anxious  Affect was: Appropriate  Speech was: Clear/normal  Thought process was: Coherent  Visual hallucinations reported? No  Auditory hallucinations reported? No    Next scheduled session date/time is: 11/05/11 0900    I have read this encounter note and agree with therapist's activities and recommendations. Estevan Oaks, Ph.D.

## 2011-11-12 ENCOUNTER — Ambulatory Visit (INDEPENDENT_AMBULATORY_CARE_PROVIDER_SITE_OTHER): Payer: Commercial Managed Care - PPO | Admitting: Marriage & Family Therapist

## 2011-11-13 NOTE — Progress Notes (Signed)
Saw patient today for 50 Minutes for Individual therapy.              This was session number: 6  (for treatment of: Generalized Anxiety Disorder)    Treatment model used was: CBT    Pt reports a hectic week with much longer hours at work, states that this made it not possible to continue practicing cue-controlled relaxation exercise or keep anxiety log as agreed at previous session. Pt shared feeling stressed by frequent problem of being late to work; therapist asked questions to help pt identify structural problem with morning routine. Helped pt brainstorm two small changes that will support pt getting ready on time.     Therapist used anxiety log in session to increase pt's awareness of her response to one anxiety-provoking situation this week. Therapist introduced concept of automatic thoughts and used downward arrow technique to help pt identify underlying fear. Pt agrees to continue practicing diaphragmatic breathing this week and to make entries in anxiety log.    Level of progress this represents is: Minimal toward a goal of: reducing anxiety, irritability, and fatigue to improve wellbeing and social functioning.    Patient's GAF is estimated to be: 62     Patient completed homework/activity assigned from last session: No    Assessment of suicide suggested risk for self-harm was: Absent  Based on this, the following action was taken: None    Patient has the following caregiver status: None  Patient's physical disability status is: None  Patient's mental disability status is: None    Mental status of patient suggested  Mood was: Anxious  Affect was: Appropriate  Speech was: Clear/normal  Thought process was: Coherent  Visual hallucinations reported? No  Auditory hallucinations reported? No    Next scheduled session date/time is: 11/19/11 0900    I have read this encounter note and agree with therapist's activities and recommendations. Estevan Oaks, Ph.D.

## 2011-11-19 ENCOUNTER — Encounter (INDEPENDENT_AMBULATORY_CARE_PROVIDER_SITE_OTHER): Payer: Commercial Managed Care - PPO | Admitting: Marriage & Family Therapist

## 2011-11-26 ENCOUNTER — Ambulatory Visit (INDEPENDENT_AMBULATORY_CARE_PROVIDER_SITE_OTHER): Payer: Commercial Managed Care - PPO | Admitting: Marriage & Family Therapist

## 2011-11-27 NOTE — Progress Notes (Signed)
Saw patient today for 50 Minutes for Individual therapy.              This was session number: 7  (for treatment of: Generalized Anxiety Disorder)    Treatment model used was: CBT / Bowen approach    Pt reports feeling less stressed this week due to changing morning schedule in accordance with ideas discussed at previous session; therapist highlighted and reinforced pt's success in implementing changes. Discussed pt's experience with using anxiety log this week. Therapist used downward arrow technique in session to increase pt's awareness of automatic thoughts fueling anxiety. Explored close connection between anxiety and anger, since pt frequently experiences these emotions together. Pt agrees to continue practicing diaphragmatic breathing this week and to make more entries in anxiety log.    Level of progress this represents is: Minimal toward a goal of: reducing anxiety, irritability, and fatigue to improve wellbeing and social functioning.    Patient's GAF is estimated to be: 55     Patient completed homework/activity assigned from last session: Partial    Assessment of suicide suggested risk for self-harm was: Absent  Based on this, the following action was taken: None    Patient has the following caregiver status: None  Patient's physical disability status is: None  Patient's mental disability status is: None    Mental status of patient suggested  Mood was: Anxious  Affect was: Appropriate  Speech was: Clear/normal  Thought process was: Coherent  Visual hallucinations reported? No  Auditory hallucinations reported? No    Next scheduled session date/time is: 12/03/11 0900    I have read this encounter note and agree with therapist's activities and recommendations. Estevan Oaks, Ph.D.

## 2011-12-03 ENCOUNTER — Encounter (INDEPENDENT_AMBULATORY_CARE_PROVIDER_SITE_OTHER): Payer: Commercial Managed Care - PPO | Admitting: Marriage & Family Therapist

## 2011-12-10 ENCOUNTER — Ambulatory Visit (INDEPENDENT_AMBULATORY_CARE_PROVIDER_SITE_OTHER): Payer: Commercial Managed Care - PPO | Admitting: Marriage & Family Therapist

## 2011-12-17 ENCOUNTER — Ambulatory Visit (INDEPENDENT_AMBULATORY_CARE_PROVIDER_SITE_OTHER): Payer: Commercial Managed Care - PPO | Admitting: Marriage & Family Therapist

## 2011-12-19 NOTE — Progress Notes (Signed)
Saw patient today for 45 Minutes for Individual therapy.              This was session number: 8  (for treatment of: Generalized Anxiety Disorder)    Treatment model used was: CBT    Discussed one anxious situation this week in which pt successfully used diaphragmatic breathing to interrupt anxiety cycle, lowering anxiety from a 7 to a 5 on 1-10 scale. Therapist notes that this is pt's first attempt to use breathing to calm self in a high anxiety situation, pt shared feeling good about novel experience of having some control over anxiety. Reviewed pt's entries in anxiety log. Therapist introduced concept of cognitive distortions, used anxiety log entries to help pt identify her typical distortions as catastrophizing, emotional reasoning, and negative filter.    Level of progress this represents is: Moderate toward a goal of: reducing anxiety, irritability, and fatigue to improve wellbeing and social functioning.    Patient's GAF is estimated to be: 29     Patient completed homework/activity assigned from last session: Yes    Assessment of suicide suggested risk for self-harm was: Absent  Based on this, the following action was taken: None    Patient has the following caregiver status: None  Patient's physical disability status is: None  Patient's mental disability status is: None    Mental status of patient suggested  Mood was: Anxious  Affect was: Appropriate  Speech was: Clear/normal  Thought process was: Coherent  Visual hallucinations reported? No  Auditory hallucinations reported? No    Next scheduled session date/time is: 12/17/11 0900    I have read this encounter note and agree with therapist's activities and recommendations. Estevan Oaks, Ph.D.

## 2011-12-28 NOTE — Progress Notes (Signed)
Saw patient today for 45 Minutes for Individual therapy.              This was session number: 9  (for treatment of: Generalized Anxiety Disorder)    Treatment model used was: CBT    Discussed pt's experience of completing activity at home this week to identify automatic anxious thoughts related to one situation. Regarding one major theme of anxiety, therapist used worksheet in session to help pt lead self through process of identifying automatic thought and relevant cognitive distortions (catastrophizing, emotional reasoning) and then generating multiple alternative/realistic thoughts. Therapist notes that generating alternative thoughts was difficult for pt, therapist coached pt in using evidence to challenge anxious cognitions.    Level of progress this represents is: Moderate toward a goal of: reducing anxiety, irritability, and fatigue to improve wellbeing and social functioning.    Patient's GAF is estimated to be: 8     Patient completed homework/activity assigned from last session: Yes    Assessment of suicide suggested risk for self-harm was: Absent  Based on this, the following action was taken: None    Patient has the following caregiver status: None  Patient's physical disability status is: None  Patient's mental disability status is: None    Mental status of patient suggested  Mood was: Anxious  Affect was: Appropriate  Speech was: Clear/normal  Thought process was: Coherent  Visual hallucinations reported? No  Auditory hallucinations reported? No    Next scheduled session date/time is: 12/31/11 0900

## 2011-12-31 ENCOUNTER — Encounter (INDEPENDENT_AMBULATORY_CARE_PROVIDER_SITE_OTHER): Payer: Commercial Managed Care - PPO | Admitting: Marriage & Family Therapist

## 2012-07-19 ENCOUNTER — Encounter (HOSPITAL_BASED_OUTPATIENT_CLINIC_OR_DEPARTMENT_OTHER): Payer: Self-pay

## 2012-07-20 ENCOUNTER — Other Ambulatory Visit
Admission: RE | Admit: 2012-07-20 | Discharge: 2012-07-20 | Disposition: A | Discharge status: Home / Self Care | Payer: Federal, State, Local not specified - PPO | Source: Ambulatory Visit | Attending: Medical | Admitting: Medical

## 2012-07-20 ENCOUNTER — Telehealth: Payer: Self-pay

## 2012-07-20 DIAGNOSIS — Z52001 Unspecified donor, stem cells: Secondary | ICD-10-CM | POA: Insufficient documentation

## 2012-07-20 LAB — ABO/RH: ABO/RH: A POS

## 2012-07-20 NOTE — Telephone Encounter (Signed)
Possible donor for recipient Herlinda Marmon MR# [id number].

## 2012-07-25 LAB — HLA-A,B,C TYPING (CLASS I), HIGH RES, BLOOD

## 2012-07-25 LAB — HLA-DR, DQ, DP TYPING (CLASS II), HIGH RES, BLOOD

## 2012-08-15 ENCOUNTER — Encounter (INDEPENDENT_AMBULATORY_CARE_PROVIDER_SITE_OTHER): Payer: Self-pay | Admitting: Family Medicine

## 2013-05-07 ENCOUNTER — Encounter (INDEPENDENT_AMBULATORY_CARE_PROVIDER_SITE_OTHER): Payer: Self-pay | Admitting: Family Medicine

## 2014-03-12 ENCOUNTER — Encounter (INDEPENDENT_AMBULATORY_CARE_PROVIDER_SITE_OTHER): Payer: Self-pay | Admitting: Family Medicine

## 2014-03-21 ENCOUNTER — Encounter (INDEPENDENT_AMBULATORY_CARE_PROVIDER_SITE_OTHER): Payer: Self-pay | Admitting: Family Medicine

## 2014-03-21 ENCOUNTER — Other Ambulatory Visit: Payer: HMO | Attending: Family Medicine | Admitting: Family Medicine

## 2014-03-21 VITALS — BP 127/86 | HR 83 | Temp 98.8°F | Resp 16 | Ht 61.0 in | Wt 187.0 lb

## 2014-03-21 DIAGNOSIS — Z124 Encounter for screening for malignant neoplasm of cervix: Secondary | ICD-10-CM | POA: Insufficient documentation

## 2014-03-21 DIAGNOSIS — N750 Cyst of Bartholin's gland: Principal | ICD-10-CM | POA: Insufficient documentation

## 2014-03-21 LAB — VAGINOSIS SCREEN
Candida, Vaginal Screen: NEGATIVE
Gardnerella, Vaginal Screen: NEGATIVE
Trichomonas, Vaginal Screen: NEGATIVE

## 2014-03-21 NOTE — Progress Notes (Signed)
28yo female, presents with 'bump' in vaginal area  No new sexual partners.  Not painful, no discharge, no odor.  Has not followed up for Pap smear, too upset about HPV to come back for follow up    I reviewed the patient's medical record, including PMH, SH, medications, allergies, and ROS.    BP 127/86 mmHg   Pulse 83   Temp(Src) 98.8 F (37.1 C) (Oral)   Resp 16   Ht 5\' 1"  (1.549 m)   Wt 84.823 kg (187 lb)   BMI 35.35 kg/m2   SpO2 99%   LMP 03/07/2014    Physical Exam:   General Appearance: healthy, alert, no distress, pleasant affect, cooperative.  Gyn:  Right Bartholins' cyst.  Speculum minimal discharge.    ASSESSMENT/PLAN:    Ruth Hall was seen today for gyn exam.    Diagnoses and associated orders for this visit:    Pap smear for cervical cancer screening  - Cytopath Gyn (Pap Smear)    Bartholin gland cyst  - Vaginosis Screen Sterile Container  - [facility]

## 2014-03-21 NOTE — Interdisciplinary (Signed)
Pre-visit chart review and huddle completed with staff and physician.    Outstanding labs, imaging and consults reviewed and identified.    Health maintanence issues identified and addressed:    Health Maintenance   Topic Date Due    INFLUENZA VACCINE  10/28/2013    CERVICAL CANCER SCREENING  07/06/2014    IMM_TD/TDAP=>28 YO  05/05/2020

## 2014-03-27 ENCOUNTER — Encounter (INDEPENDENT_AMBULATORY_CARE_PROVIDER_SITE_OTHER): Payer: Self-pay | Admitting: Family Medicine

## 2014-03-27 DIAGNOSIS — Z124 Encounter for screening for malignant neoplasm of cervix: Principal | ICD-10-CM

## 2014-03-27 NOTE — Procedures (Signed)
SPECIMENS SUBMITTED:  Cervical (Pap) Smear;Thin-Prep Vial Received    CLINICAL INFORMATION:   No LMP Given; Other Clinical Findings: History of abnormal pap smear,  dysplasia, cervical cancer within 5 years.  FINAL CYTOLOGIC INTERPRETATION:  No Atypical or Malignant Cells    COMMENT:  Previous cytology reviewed    SPECIMEN ADEQUACY:  Satisfactory for evaluation  The Pap smear is a screening test with an inherent low error rate. Although  a normal [negative] result is highly predictive of the absence of cervical  cancer and its precursor lesions, it does not exclude the presence of  significant disease. Results must be evaluated within the context of the  individual patient.  CONFIDENTIAL HEALTH INFORMATION: Health Care information is personal and  sensitive information. If it is being faxed to you it is done so under  appropriate authorization from the patient or under circumstances that do  not require patient authorization. You, the recipient, are obligated to  maintain it in a safe, secure and confidential manner. Re-disclosure  without additional patient consent or as permitted by law is prohibited.  Unauthorized re-disclosure or failure to maintain confidentiality could  subject you to penalties described in federal and state law.  If you have  received this report or facsimile in error, please notify the [facility] immediately and destroy the received document(s).    Material reviewed and Interpreted and  Report Electronically Signed by:  Priscille Kluver. Philbert Riser CT (ASCP)  Sr. Cytotechnologist  03/27/14 06:10  Electronic Signature derived from a single  controlled access password

## 2014-04-20 ENCOUNTER — Telehealth (INDEPENDENT_AMBULATORY_CARE_PROVIDER_SITE_OTHER): Payer: Self-pay | Admitting: Family Medicine

## 2014-04-20 NOTE — Telephone Encounter (Signed)
Chief complaint: pt's mom called requesting to speak with nurse for advice in regards to pt having stomach ache. Mom states due to pt having diarrhea she is having some bleeding around her anus and is painful.  Onset: 1 week  Duration: on-going  Treatments/Remedies (for onset symptoms only): none  Fever (request temp reading): no  Pain Level from 0-10: no  Location of Pain (If 0, please note N/A): n/a  Shortness of Breath: no  Additional info:

## 2014-04-20 NOTE — Telephone Encounter (Signed)
Tried to call pt but no answer. Called pt mother and she answered.  Last Sunday she had stomach pain felt like she needed to vomit. She vomited. No hematemesis reported.  Then Monday she had chills, abd pain, with 100.2 fever.  No more body pain, no fever at this time.  Afraid to eat because she continues to get diarrhea.  And when she eats she gets abd pain.  Able to drink 7up and gatorade.  Advised mother because she is having pain when she eats she should be evaluated. Because not with severe sx's right now she should be seen at a [facility].  If developed dizziness or lightheadedness need to go to ER for IV fluids most likely.  Mother also noted pt has been on abx rx'd by dentist for recent teeth work.   RN advised these can cause issues but still if pt can't tolerate food then need to be evaluated.  Mother aware to take her [place] when she gets off of work at [employer]. Or tomorrow. Aware of ED precautions as well.  Verbalizes understanding and agrees with plan of care.   Daine Gip RN

## 2014-04-30 ENCOUNTER — Encounter (INDEPENDENT_AMBULATORY_CARE_PROVIDER_SITE_OTHER): Payer: HMO | Admitting: Family Medicine

## 2014-07-03 ENCOUNTER — Telehealth (INDEPENDENT_AMBULATORY_CARE_PROVIDER_SITE_OTHER): Payer: Self-pay | Admitting: Family Medicine

## 2014-07-03 NOTE — Telephone Encounter (Signed)
1. What facility was patient treated at? [facility] ED/No address or phone number of location (but patient does not have paperwork from that visit)   - (Facility Name, Address, Phone and Fax#)    2. Date patient was treated? 07/01/2014    3. Reason patient was treated? A cut on right wrist from glass. Patient has 7 stitches    (patient is being seen with Dr Rickey Barbara on 07/04/2014 @ 4:00 pm)

## 2014-07-04 ENCOUNTER — Encounter (INDEPENDENT_AMBULATORY_CARE_PROVIDER_SITE_OTHER): Payer: Self-pay | Admitting: Family Medicine

## 2014-07-04 ENCOUNTER — Ambulatory Visit (INDEPENDENT_AMBULATORY_CARE_PROVIDER_SITE_OTHER): Payer: HMO | Admitting: Family Medicine

## 2014-07-04 VITALS — BP 131/86 | HR 85 | Temp 98.2°F | Resp 16 | Ht 61.0 in | Wt 196.0 lb

## 2014-07-04 DIAGNOSIS — IMO0002 Reserved for concepts with insufficient information to code with codable children: Secondary | ICD-10-CM

## 2014-07-04 DIAGNOSIS — S41101D Unspecified open wound of right upper arm, subsequent encounter: Principal | ICD-10-CM

## 2014-07-04 DIAGNOSIS — T148 Other injury of unspecified body region: Secondary | ICD-10-CM

## 2014-07-04 NOTE — Interdisciplinary (Signed)
Pre-visit chart review and huddle completed with staff and physician.    Outstanding labs, imaging and consults reviewed and identified.    Health maintanence issues identified and addressed:    Health Maintenance   Topic Date Due    INFLUENZA VACCINE  10/29/2014    CERVICAL CANCER SCREENING  03/21/2017    IMM_TD/TDAP=>29 YO  05/05/2020

## 2014-07-04 NOTE — Progress Notes (Signed)
ER F/U      Subjective:    HPI: Ruth Hall is a 29 year old female complaining of right wrist laceration.   She was cleaning her fish tank on Sunday and she pushed the fish tank and the glass broke and her hand went though.   She went to ED , she had 7 stiches and she is here for follow up.   She is wearing a wrist splint today.   She is taking Ibuprofen OTC and pain is well control  She is cleaning the wound daily as instructed and keeps it clean and cover  No discharge, no bleeding.   Minimal pain controlled with Ibuprofen    Td 2008       I reviewed patient's past medical history, surgeries, family history, social history, medications and allergies as below.        ROS:  General: No fatigue,body weight changes, fevers or chills.  HEENT: No headache, vision loss, blurry vision, hearing loss, sore throat or nasal discharge.   CV: No chest pain, palpitations or orthopnea.  Pulm: No cough, SOB, wheezes or dyspnea.  GI: No abdominal pain, nausea, vomiting, diarrhea or constipation.    GU: No dysuria, bleeding or hematuria.   Musculoskeletal: No joint pain, swelling, stiffness or gout.   Neuro: No dizziness, syncope or seizures.  Heme: No claudication, anemia, or cramps.  Vascular: No varicose veins, no claudication.   Endo: No heat-cold intolerance, diabetes symptoms.   Derm: Skin laceration   Psych: No depression, anxiety,Insomnia, hallucinations or SI/HI    Patient Active Problem List   Diagnosis    Morbid obesity ([place])    HYPERTRIGLYCERIDEMIA    ASCUS (atypical squamous cells of undetermined significance) on Pap smear     Current Outpatient Prescriptions on File Prior to Visit   Medication Sig Dispense Refill    amitriptyline (ELAVIL) 10 MG tablet Take 1 tablet by mouth nightly. To prevent migraines 30 tablet 1    citalopram (CELEXA) 20 MG tablet Take 1 tablet by mouth As Directed. 30 tablet 5    ondansetron (ZOFRAN ODT) 4 MG disintegrating tablet Take 1 tablet by mouth every 8 hours as needed for  Nausea/Vomiting (with migraines). 10 tablet 0    phentermine (ADIPEX-P) 37.5 MG tablet Take 37.5 mg by mouth every morning (before breakfast).      SUMAtriptan (IMITREX) 25 MG tablet Take 1 tablet by mouth once as needed for Migraine. May repeat in 2 hours in needed 10 tablet 0     No current facility-administered medications on file prior to visit.     Allergies   Allergen Reactions    Penicillins Hives     Past Medical History   Diagnosis Date    HPV in female      Past Surgical History   Procedure Laterality Date    Cholecystectomy, lap  2006    C-section  2005     History     Social History    Marital Status: Single     Spouse Name: N/A    Number of Children: N/A    Years of Education: N/A     Occupational History    receptionist      Social History Main Topics    Smoking status: Never Smoker     Smokeless tobacco: Never Used    Alcohol Use: No    Drug Use: No    Sexual Activity:     Partners: Male     Birth  Control/ Protection: Pill      Comment: Ortho-Evra patch     Other Topics Concern    Not on file     Social History Narrative    Lives with mom, dad, grandparents, 96yo son    Cats, dogs, [place] pig, fish       Objective:  BP 131/86 mmHg   Pulse 85   Temp(Src) 98.2 F (36.8 C) (Oral)   Resp 16   Ht 5\' 1"  (1.549 m)   Wt 88.905 kg (196 lb)   BMI 37.05 kg/m2   SpO2 100%Body mass index is 37.05 kg/(m^2).  General: Alert , Oriented, NAD \  Right wrist medial aspect 34 cm laceration radial aspect/ 7 stiches, healing well.   Minimal redness  No discharge  No bleeding   No areas TTP     Changed dressing today with new Telfa gauze and bacitracin       Assessment and Plan:    Encounter Diagnoses   Name Primary?    Arm wound, right, subsequent encounter Yes    Laceration        Local care   Keep area clean and dry   Can wear brace at work but encourage to keep it open at home   Continue Antibiotic ointment topical   Td up to date   Follow up for suture removal Friday PM and PRN     Medication  Review:  Medications reviewed with patient and medication list reconciled.  Over the counter medications, herbal therapies and supplements reviewed.  Patient's understanding and response to medications assessed.    Barriers to medications assessed and addressed.   Risks, benefits, alternatives to medications reviewed      Plan discussed including risks and benefits.   Detailed ER precautions reviewed   Notified of 24/7 on call doctor  Patient agrees to the plan and verbalizes understanding.        Glade Stanford, MD

## 2014-07-06 ENCOUNTER — Encounter (INDEPENDENT_AMBULATORY_CARE_PROVIDER_SITE_OTHER): Payer: Self-pay | Admitting: Family Medicine

## 2014-07-06 ENCOUNTER — Ambulatory Visit (INDEPENDENT_AMBULATORY_CARE_PROVIDER_SITE_OTHER): Payer: HMO | Admitting: Family Medicine

## 2014-07-06 VITALS — BP 135/83 | HR 81 | Temp 97.8°F | Resp 18 | Wt 195.0 lb

## 2014-07-06 DIAGNOSIS — Z4802 Encounter for removal of sutures: Principal | ICD-10-CM

## 2014-07-06 NOTE — Progress Notes (Signed)
Suture Removal      Subjective:    HPI: Ruth Hall is a 29 year old female for suture removal.   She is doing well.   Minimal pain  Some itchiness   No bleeding, no discharge.         I reviewed patient's past medical history, surgeries, family history, social history, medications and allergies as below.        ROS:  General: No fatigue,body weight changes, fevers or chills.  HEENT: No headache, vision loss, blurry vision, hearing loss, sore throat or nasal discharge.   CV: No chest pain, palpitations or orthopnea.  Pulm: No cough, SOB, wheezes or dyspnea.  GI: No abdominal pain, nausea, vomiting, diarrhea or constipation.    GU: No dysuria, bleeding or hematuria.   Musculoskeletal: No joint pain, swelling, stiffness or gout.   Neuro: No dizziness, syncope or seizures.  Heme: No claudication, anemia, or cramps.  Vascular: No varicose veins, no claudication.   Endo: No heat-cold intolerance, diabetes symptoms.   Derm:right arm laceration status post repair.      Patient Active Problem List   Diagnosis    Morbid obesity ([place])    HYPERTRIGLYCERIDEMIA    ASCUS (atypical squamous cells of undetermined significance) on Pap smear     Current Outpatient Prescriptions on File Prior to Visit   Medication Sig Dispense Refill    amitriptyline (ELAVIL) 10 MG tablet Take 1 tablet by mouth nightly. To prevent migraines 30 tablet 1    citalopram (CELEXA) 20 MG tablet Take 1 tablet by mouth As Directed. 30 tablet 5    ondansetron (ZOFRAN ODT) 4 MG disintegrating tablet Take 1 tablet by mouth every 8 hours as needed for Nausea/Vomiting (with migraines). 10 tablet 0    phentermine (ADIPEX-P) 37.5 MG tablet Take 37.5 mg by mouth every morning (before breakfast).      SUMAtriptan (IMITREX) 25 MG tablet Take 1 tablet by mouth once as needed for Migraine. May repeat in 2 hours in needed 10 tablet 0     No current facility-administered medications on file prior to visit.     Allergies   Allergen Reactions    Penicillins  Hives     Past Medical History   Diagnosis Date    HPV in female      Past Surgical History   Procedure Laterality Date    Cholecystectomy, lap  2006    C-section  2005     History     Social History    Marital Status: Single     Spouse Name: N/A    Number of Children: N/A    Years of Education: N/A     Occupational History    receptionist      Social History Main Topics    Smoking status: Never Smoker     Smokeless tobacco: Never Used    Alcohol Use: No    Drug Use: No    Sexual Activity:     Partners: Male     Patent examiner Protection: Pill      Comment: Radiation protection practitioner     Other Topics Concern    Not on file     Social History Narrative    Lives with mom, dad, grandparents, 60yo son    Cats, dogs, [place] pig, fish       Objective:  BP 135/83 mmHg   Pulse 81   Temp(Src) 97.8 F (36.6 C) (Oral)   Resp 18   Wt 88.451 kg (  195 lb)Body mass index is 36.86 kg/(m^2).  General: Alert , Oriented, NAD   Right arm wrist laceration with 7 sutures. Healing well, dry , clean, no signs of infection.   Cleaned area and removed all 7 stiches without complications. Place 5 sterile trips over wound.   Patient tolerated procedure well. No complications.   Instructed patient on daily care     Wrist and fingers fulls ROM. No neuro deficit.     Assessment and Plan:    Encounter Diagnoses   Name Primary?    Visit for suture removal Yes     Keep area clean and dry   Do not remove strips, they will fall on its own within one week.   Discussed signs of infection and complications     Follow up PRN     Medication Review:  Medications reviewed with patient and medication list reconciled.  Over the counter medications, herbal therapies and supplements reviewed.  Patient's understanding and response to medications assessed.    Barriers to medications assessed and addressed.   Risks, benefits, alternatives to medications reviewed      Plan discussed including risks and benefits.   Detailed ER precautions reviewed   Notified of  24/7 on call doctor  Patient agrees to the plan and verbalizes understanding.        Glade Stanford, MD

## 2014-07-25 ENCOUNTER — Ambulatory Visit (INDEPENDENT_AMBULATORY_CARE_PROVIDER_SITE_OTHER): Payer: HMO | Admitting: Family Medicine

## 2014-07-25 VITALS — BP 118/88 | HR 83 | Temp 98.2°F | Resp 16 | Ht 61.0 in | Wt 192.5 lb

## 2014-07-25 DIAGNOSIS — L910 Hypertrophic scar: Principal | ICD-10-CM

## 2014-07-25 NOTE — Progress Notes (Signed)
29yo female, here for follow up of right hand injury.  Cut on fish tank, had sutures removed 07/06/14.  Yesterday, noted some fluid oozing from scar.   No fever/chils. No redness, no pain.    I reviewed the patient's medical record, including PMH, SH, medications, allergies, and ROS.    BP 118/88 mmHg   Pulse 83   Temp(Src) 98.2 F (36.8 C) (Oral)   Resp 16   Ht 5\' 1"  (1.549 m)   Wt 87.317 kg (192 lb 8 oz)   BMI 36.39 kg/m2   LMP 06/24/2014    Physical Exam: General Appearance: healthy, alert, no distress, pleasant affect, cooperative.  Skin:  Healing scar, small keloid forming in middle region. No erythema, no exudate, no foul odor.    ASSESSMENT/PLAN:    Ruth Hall was seen today for er f/u.    Diagnoses and all orders for this visit:    Keloid scar  No current evidence of infection. Notify MD if symptoms return/worsen or if keloid enlarges.

## 2014-07-25 NOTE — Interdisciplinary (Signed)
Pre-visit chart review and huddle completed with staff and physician.    Outstanding labs, imaging and consults reviewed and identified.    Health maintanence issues identified and addressed:    Health Maintenance   Topic Date Due    INFLUENZA VACCINE  10/29/2014    CERVICAL CANCER SCREENING  03/21/2017    IMM_TD/TDAP=>29 YO  05/05/2020

## 2014-11-29 ENCOUNTER — Encounter (INDEPENDENT_AMBULATORY_CARE_PROVIDER_SITE_OTHER): Payer: Self-pay | Admitting: Family Medicine

## 2014-11-29 ENCOUNTER — Other Ambulatory Visit: Payer: HMO | Attending: Family Medicine | Admitting: Family Medicine

## 2014-11-29 VITALS — BP 122/79 | HR 80 | Temp 98.0°F | Resp 18 | Ht 61.0 in | Wt 197.0 lb

## 2014-11-29 DIAGNOSIS — F419 Anxiety disorder, unspecified: Secondary | ICD-10-CM

## 2014-11-29 DIAGNOSIS — N915 Oligomenorrhea, unspecified: Principal | ICD-10-CM | POA: Insufficient documentation

## 2014-11-29 DIAGNOSIS — R638 Other symptoms and signs concerning food and fluid intake: Secondary | ICD-10-CM

## 2014-11-29 DIAGNOSIS — Z6835 Body mass index (BMI) 35.0-35.9, adult: Secondary | ICD-10-CM | POA: Insufficient documentation

## 2014-11-29 DIAGNOSIS — Z6837 Body mass index (BMI) 37.0-37.9, adult: Secondary | ICD-10-CM

## 2014-11-29 LAB — COMPREHENSIVE METABOLIC PANEL, BLOOD
ALT (SGPT): 27 U/L (ref 0–33)
AST (SGOT): 29 U/L (ref 0–32)
Albumin: 4.2 g/dL (ref 3.5–5.2)
Alkaline Phos: 87 U/L (ref 35–140)
Anion Gap: 16 mmol/L — ABNORMAL HIGH (ref 7–15)
BUN: 10 mg/dL (ref 6–20)
Bicarbonate: 25 mmol/L (ref 22–29)
Bilirubin, Tot: 0.51 mg/dL (ref <1.20)
Calcium: 9.2 mg/dL (ref 8.5–10.6)
Chloride: 100 mmol/L (ref 98–107)
Creatinine: 0.86 mg/dL (ref 0.51–0.95)
GFR: >60 mL/min
Glucose: 110 mg/dL — ABNORMAL HIGH (ref 70–99)
Potassium: 4.3 mmol/L (ref 3.5–5.1)
Sodium: 141 mmol/L (ref 136–145)
Total Protein: 8.2 g/dL — ABNORMAL HIGH (ref 6.0–8.0)

## 2014-11-29 LAB — HCG QUANTITATIVE, BLOOD: HCG Qt: <1 m[IU]/mL

## 2014-11-29 LAB — LUTEINIZING HORMONE(LH), BLOOD: LH: 5.4 m[IU]/mL

## 2014-11-29 LAB — ESTRADIOL, BLOOD: Estradiol, Blood: 17 pg/mL

## 2014-11-29 LAB — LIPID(CHOL FRACT) PANEL, BLOOD
Cholesterol: 159 mg/dL (ref <200)
HDL-Cholesterol: 39 mg/dL
LDL-Chol (Calc): 68 mg/dL (ref <160)
Non-HDL Cholesterol: 120 mg/dL
Triglycerides: 261 mg/dL — ABNORMAL HIGH (ref 10–170)

## 2014-11-29 LAB — GLYCOSYLATED HGB(A1C), BLOOD: Glyco Hgb (A1C): 5.3 % (ref 4.8–5.9)

## 2014-11-29 LAB — DHEA SULFATE: DHEA Sulfate: 112 ug/dL (ref 99–340)

## 2014-11-29 LAB — PROLACTIN, BLOOD: Prolactin: 11.4 ng/mL

## 2014-11-29 LAB — TSH, BLOOD: TSH: 1.02 u[IU]/mL (ref 0.27–4.20)

## 2014-11-29 LAB — FOLLICLE STIMULATING HORMONE, BLOOD: FSH: 5.6 m[IU]/mL

## 2014-11-29 MED ORDER — CITALOPRAM HYDROBROMIDE 20 MG OR TABS
20.0000 mg | ORAL_TABLET | Freq: Every day | ORAL | 0 refills | Status: DC
Start: 2014-11-29 — End: 2017-09-22

## 2014-11-29 NOTE — Interdisciplinary (Signed)
Health Maintenance   Topic Date Due    INFLUENZA VACCINE  12/29/2014    CERVICAL CANCER SCREENING  03/21/2017    IMM_TD/TDAP=>29 YO  05/05/2020

## 2014-11-29 NOTE — Progress Notes (Signed)
Ruth Hall is a 29 year old G1P1001, who presents for irregular menses and inability to lose weight.  Thinks she is gaining weight  Around time she stopped her period.  Rickard Patience, trainer  Cold x 2 weeks.    Patient's last menstrual period was 11/27/2014.  Up until ~8 months ago, had menses regularly every month, last day of the month.  Now, has only had two periods in the past eight months. Started 2 days ago.  Feels she is always hot - at work, the only one sweating.    Also reports increased anxiety, would like to restart citalopram and reestablish with L. Copfer for counseling.    I reviewed the patient's medical record, including PMH, SH, medications, allergies, and ROS.    Vitals:    11/29/14 0951   BP: 122/79   Pulse: 80   Resp: 18   Temp: 98 F (36.7 C)   TempSrc: Oral   SpO2: 98%   Weight: 89.4 kg (197 lb)   Height: 5\' 1"  (1.549 m)     Physical Exam:   General Appearance: healthy, alert, no distress, pleasant affect, cooperative.  Neck:  Supple, no adenopathy, no thyromegaly  Heart:  normal rate and regular rhythm, no murmurs, clicks, or gallops.  Lungs: clear to auscultation and percussion, no chest deformities noted.  Extremities:  no cyanosis, clubbing, or edema.    [place] PHQ9 DEPRESSION QUESTIONNAIRE 03/21/2014 07/04/2014 07/25/2014 07/25/2014 11/29/2014   Interest 0 0 0 0 0   Depressed 0 0 0 0 2   Sleep -- -- -- -- 3   Energy -- -- -- -- 2   Appetite -- -- -- -- 1   Failure -- -- -- -- 0   Concentration -- -- -- -- 0   Movement -- -- -- -- 0   Suicide -- -- -- -- 0   Summary(Manual) -- -- -- -- --   Summary(Calculated) -- -- -- -- 8   Functional -- -- -- -- 2     GAD 7 11/29/2014   Feeling afraid as if something awful might happen 3   Being easily annoyed or irritable 2   Worrying too much about different things 2   If you checked off any problems, how difficult have these problems made it for you to do your job along with other people? Somewhat difficult   Feeling nervous, anxious or on edge 2      Trouble relaxing 2   Being so restless that it is hard to sit still 0   GAD7 Patient Total 13   Not being able to stop or control worrying 2       ASSESSMENT/PLAN:    Suzetta was seen today for other and collect specimen.    Diagnoses and all orders for this visit:    Oligomenorrhea  -     TSH, Blood Green Plasma Separator Tube; Future  -     Prolactin, Blood Green Plasma Separator Tube; Future  -     Testosterone (Female) Yellow serum separator tube; Future  -     DHEA Sulfate [place]; Future  -     HCG Quantitative, Blood Green Plasma Separator Tube; Future  -     Estradiol, Blood Green Plasma Separator Tube; Future  -     Luteinizing Hormone (LH), Blood Green Plasma Separator Tube; Future  -     Follicle Stimulating Hormone, Blood Green Plasma Separator Tube; Future  -  17-Hydroxyprogesterone, HPLC-MS/MS Yellow serum separator tube; Future  -     Lipid Panel Green Plasma Separator Tube; Future  -     Glycosylated Hgb(A1C), Blood Lavender; Future  -     Comprehensive Metabolic Panel Green Plasma Separator Tube; Future  -     US Pelvic Transabdominal Complete; Future  -     TSH, Blood Green Plasma Separator Tube  -     Prolactin, Blood Green Plasma Separator Tube  -     Testosterone (Female) Yellow serum separator tube  -     DHEA Sulfate [place]  -     HCG Quantitative, Blood Green Plasma Separator Tube  -     Estradiol, Blood Green Plasma Separator Tube  -     Luteinizing Hormone (LH), Blood Green Plasma Separator Tube  -     Follicle Stimulating Hormone, Blood Green Plasma Separator Tube  -     17-Hydroxyprogesterone, HPLC-MS/MS Yellow serum separator tube  -     Lipid Panel Green Plasma Separator Tube  -     Glycosylated Hgb(A1C), Blood Lavender  -     Comprehensive Metabolic Panel Green Plasma Separator Tube    Unable to lose weight    BMI 37.0-37.9, adult    Anxiety  -     citalopram (CELEXA) 20 MG tablet; Take 1 tablet (20 mg) by mouth daily. 1/2 tab po qhs x 4 nights, then one tab po qhs  -      [facility]

## 2014-12-02 LAB — TESTOSTERONE (FEMALE), BLOOD: Testosterone, Female: 18 ng/dL (ref 9–55)

## 2014-12-03 LAB — 17-HYDROXYPROGESTERONE, HPLC-MS/MS, BLOOD: 17-Hydroxyprogesterone: 19.1 ng/dL (ref <=206.00)

## 2014-12-19 ENCOUNTER — Telehealth (INDEPENDENT_AMBULATORY_CARE_PROVIDER_SITE_OTHER): Payer: Self-pay | Admitting: Family Medicine

## 2014-12-19 NOTE — Telephone Encounter (Signed)
RN called patient in which there was no answer at this time. Left a message, including clinic's phone number, for patient to call the clinic back.  Will follow up.    **Unable to send [employer].    **Anyone can schedule this patient an appt.  She needs to be seen for possible UTI as her last urine culture was from 11/2005.  Unable to Rx abx to patient since she hasn't had any urine tests since 2007 from a [facility] lab or any other lab, as there is nothing in the media where labs would be scanned into.

## 2014-12-19 NOTE — Telephone Encounter (Signed)
Verbally confirmed name of Primary Care Provider: Yes    What is patients symptom? Pt having chronic UTI symptoms; slightly burning, freq urination, low back pain. Pt stating the MD is aware that is this intermittent. Pt req to have rx sent to pharm w/o being seen. Informed her that she may need to be seen, prior to meds given. Pt still wanting to send a message.     Is this an ongoing or new symptom? Chronic    Estimated time since experiencing symptoms? This time; 5 days    Best Contact Number: [phone]  2ND best contact number: n/a    This message will be transmitted to our triage nurse.    Red Flags Keep patient on the line and call RN.

## 2014-12-19 NOTE — Telephone Encounter (Signed)
Patient returning RN Phone call.  Transferred to RN.

## 2014-12-19 NOTE — Telephone Encounter (Signed)
RN took phone call from patient at this time as she called back in.  Patient c/o possible UTI x5 days.  Pt states her symptoms include: urinary frequency, urinary urgency, burning sensation when urinating.  Pt stated that she called in just to get a Rx for abx.  RN informed her that her last urine culture was in 2007 and anytime when she thinks she has a UTI, she needs to give a urine sample to see if abx are truly needed.    Patient denies: itching, swelling, redness, vaginal discharge odor, rash, dyspnea, SOB, chest pain, fever, chills, hematuria, malodorous urine, cloudy urine, low back pain, flank pain, confusion, decreased LOC, AMS, **did not want to discuss if she recently had sexual intercourse.    Home Treatments: None.    Recommendations: wash hands, drink cranberry juice or cranberry tablets (at pt's preference), drink at least 8 glasses of water/day (no hx of CHF/water retn), Azo for symptoms, wipe front to back, urinate before, but especially after sexual intercourse.  Come in for an appt to be further assessed & treated as necessary.    Patient requesting to be seen in clinic. Patient was scheduled for tomorrow, 12/20/2014, at 1000, with Dr. Rogue Bussing.  Instructed patient to come in 15 mins prior to this appointment and to bring a photo ID as well as their insurance card.  Patient verbalized an agreement to and understanding of information above as well as the recommendations, and appointment date, time, and place.      Routed to Dr. Luvenia Heller & Dr. Rogue Bussing.

## 2014-12-20 ENCOUNTER — Encounter (INDEPENDENT_AMBULATORY_CARE_PROVIDER_SITE_OTHER): Payer: Self-pay | Admitting: Family Medicine

## 2014-12-20 ENCOUNTER — Other Ambulatory Visit: Payer: HMO | Attending: Family Medicine | Admitting: Family Medicine

## 2014-12-20 VITALS — BP 131/72 | HR 87 | Temp 98.6°F | Resp 16 | Ht 61.0 in | Wt 197.5 lb

## 2014-12-20 DIAGNOSIS — N390 Urinary tract infection, site not specified: Secondary | ICD-10-CM | POA: Insufficient documentation

## 2014-12-20 DIAGNOSIS — R300 Dysuria: Secondary | ICD-10-CM | POA: Insufficient documentation

## 2014-12-20 LAB — URINALYSIS
Bilirubin: NEGATIVE
Glucose: NEGATIVE
Ketones: NEGATIVE
Nitrite: NEGATIVE
Specific Gravity: 1.016 (ref 1.002–1.030)
Urobilinogen: NEGATIVE
WBC: >50 — AB (ref 0–?)
pH: 7 (ref 5.0–8.0)

## 2014-12-20 LAB — UA, CHEM ONLY POCT (BAYER)
Bilirubin: NEGATIVE
Glucose: NEGATIVE
Ketones: NEGATIVE
Nitrite: NEGATIVE
Specific Gravity: 1.015 (ref 1.002–1.030)
Urobilinogen: 0.2 EU/dl (ref 0.2–1.0)
pH: 6 (ref 5–8)

## 2014-12-20 MED ORDER — NITROFURANTOIN MONOHYD MACRO 100 MG OR CAPS
100.0000 mg | ORAL_CAPSULE | Freq: Two times a day (BID) | ORAL | 0 refills | Status: AC
Start: 2014-12-20 — End: 2014-12-25

## 2014-12-20 NOTE — Progress Notes (Signed)
[facility] Clinic Progress Note    SUBJECTIVE:  Meira Wahba is a 29 year old female who presents to clinic for: dysuria.    HPI:  Patient reports last Monday she started having blood in urine, frequency, and urgency. She started drinking cranberry juice and symptoms went away until Saturday when she started having dysuria, frequency and urgency. Also endorses lower abdominal pressure. Denies fevers, chills, n/v, flank pain, vaginal discharge, itching or cloudy urine. She used to get frequent UTIs, twice yearly but she hasn't had one in 2007. Currently sexually active with girlfriend. Has h/o HPV.     Declines flu shot today.    HISTORY:  Patient Active Problem List   Diagnosis    HYPERTRIGLYCERIDEMIA    ASCUS (atypical squamous cells of undetermined significance) on Pap smear    BMI 37.0-37.9, adult    Anxiety     Current Outpatient Prescriptions on File Prior to Visit   Medication Sig Dispense Refill    citalopram (CELEXA) 20 MG tablet Take 1 tablet (20 mg) by mouth daily. 1/2 tab po qhs x 4 nights, then one tab po qhs 30 tablet 0     No current facility-administered medications on file prior to visit.      Allergies   Allergen Reactions    Penicillins Hives     Family History   Problem Relation Age of Onset    Cancer Maternal Grandmother      GI cancer, also in 2 aunts    No Known Problems Mother     Cancer Father 75     lymphoma    Diabetes Neg Hx     Heart Disease Neg Hx     Stroke Neg Hx      Social History     Social History    Marital status: Single     Spouse name: N/A    Number of children: N/A    Years of education: N/A     Occupational History    receptionist      Social History Main Topics    Smoking status: Former Smoker    Smokeless tobacco: Never Used    Alcohol use 3.0 - 4.5 oz/week     2 - 3 Shots of liquor per week    Drug use: No    Sexual activity: Yes     Partners: Male     Birth control/ protection: Pill      Comment: Ortho-Evra patch     Other Topics Concern    Not on  file     Social History Narrative    Lives with mom, dad, grandparents, 30yo son    Cats, dogs, [place] pig, fish       OBJECTIVE:    BP 131/72  Pulse 87  Temp 98.6 F (37 C) (Oral)  Resp 16  Ht 5\' 1"  (1.549 m)  Wt 89.6 kg (197 lb 8 oz)  LMP 11/27/2014  SpO2 99%  BMI 37.32 kg/m2    Body mass index is 37.32 kg/(m^2).  General Appearance: healthy, alert, no distress, pleasant affect, cooperative.  Neck:  Neck supple. No adenopathy, thyroid symmetric, normal size.  Heart:  normal rate and regular rhythm, no murmurs, clicks, or gallops.  Lungs: clear to auscultation and percussion, no chest deformities noted.  Abdomen: Abdomen soft, non-tender. No masses or organomegaly. Bowel sounds normal. No rebound or guarding, no CVA tenderness.  Extremities:  no cyanosis, clubbing, or edema.  Skin:  negative.    ASSESMENT  AND PLAN:   Lelania Bia is a 29 year old female who presents to clinic for: dysuria.  Aunya was seen today for uti symptoms.    Diagnoses and all orders for this visit:    Urinary tract infection  -     nitrofurantoin-macrocrystalline (MACROBID) 100 MG capsule; Take 1 capsule (100 mg) by mouth 2 times daily for 5 days.  -     Urinalysis  -     Urine Culture Sterile Container  -     Discussed strict return and ED precautions    F/U: Return to clinic in 3-4 days if symptoms worsen or fail to improve.    Patient Instruction:   See Patient Education section.     Barriers to Learning assessed: none. Patient verbalizes understanding of teaching and instructions.      Medication Management:  Medications reviewed with patient and medication list reconciled.  Over the counter medications, herbal therapies and supplements reviewed.  Patient's understanding and response to medications assessed.    Barriers to medications assessed and addressed.  Risks, benefits, alternatives to medications reviewed.    Pt was discussed with attending Dr. Alda Ponder.     Ruthy Dick, MD  PGY-3

## 2014-12-22 LAB — URINE CULTURE: Urine Culture Result: <10000 — AB

## 2014-12-31 ENCOUNTER — Telehealth (INDEPENDENT_AMBULATORY_CARE_PROVIDER_SITE_OTHER): Payer: Self-pay | Admitting: Marriage & Family Therapist

## 2014-12-31 NOTE — Telephone Encounter (Signed)
I previously saw this pt in [place] June-Sept 2013. Pt was referred again to [facility] on 11/29/2014 by Dr. Luvenia Heller; called and left VM for pt today to follow up on referral.

## 2015-01-13 NOTE — Progress Notes (Signed)
Attending Note:                                                                   Subjective:  I discussed the case with the resident at the time of the clinical session.    I reviewed the entire history.  History of present illness (HPI): UTI Symptoms              Objective:  I reviewed the resident's examination findings.  Of note: BP 131/72  Pulse 87  Temp 98.6 F (37 C) (Oral)  Resp 16  Ht 5\' 1"  (1.549 m)  Wt 89.6 kg (197 lb 8 oz)  LMP 11/27/2014  SpO2 99%  BMI 37.32 kg/m2     Assessment and plan reviewed, discussed and finalized with the resident.   I agree with the resident's plan as documented.    See the resident physician's note for further details.

## 2015-01-17 NOTE — Telephone Encounter (Signed)
I did not hear back from this pt, I am now closing the CC referral.

## 2015-01-28 ENCOUNTER — Ambulatory Visit (HOSPITAL_BASED_OUTPATIENT_CLINIC_OR_DEPARTMENT_OTHER): Payer: HMO

## 2017-02-01 ENCOUNTER — Telehealth (INDEPENDENT_AMBULATORY_CARE_PROVIDER_SITE_OTHER): Payer: Self-pay | Admitting: Family Medicine

## 2017-02-01 NOTE — Telephone Encounter (Signed)
11:35    RN called per recorderpt. not available at this time. NO VM.

## 2017-02-01 NOTE — Telephone Encounter (Signed)
Symptom Call        Next office visit:    Date Time Provider [place]   02/02/2017 4:40 PM Trudi Ida, MD [facility]     Who is reporting the symptoms? Patient is calling    What symptom is the patient experiencing? Pt c/o of UTI, noticed blood in urine and pain during urination. Scheduled for tomorrow, but would like to speak with nurse for advice.    Name of PCP Provider:  Truddie Hidden   Insurance Coverage Verified: Verified- Active and in network  Last office visit: 12/20/2014    Is this a new or ongoing symptom? new  Estimated time since experiencing symptom(s)?     Best way to contact patient: [phone] (mobile)     Is the call received after 3 PM? No

## 2017-02-02 ENCOUNTER — Other Ambulatory Visit: Payer: HMO | Attending: Family Medicine | Admitting: Family Medicine

## 2017-02-02 ENCOUNTER — Encounter (INDEPENDENT_AMBULATORY_CARE_PROVIDER_SITE_OTHER): Payer: Self-pay | Admitting: Family Medicine

## 2017-02-02 VITALS — BP 118/82 | HR 74 | Temp 98.1°F | Resp 16 | Ht 61.0 in | Wt 194.0 lb

## 2017-02-02 DIAGNOSIS — Z1389 Encounter for screening for other disorder: Secondary | ICD-10-CM

## 2017-02-02 DIAGNOSIS — R300 Dysuria: Principal | ICD-10-CM | POA: Insufficient documentation

## 2017-02-02 LAB — UA, CHEM ONLY POCT (BAYER)
Bilirubin: NEGATIVE
Blood: 3
Glucose: NEGATIVE
Nitrite: POSITIVE
Protein: NEGATIVE
Specific Gravity: 1.03 (ref 1.002–1.030)
Urobilinogen: 0.2 (ref 0.2–1.0)
pH: 5.5 (ref 5–8)

## 2017-02-02 MED ORDER — NITROFURANTOIN MONOHYD MACRO 100 MG OR CAPS
100.0000 mg | ORAL_CAPSULE | Freq: Two times a day (BID) | ORAL | 0 refills | Status: AC
Start: 2017-02-02 — End: 2017-02-07

## 2017-02-02 NOTE — Progress Notes (Signed)
Family Medicine Clinic Note    CC:    Chief Complaint   Patient presents with    UTI Symptoms       SUBJECTIVE:    Ruth Hall is a 31 year old female who presents for above chief complaint    1) Dysuria x 1.5 wks, then was out of town  Sensation of needing to urinate, but nothing comes out, also burns to urinate  Tried cranberry pills  + frequency, no back pain  Normal PO intake, no fevers  - has had UTI a few years ago and feels similar     Review of Systems: as per HPI    Patient Active Problem List   Diagnosis    HYPERTRIGLYCERIDEMIA    ASCUS (atypical squamous cells of undetermined significance) on Pap smear    BMI 37.0-37.9, adult    Anxiety     Past Medical History:   Diagnosis Date    HPV in female        Current Outpatient Prescriptions on File Prior to Visit   Medication Sig Dispense Refill    citalopram (CELEXA) 20 MG tablet Take 1 tablet (20 mg) by mouth daily. 1/2 tab po qhs x 4 nights, then one tab po qhs 30 tablet 0     No current facility-administered medications on file prior to visit.      Allergies   Allergen Reactions    Penicillins Hives     OBJECTIVE:  BP 118/82 (BP Location: Left arm, BP Patient Position: Sitting, BP cuff size: Regular)   Pulse 74   Temp 98.1 F (36.7 C) (Oral)   Resp 16   Ht 5\' 1"  (1.549 m)   Wt 88 kg (194 lb)   LMP 02/02/2017 (Approximate)   SpO2 97%   BMI 36.66 kg/m2     GEN:  WDWN, NAD. Appears well.   HEENT: nc/at. MMM  NECK:  supple, no LAD, masses  CV: normal S1, S2, RRR, no m/r/g appreciated.    PULM: CTAB, no wheezes or crackles  ABD:  + BS, soft, slight suprapubic tenderness, ND. No masses, guarding or rebound. No CVA tenderness  EXT:  no edema, distal pulses normal and equal bilaterally. warm and well perfused.   SKIN:  no rashes or lesions on exposed skin appreciated      LABS:  Results for orders placed or performed in visit on 02/02/17   UA, Chem Only (POCT)   Result Value Ref Range    Color yellow     Appearance cloudy     Glucose neg Neg     Bilirubin neg Neg    Ketones trace Neg    Specific Gravity 1.030 1.002 - 1.030    Blood 3 Neg    pH 5.5 5 - 8    Protein neg Neg    Urobilinogen 0.2 0.2 - 1.0    Nitrite positive     Leuk Esterase trace Neg     ASSESSMENT & PLAN:  Ruth Hall is a 31 year old female was seen today for:  Ruth Hall was seen today for uti symptoms.    Diagnoses and all orders for this visit:    Dysuria  Likely UTI, UA positive for nitrite and LCE  - Rx for Macrobid  - Urine culture sent  -     UA, Chem Only (POCT)  -     Urine Culture Sterile Container; Future  -     nitrofurantoin monohydrate (MACROBID) 100 MG  capsule; Take 1 capsule (100 mg) by mouth 2 times daily for 5 days.    Screened negative for drug use      Health Maintenance reviewed - reminded to follow up for Pap.  Health Maintenance   Topic Date Due    INFLUENZA VACCINE  10/28/2016    CERVICAL CANCER SCREENING  03/21/2017    IMM_TD/TDAP=>10 YO  05/05/2020       Patient verbalized understanding of teaching and instructions, in agreement with plan of care.    Medications reviewed with patient and medication list reconciled.  Over the counter medications, herbal therapies and supplements reviewed.  Patient's understanding and response to medications assessed.    Barriers to medications assessed and addressed.  Risks, benefits, alternatives to medications reviewed.    F/U: 6 weeks- pap      Louellen Molder, MD  [place]

## 2017-02-02 NOTE — Patient Instructions (Signed)
Start antibiotic for UTI.   Come back in 6 weeks for Pap smear/annual exam

## 2017-02-02 NOTE — Telephone Encounter (Signed)
Unable to leave voicemail message nor mychart.  Future Appointments  Date Time Provider [place]   02/02/2017 4:40 PM Trudi Ida, MD [facility]

## 2017-02-03 NOTE — Telephone Encounter (Signed)
OV 11/6   Chief Complaint   Patient presents with    UTI Symptoms

## 2017-02-04 LAB — URINE CULTURE

## 2017-05-17 NOTE — Progress Notes (Deleted)
Ruth Hall is a 32yo G1P1001 w/hx obesity, prior HPV infection and Pcn allergy, who presents      Pap smear  Flu vaccine

## 2017-05-18 ENCOUNTER — Encounter (INDEPENDENT_AMBULATORY_CARE_PROVIDER_SITE_OTHER): Payer: HMO | Admitting: Family Medicine

## 2017-09-09 ENCOUNTER — Encounter (INDEPENDENT_AMBULATORY_CARE_PROVIDER_SITE_OTHER): Payer: Self-pay | Admitting: Internal Medicine

## 2017-09-09 DIAGNOSIS — Z0000 Encounter for general adult medical examination without abnormal findings: Principal | ICD-10-CM

## 2017-09-13 ENCOUNTER — Encounter (INDEPENDENT_AMBULATORY_CARE_PROVIDER_SITE_OTHER): Payer: HMO | Admitting: Family Medicine

## 2017-09-13 NOTE — Progress Notes (Deleted)
Ruth Hall is a 32yo G1P1001 w/hx GAD, obesity, PCN allergy,  and CIN-1 (2013) and HR HPV w/subsequent normal Pap (2015), who presents for

## 2017-09-14 ENCOUNTER — Encounter (INDEPENDENT_AMBULATORY_CARE_PROVIDER_SITE_OTHER): Payer: HMO | Admitting: Family Medicine

## 2017-09-22 NOTE — Progress Notes (Signed)
Ruth Hall is a 32 year old female G1P1001 w/hx HPV, former smoker, and depression, who presents for preventive health visit.    Painful cyst on anterior abdomen near CS scar - has had x 5 yrs, possibly growing, tender, uncomfortable.    Small 'ball' under skin near tip of xyphoid, maybe there for one year, seems to be growing.    Patient's last menstrual period was 08/28/2017.      History of abnormal pap smears?: yes  05/05/10 Pap:  ASCUS, HR HPV positive  06/13/10 Pap:  [facility]  3/176/12 colpo:  Benign, ECC benign  12/23/10 Pap:  LSIL  01/09/11:  Pap (repeat) LSIL  01/09/11 colpo:  Benign endocervix with chronic inflammation  07/06/11 Pap:  'SIL, grade cannot be determined.'  HPV positive.  Comment: Previous cytology reviewed. Many LSIL cells and some smaller cells are seen worrisome for higher grade lesion.   09/11/11 colpo:  CIN-1, ECC negative  03/21/14: [place]      Sexually active: yes  Pain w/sexual activity: no   Need for contraception? no    Social History     Tobacco Use   Smoking Status Former Smoker   Smokeless Tobacco Never Used       Vaccines: UTD    Diet / Exercise: fair    Psychosocial concerns:  FM [facility] 02/02/2017 11/29/2014   PHQ2 Total 0 2   Some recent data might be hidden       Family History   Problem Relation Name Age of Onset    Cancer Maternal Grandmother          GI cancer, also in 2 aunts    No Known Problems Mother      Cancer Father  75        lymphoma    Diabetes Neg Hx      Heart Disease Neg Hx      Stroke Neg Hx         Over age 75      I reviewed the patient's medical record, including PMH, SH, medications, allergies, and ROS.    Vitals:    09/23/17 0700   BP: 107/70   BP Location: Right arm   BP Patient Position: Sitting   BP cuff size: Regular   Pulse: 84   Resp: 16   Temp: 97 F (36.1 C)   TempSrc: Oral   Weight: 85.3 kg (188 lb)   Height: 5\' 1"  (1.549 m)     Physical Exam:   General Appearance: healthy, alert, no distress, pleasant affect, cooperative.  Skin: keloid near  umbilicus (see photo below)  Gyn:  Normal external female genitalia.  Speculum no discharge      Media Information      Document Information     Medical Photo - Image     ASSESSMENT/PLAN:    Ruth Hall was seen today for gyn exam.    Diagnoses and all orders for this visit:    Encounter for preventive health examination    Pap smear for cervical cancer screening  -     Cytopath Gyn (Pap Smear)    Inadequate exercise    Hypertrophic scar of skin  -     Consult/Referral to Derm Surgery - [place]    Lump  -     US Abdomen Limited; Future    Screening for diabetes mellitus  -     Glycosylated Hgb(A1C), Blood Lavender; Future    Screening for lipid disorders  -  Lipid Panel Green Plasma Separator Tube; Future    BMI 35.0-35.9,adult  -     Mychart Access Code Procedure  -     Comprehensive Metabolic Panel Green Plasma Separator Tube; Future

## 2017-09-23 ENCOUNTER — Ambulatory Visit (INDEPENDENT_AMBULATORY_CARE_PROVIDER_SITE_OTHER): Payer: HMO | Admitting: Family Medicine

## 2017-09-23 ENCOUNTER — Other Ambulatory Visit: Payer: HMO | Attending: Family Medicine

## 2017-09-23 ENCOUNTER — Encounter (INDEPENDENT_AMBULATORY_CARE_PROVIDER_SITE_OTHER): Payer: Self-pay | Admitting: Family Medicine

## 2017-09-23 VITALS — BP 107/70 | HR 84 | Temp 97.0°F | Resp 16 | Ht 61.0 in | Wt 188.0 lb

## 2017-09-23 DIAGNOSIS — Z6835 Body mass index (BMI) 35.0-35.9, adult: Principal | ICD-10-CM | POA: Insufficient documentation

## 2017-09-23 DIAGNOSIS — IMO0002 Reserved for concepts with insufficient information to code with codable children: Secondary | ICD-10-CM

## 2017-09-23 DIAGNOSIS — Z13220 Encounter for screening for lipoid disorders: Secondary | ICD-10-CM | POA: Insufficient documentation

## 2017-09-23 DIAGNOSIS — Z124 Encounter for screening for malignant neoplasm of cervix: Secondary | ICD-10-CM

## 2017-09-23 DIAGNOSIS — Z723 Lack of physical exercise: Secondary | ICD-10-CM | POA: Insufficient documentation

## 2017-09-23 DIAGNOSIS — Z131 Encounter for screening for diabetes mellitus: Secondary | ICD-10-CM

## 2017-09-23 DIAGNOSIS — L910 Hypertrophic scar: Secondary | ICD-10-CM

## 2017-09-23 DIAGNOSIS — Z0000 Encounter for general adult medical examination without abnormal findings: Principal | ICD-10-CM

## 2017-09-23 DIAGNOSIS — R229 Localized swelling, mass and lump, unspecified: Secondary | ICD-10-CM

## 2017-09-23 LAB — COMPREHENSIVE METABOLIC PANEL, BLOOD
ALT (SGPT): 18 U/L (ref 0–33)
AST (SGOT): 15 U/L (ref 0–32)
Albumin: 4.4 g/dL (ref 3.5–5.2)
Alkaline Phos: 70 U/L (ref 35–140)
Anion Gap: 10 mmol/L (ref 7–15)
BUN: 26 mg/dL — ABNORMAL HIGH (ref 6–20)
Bicarbonate: 24 mmol/L (ref 22–29)
Bilirubin, Tot: 0.21 mg/dL (ref <1.2)
Calcium: 9.5 mg/dL (ref 8.5–10.6)
Chloride: 105 mmol/L (ref 98–107)
Creatinine: 0.89 mg/dL (ref 0.51–0.95)
GFR: >60 mL/min
Glucose: 94 mg/dL (ref 70–99)
Potassium: 5.1 mmol/L (ref 3.5–5.1)
Sodium: 139 mmol/L (ref 136–145)
Total Protein: 8 g/dL (ref 6.0–8.0)

## 2017-09-23 LAB — LIPID(CHOL FRACT) PANEL, BLOOD
Cholesterol: 180 mg/dL (ref <200)
HDL-Cholesterol: 50 mg/dL
LDL-Chol (Calc): 79 mg/dL (ref <160)
Non-HDL Cholesterol: 130 mg/dL
Triglycerides: 253 mg/dL — ABNORMAL HIGH (ref 10–170)

## 2017-09-23 LAB — GLYCOSYLATED HGB(A1C), BLOOD: Glyco Hgb (A1C): 5.3 % (ref 4.8–5.8)

## 2017-09-23 NOTE — Interdisciplinary (Signed)
Blood drawn from right arm with 21 gauge needle. 2 tubes taken.   Patient identity authenticated by [name].

## 2017-10-01 LAB — HPV HIGH RISK DNA PROBE, FEMALE
HPV High Risk Genotype 16: NOT DETECTED
HPV High Risk Genotype 18: NOT DETECTED
HPV Other High Risk Genotypes (Not type 16 or 18): NOT DETECTED

## 2017-10-05 ENCOUNTER — Ambulatory Visit (HOSPITAL_BASED_OUTPATIENT_CLINIC_OR_DEPARTMENT_OTHER): Payer: HMO

## 2017-10-06 ENCOUNTER — Encounter (INDEPENDENT_AMBULATORY_CARE_PROVIDER_SITE_OTHER): Payer: Self-pay | Admitting: Family Medicine

## 2017-10-08 ENCOUNTER — Ambulatory Visit (HOSPITAL_BASED_OUTPATIENT_CLINIC_OR_DEPARTMENT_OTHER): Payer: HMO

## 2017-11-16 ENCOUNTER — Telehealth (INDEPENDENT_AMBULATORY_CARE_PROVIDER_SITE_OTHER): Payer: Self-pay | Admitting: Family Medicine

## 2017-11-16 NOTE — Telephone Encounter (Signed)
Symptom Call          Next office visit:  11/26/2017  List the date of PCP first available:   Acute:*        none           Return:*8/30  Did you offer Express Care/Urgent Care:  No    What symptom is the patient experiencing? Possible hemorrhoids, big bump on the rectal,  itching, feels it outside already. Started using preparation H and using Hazel (wipies) it got a little better and the itching stop but it got lower.     Scheduled on the 30th and wants to see Dr. Luvenia Heller only. Tried offering same day but she said she has to request the day off in advanced    Name of PCP Provider: Truddie Hidden   Insurance Coverage Verified: Active- in network  Last office visit: 09/23/2017    Who is reporting the symptoms? Incoming call from patient    Is this a new or ongoing symptom? new  Estimated time since experiencing symptom(s)? Thursday -4 days    Best way to contact patient:[phone]  Alternative communication method: [phone]        1.   2. *Cold transfer to triage nurse to extension #[id number]

## 2017-11-16 NOTE — Telephone Encounter (Signed)
MD ACTION REQUESTED: Pt wants to see Dr.Rosenblum only    RN ACTION: Appt given w/ PCP per pts request    Home management for now with Prep H.  High fiber, plenty of fluids, hot sitz and ice packs.  Avoid heavy lifting, anal sex.    Merge down applied: No  Decision Support tool used: Adult Telephone Protocols 4th Edition; [place]    Reason for call:  Pt noticed this outward pouch when she had kids.  CC:    Possible hemorrhiods  Onset:   When she had her kids then a couple of days ago it protruded    Associated symptoms: Itchy and painful    Does anything make it worse?  Bowel movement  Have you tried anything to relieve your symptoms?Prep H      DENIES: bleeding, trauma, fever, chills, rash, blurry vision, dizziness, impaired hearing, stiff neck, weakness  Denies: SOB, chest pain, palpitations, dyspnea, dysphagia, nausea or vomiting, diarrhea.  Denies: back/flank pain, dysuria, hematuria, hematemesis, hematochezia, or pain/pressure in lower abdomen or pelvic area.     ER Precautions reviewed: chest pain, sob, severe headache, numbness or weakness on one side, feeling faint/dizziness, confusion, slurred speech.      Patient verbalizes understanding and agrees with plan of care.  RN Encouraged patient to call back PRN or if symptoms worsen or persist.     Chicken General Risk Score 0    Pertinent PMHx:   Patient Active Problem List   Diagnosis   . ASCUS (atypical squamous cells of undetermined significance) on Pap smear   . BMI 37.0-37.9, adult   . Anxiety   . Inadequate exercise - not at goal       Future Appointments   Date Time Provider [place]   11/25/2017 10:20 AM Truddie Hidden, MD [facility]   01/04/2018  9:00 AM Durene Romans, MD [facility]       RN confirmed pt's pharmacy and allergies   [id number] - [place], [place]. AT [address]  [place].  [place] [address]  Phone: [phone] Fax: [phone]       Date: 11/16/2017   Time: 8:45 AM      Name of PCP Provider: Truddie Hidden     Direct call transfer to Triage RN  Patient name: Ruth Hall 32 year old   Verified DOB  Pt advised all calls are recorded for quality assurance.

## 2017-11-24 NOTE — Progress Notes (Deleted)
Ruth Hall is a 32 year old female G1P1001 w/hx HPV, former smoker, and depression, who presents for

## 2017-11-25 ENCOUNTER — Ambulatory Visit (INDEPENDENT_AMBULATORY_CARE_PROVIDER_SITE_OTHER): Payer: HMO | Admitting: Family Medicine

## 2017-11-25 ENCOUNTER — Encounter

## 2017-11-26 ENCOUNTER — Encounter (INDEPENDENT_AMBULATORY_CARE_PROVIDER_SITE_OTHER): Payer: HMO | Admitting: Family Medicine

## 2017-11-30 ENCOUNTER — Telehealth: Payer: Self-pay | Admitting: Hospital

## 2017-11-30 ENCOUNTER — Other Ambulatory Visit: Payer: HMO | Attending: Nurse Practitioner | Admitting: Nurse Practitioner

## 2017-11-30 ENCOUNTER — Encounter (INDEPENDENT_AMBULATORY_CARE_PROVIDER_SITE_OTHER): Payer: Self-pay | Admitting: Nurse Practitioner

## 2017-11-30 VITALS — BP 122/68 | HR 81 | Temp 98.8°F | Resp 18 | Ht 61.0 in | Wt 192.8 lb

## 2017-11-30 DIAGNOSIS — Z1331 Encounter for screening for depression: Secondary | ICD-10-CM

## 2017-11-30 DIAGNOSIS — N3001 Acute cystitis with hematuria: Principal | ICD-10-CM | POA: Insufficient documentation

## 2017-11-30 DIAGNOSIS — R030 Elevated blood-pressure reading, without diagnosis of hypertension: Secondary | ICD-10-CM

## 2017-11-30 DIAGNOSIS — Z1339 Encounter for screening examination for other mental health and behavioral disorders: Secondary | ICD-10-CM

## 2017-11-30 DIAGNOSIS — Z1389 Encounter for screening for other disorder: Secondary | ICD-10-CM

## 2017-11-30 LAB — UA, CHEM ONLY POCT
Bilirubin: NEGATIVE
Glucose: NEGATIVE
Ketones: NEGATIVE
Nitrite: POSITIVE
Specific Gravity: 1.02 (ref 1.002–1.030)
Urobilinogen: 0.2 (ref 0.2–1.0)
pH: 7 (ref 5–8)

## 2017-11-30 MED ORDER — PHENAZOPYRIDINE HCL 200 MG OR TABS
200.00 mg | ORAL_TABLET | Freq: Three times a day (TID) | ORAL | 0 refills | Status: AC
Start: 2017-11-30 — End: 2017-12-02

## 2017-11-30 MED ORDER — NITROFURANTOIN MONOHYD MACRO 100 MG OR CAPS
100.00 mg | ORAL_CAPSULE | Freq: Two times a day (BID) | ORAL | 0 refills | Status: AC
Start: 2017-11-30 — End: 2017-12-05

## 2017-11-30 NOTE — Patient Instructions (Addendum)
Urinary Tract Infection in Women: Care Instructions      Your Care Instructions    A urinary tract infection, or UTI, is a general term for an infection anywhere between the kidneys and the urethra (where urine comes out). Most UTIs are bladder infections. They often cause pain or burning when you urinate.    UTIs are caused by bacteria and can be cured with antibiotics. Be sure to complete your treatment so that the infection goes away.    Follow-up care is a key part of your treatment and safety. Be sure to make and go to all appointments, and call your doctor if you are having problems. It's also a good idea to know your test results and keep a list of the medicines you take.    How can you care for yourself at home?    . Take your antibiotics as directed. Do not stop taking them just because you feel better. You need to take the full course of antibiotics.  . Drink extra water and other fluids for the next day or two. This may help wash out the bacteria that are causing the infection. (If you have kidney, heart, or liver disease and have to limit fluids, talk with your doctor before you increase your fluid intake.)  . Avoid drinks that are carbonated or have caffeine. They can irritate the bladder.  . Urinate often. Try to empty your bladder each time.  . To relieve pain, take a hot bath or lay a heating pad set on low over your lower belly or genital area. Never go to sleep with a heating pad in place.    To prevent UTIs    . Drink plenty of water each day. This helps you urinate often, which clears bacteria from your system. (If you have kidney, heart, or liver disease and have to limit fluids, talk with your doctor before you increase your fluid intake.)  . Urinate when you need to.  . Urinate right after you have sex.  . Change sanitary pads often.  . Avoid douches, bubble baths, feminine hygiene sprays, and other feminine hygiene products that have deodorants.  . After going to the bathroom, wipe from front  to back.    When should you call for help?    Call your doctor now or seek immediate medical care if:    . Symptoms such as fever, chills, nausea, or vomiting get worse or appear for the first time.  . You have new pain in your back just below your rib cage. This is called flank pain.  . There is new blood or pus in your urine.  . You have any problems with your antibiotic medicine.    Watch closely for changes in your health, and be sure to contact your doctor if:    . You are not getting better after taking an antibiotic for 2 days.  . Your symptoms go away but then come back.    Care instructions adapted under license by [employer]. This care instruction is for use with your licensed healthcare professional. If you have questions about a medical condition or this instruction, always ask your healthcare professional. [employer] disclaims any warranty or liability for your use of this information.

## 2017-11-30 NOTE — Telephone Encounter (Signed)
Verified: Mobile Phone   MyChart Activation code sent to patient's: Mobile Phone

## 2017-11-30 NOTE — Interdisciplinary (Signed)
Urinalysis completed, results charted and given to MD for review    Urine pregnancy test performed, resulted neg, MD notified

## 2017-11-30 NOTE — Progress Notes (Signed)
Ruth Hall  MRN: [id number]  DOB: February 12, 1986  PMD: Truddie Hidden        Chief Complaint:   Chief Complaint   Patient presents with   . Urinary Frequency     pt states she has a UTI W/ buring for 2 days        HPI:  Ruth Hall is a 32 year old female  who presents with dysuria, frequency, urgency x 1 d, past hx UTI's with 1 in past year, no fever/vomiting/flank pain.    Took otc AZO today with some relief.    PCP Dr. Luvenia Heller utd on pap, tdap    Past Medical History:   Diagnosis Date   . HPV in female      Past Surgical History:   Procedure Laterality Date   . c-section  2005   . CHOLECYSTECTOMY, LAP  2006     Social History     Socioeconomic History   . Marital status: Single     Spouse name: Not on file   . Number of children: Not on file   . Years of education: Not on file   . Highest education level: Not on file   Occupational History   . Occupation: receptionist   Social Needs   . Financial resource strain: Not on file   . Food insecurity:     Worry: Not on file     Inability: Not on file   . Transportation needs:     Medical: Not on file     Non-medical: Not on file   Tobacco Use   . Smoking status: Former Research scientist (life sciences)   . Smokeless tobacco: Never Used   Substance and Sexual Activity   . Alcohol use: Yes     Alcohol/week: 3.0 - 4.5 oz     Types: 2 - 3 Shots of liquor per week   . Drug use: No   . Sexual activity: Yes     Partners: Male     Birth control/protection: Pill     Comment: Ortho-Evra patch   Lifestyle   . Physical activity:     Days per week: Not on file     Minutes per session: Not on file   . Stress: Not on file   Relationships   . Social connections:     Talks on phone: Not on file     Gets together: Not on file     Attends religious service: Not on file     Active member of club or organization: Not on file     Attends meetings of clubs or organizations: Not on file     Relationship status: Not on file   . Intimate partner violence:     Fear of current or ex partner: Not on  file     Emotionally abused: Not on file     Physically abused: Not on file     Forced sexual activity: Not on file   Other Topics Concern   . Not on file   Social History Narrative    Lives with mom, dad, grandparents, 71yo son    Cats, dogs, [place] pig, fish     Family History   Problem Relation Name Age of Onset   . Cancer Maternal Grandmother          GI cancer, also in 2 aunts   . No Known Problems Mother     . Cancer Father  56  lymphoma   . Diabetes Neg Hx     . Heart Disease Neg Hx     . Stroke Neg Hx       Current medications:  Current Outpatient Medications   Medication Sig   . nitrofurantoin monohydrate (MACROBID) 100 MG capsule Take 1 capsule (100 mg) by mouth 2 times daily for 5 days.   . phenazopyridine (PYRIDIUM) 200 MG tablet Take 1 tablet (200 mg) by mouth 3 times daily for 2 days.     No current facility-administered medications for this visit.        Review of Systems   Constitutional: Negative for fever.   HENT: Negative for congestion.    Eyes: Negative for redness.   Respiratory: Negative for cough.    Cardiovascular: Negative for chest pain.   Gastrointestinal: Negative for abdominal pain.   Endocrine: Negative for polyuria.   Genitourinary: Positive for dysuria, frequency and urgency.   Musculoskeletal: Negative for back pain.   Skin: Negative for rash.   Allergic/Immunologic: Negative for immunocompromised state.   Neurological: Negative for dizziness.   Hematological: Negative for adenopathy.   Psychiatric/Behavioral: Negative for agitation.        Physical Examination:    11/30/17  1621   BP: 122/68   Pulse: 81   Resp: 18   Temp: 98.8 F (37.1 C)   SpO2: 99%     General:  Well appearing, no distress  Eyes:  Anicteric. No conjunctival injection.  Mouth:  MMM.   Lungs clear.  No rales, rhonchi, or wheezing.   Cardiovascular:  RRR without murmur.  No peripheral edema, no cyanosis. Cap refill <2 sec  Abdominal:  ND, soft and non-tender.   Back:  No CVAT.    Extremities:  No swelling or  deformities.  Neuro:  A&Ox3.  Normal mentation.   Skin: Warm, dry.  Good turgor. No discoloration, lesions or rashes.        Diagnostics (if performed):    UA (+) heme/leuk/nitrite (but after AZO), ucg (-)    Assessment/Plan:     Will tx with abx, send cx      ICD-10-CM ICD-9-CM    1. Acute cystitis with hematuria N30.01 595.0 URINE PREGNANCY TEST POCT      UA, Chem Only (POCT)      Urine Culture - See Instructions      nitrofurantoin monohydrate (MACROBID) 100 MG capsule      phenazopyridine (PYRIDIUM) 200 MG tablet   2. Elevated blood pressure reading without diagnosis of hypertension R03.0 796.2    3. Screened negative for depression Z13.31 V79.0    4. Screened negative for alcohol use Z13.39 V79.1    5. Screened negative for drug use Z13.89 V82.9            See patient d/c instructions provided  Return/ED precautions reviewed with pt  Primary care f/u  Patient (or responsible party) verbalizes understanding of plan

## 2017-12-02 ENCOUNTER — Encounter (INDEPENDENT_AMBULATORY_CARE_PROVIDER_SITE_OTHER): Payer: Self-pay | Admitting: Nurse Practitioner

## 2017-12-02 LAB — URINE CULTURE

## 2017-12-02 NOTE — Progress Notes (Signed)
MyChart message sent to pt with results.

## 2018-01-04 ENCOUNTER — Telehealth (INDEPENDENT_AMBULATORY_CARE_PROVIDER_SITE_OTHER): Payer: Self-pay | Admitting: Micrographic Dermatologic Surgery

## 2018-01-04 ENCOUNTER — Ambulatory Visit (INDEPENDENT_AMBULATORY_CARE_PROVIDER_SITE_OTHER): Payer: HMO | Admitting: Micrographic Dermatologic Surgery

## 2018-01-04 NOTE — Telephone Encounter (Signed)
Spoke with patient. Pt states she only wants consultation at this time and not excision until she speak with surgical team. Routing to scheduling to assistance to reschedule patient.

## 2018-01-04 NOTE — Telephone Encounter (Signed)
Please call patient to discuss pre-op instructions. Patient is scheduled for Excision with Dr. Olevia Bowens on 01/06/18 at 9:00am.    Please advise. Ph: [phone]. Okay to leave a detailed message with pre-op instructions. Thank you    Dx. painful cyst anterior abdomen near C-section scar

## 2018-01-06 ENCOUNTER — Ambulatory Visit (INDEPENDENT_AMBULATORY_CARE_PROVIDER_SITE_OTHER): Payer: HMO | Admitting: Micrographic Dermatologic Surgery

## 2018-01-10 ENCOUNTER — Ambulatory Visit (HOSPITAL_BASED_OUTPATIENT_CLINIC_OR_DEPARTMENT_OTHER): Payer: HMO

## 2018-01-19 ENCOUNTER — Telehealth (INDEPENDENT_AMBULATORY_CARE_PROVIDER_SITE_OTHER): Payer: Self-pay | Admitting: Family Medicine

## 2018-01-19 ENCOUNTER — Other Ambulatory Visit: Payer: HMO | Attending: Nurse Practitioner | Admitting: Nurse Practitioner

## 2018-01-19 ENCOUNTER — Encounter (INDEPENDENT_AMBULATORY_CARE_PROVIDER_SITE_OTHER): Payer: Self-pay | Admitting: Nurse Practitioner

## 2018-01-19 VITALS — BP 124/84 | HR 84 | Temp 98.1°F | Resp 14 | Ht 61.0 in | Wt 192.0 lb

## 2018-01-19 DIAGNOSIS — N390 Urinary tract infection, site not specified: Principal | ICD-10-CM | POA: Insufficient documentation

## 2018-01-19 DIAGNOSIS — Z3200 Encounter for pregnancy test, result unknown: Secondary | ICD-10-CM

## 2018-01-19 DIAGNOSIS — B354 Tinea corporis: Secondary | ICD-10-CM

## 2018-01-19 DIAGNOSIS — R399 Unspecified symptoms and signs involving the genitourinary system: Secondary | ICD-10-CM | POA: Insufficient documentation

## 2018-01-19 LAB — UA, CHEM ONLY POCT
Blood: NEGATIVE
Nitrite: POSITIVE
Specific Gravity: 1.02 (ref 1.002–1.030)
Urobilinogen: 4 — AB (ref 0.2–1.0)
pH: 5 (ref 5–8)

## 2018-01-19 LAB — URINE PREGNANCY TEST POCT: HCG, Urine: NEGATIVE

## 2018-01-19 MED ORDER — NITROFURANTOIN MONOHYD MACRO 100 MG OR CAPS
100.00 mg | ORAL_CAPSULE | Freq: Two times a day (BID) | ORAL | 0 refills | Status: DC
Start: 2018-01-19 — End: 2018-01-24

## 2018-01-19 MED ORDER — KETOCONAZOLE 2 % EX CREA
1.00 | TOPICAL_CREAM | Freq: Two times a day (BID) | CUTANEOUS | 0 refills | Status: AC
Start: 2018-01-19 — End: 2018-02-18

## 2018-01-19 NOTE — Patient Instructions (Signed)
Urinary Tract Infection in Women: Care Instructions      Your Care Instructions    A urinary tract infection, or UTI, is a general term for an infection anywhere between the kidneys and the urethra (where urine comes out). Most UTIs are bladder infections. They often cause pain or burning when you urinate.    UTIs are caused by bacteria and can be cured with antibiotics. Be sure to complete your treatment so that the infection goes away.    Follow-up care is a key part of your treatment and safety. Be sure to make and go to all appointments, and call your doctor if you are having problems. It's also a good idea to know your test results and keep a list of the medicines you take.    How can you care for yourself at home?    . Take your antibiotics as directed. Do not stop taking them just because you feel better. You need to take the full course of antibiotics.  . Drink extra water and other fluids for the next day or two. This may help wash out the bacteria that are causing the infection. (If you have kidney, heart, or liver disease and have to limit fluids, talk with your doctor before you increase your fluid intake.)  . Avoid drinks that are carbonated or have caffeine. They can irritate the bladder.  . Urinate often. Try to empty your bladder each time.  . To relieve pain, take a hot bath or lay a heating pad set on low over your lower belly or genital area. Never go to sleep with a heating pad in place.    To prevent UTIs    . Drink plenty of water each day. This helps you urinate often, which clears bacteria from your system. (If you have kidney, heart, or liver disease and have to limit fluids, talk with your doctor before you increase your fluid intake.)  . Urinate when you need to.  . Urinate right after you have sex.  . Change sanitary pads often.  Marland Kitchen Avoid douches, bubble baths, feminine hygiene sprays, and other feminine hygiene products that have deodorants.  . After going to the bathroom, wipe from front  to back.    When should you call for help?    Call your doctor now or seek immediate medical care if:    Marland Kitchen Symptoms such as fever, chills, nausea, or vomiting get worse or appear for the first time.  . You have new pain in your back just below your rib cage. This is called flank pain.  . There is new blood or pus in your urine.  . You have any problems with your antibiotic medicine.    Watch closely for changes in your health, and be sure to contact your doctor if:    . You are not getting better after taking an antibiotic for 2 days.  . Your symptoms go away but then come back.    Care instructions adapted under license by [employer]. This care instruction is for use with your licensed healthcare professional. If you have questions about a medical condition or this instruction, always ask your healthcare professional. [employer] any warranty or liability for your use of this information.   Tinea, Ringworm    You have been diagnosed with tinea. This is also called "ringworm."    Ringworm is not actually a worm! It is a fungal infection. It is usually on the skin, feet, groin or scalp.  Symptoms include a rash that may be red, scaling and itchy. The rash may be circular or have very distinct borders with normal skin around it on all sides.    Ringworm is not dangerous. However, it is contagious (can be spread). Therefore, good hand washing is needed when touching the rash and when putting on medicated creams. Do not scratch or touch the area. Do not share towels with others, as this will spread the rash on the skin and can spread it to other family members. Wear clean clothes every day.    Treatment is with antifungal medicine. This is usually a cream put on several times daily. Oral (by mouth) pills may sometimes be prescribed if necessary to help clear up the infection.    Routine follow-up with your primary care doctor is recommended.    YOU SHOULD SEEK MEDICAL ATTENTION IMMEDIATELY,  EITHER HERE OR AT THE NEAREST EMERGENCY DEPARTMENT, IF ANY OF THE FOLLOWING OCCURS:   Your symptoms do not get better with treatment.   Any other new symptoms or concerns develop.   Any fevers (temperature higher than 100.89F / 38C) or pain at the rash site develop.

## 2018-01-19 NOTE — Telephone Encounter (Signed)
PROVIDER ACTION REQUESTED: NO/FYI only    RN ACTION: Time secured at [place] scheduled: YES   Your visit to [place] at 07:30 PM on Jan 19, 2018 has been confirmed.   Decision Support tool used: Adult Telephone Protocols 4th Edition; [place] [employer]    Reason for call: Pt states UTI symptoms started yesterday.  Burning on urination  Blood in urine  No fever, chills, body aches  No flank pain  Urgency and frequency    CC: UTI symptoms  Onset: yesterday  Associated symptoms: burning on urination, frequency, urgency    Does anything make it worse? no   Have you tried anything to relieve your symptoms? no  Do you feel this issue can wait for you to see your PCP or do you want to be seen sooner by any provider?  n/a    LMP: n/a    DENIES: fever, chills,  back/flank pain, pain/pressure in lower abdomen or pelvic area.     ER Precautions reviewed:Time secured at [facility]  Patient verbalizes understanding and agrees with plan of care.  RN Encouraged patient to call back PRN or if symptoms worsen or persist.     [place] [facility] Risk Score 0    Pertinent PMHx:   Patient Active Problem List   Diagnosis   . ASCUS (atypical squamous cells of undetermined significance) on Pap smear   . BMI 37.0-37.9, adult   . Anxiety   . Inadequate exercise - not at goal       No future appointments.  Last visit in this department 11/25/2017  Next visit in this department Visit date not found  RN confirmed pt's pharmacy and allergies   [id number] [place], [place]. [address]  [address]  [place] [address]  Phone: [phone] Fax: [phone]       Date: 01/19/2018   Time: 10:59 AM   Name of PCP Provider: Truddie Hidden     Direct call transfer to Triage RN  Patient name: Ruth Hall 32 year old   Verified DOB  Pt advised all calls are recorded for quality assurance.

## 2018-01-19 NOTE — Progress Notes (Signed)
Subjective:   Ruth Hall is a 32 year old adult who is here for UTI Symptoms (burning x 2 days ); Fever (fever and rash after camping 4 days ago ); and Cough      UTI Symptoms    This is a new problem. Episode onset: 2 days. The problem occurs every urination. The problem has been gradually worsening. The quality of the pain is described as burning. The pain is moderate. There has been no fever. She is sexually active. There is no history of pyelonephritis. Associated symptoms include frequency, hesitancy and urgency. Pertinent negatives include no chills, discharge, flank pain, hematuria, nausea, possible pregnancy, sweats or vomiting. She has tried NSAIDs (azo) for the symptoms. The treatment provided no relief.   URI    This is a new problem. Episode onset: 5 days ago. The problem has been resolved. Maximum temperature: subjective. The fever has been present for less than 1 day. Associated symptoms include congestion, coughing, rhinorrhea and sneezing. Pertinent negatives include no abdominal pain, chest pain, diarrhea, dysuria, ear pain, headaches, joint pain, joint swelling, nausea, neck pain, plugged ear sensation, rash, sinus pain, sore throat, swollen glands, vomiting or wheezing. Treatments tried: took abx grandmother gave her from [place].   Rash   This is a new problem. The current episode started in the past 7 days. The affected locations include the torso. The rash is characterized by itchiness and redness. Associated with: best friend has ringworm on her leg. Associated symptoms include congestion, coughing and rhinorrhea. Pertinent negatives include no diarrhea, fatigue, fever, joint pain, shortness of breath, sore throat or vomiting. Past treatments include nothing.           Review of Systems   Constitutional: Negative for chills, fatigue and fever.   HENT: Positive for congestion, rhinorrhea and sneezing. Negative for ear pain, sinus pain and sore throat.    Respiratory: Positive for  cough. Negative for shortness of breath and wheezing.    Cardiovascular: Negative for chest pain and palpitations.   Gastrointestinal: Negative for abdominal pain, diarrhea, nausea and vomiting.   Genitourinary: Positive for frequency, hesitancy and urgency. Negative for dysuria, flank pain and hematuria.   Musculoskeletal: Negative for joint pain and neck pain.   Skin: Negative for rash.   Neurological: Negative for headaches.        Current Outpatient Medications   Medication Sig Dispense Refill   . ketoconazole (NIZORAL) 2 % cream Apply 1 Application topically 2 times daily. Use a small amount as directed 30 g 0   . nitrofurantoin monohydrate (MACROBID) 100 MG capsule Take 1 capsule (100 mg) by mouth 2 times daily for 5 days. 10 capsule 0     No current facility-administered medications for this visit.      Allergies   Allergen Reactions   . Penicillins Hives       Reviewed patients pertinent information related to social history, past medical, past surgical, and family history.     Social History     Socioeconomic History   . Marital status: Single     Spouse name: Not on file   . Number of children: Not on file   . Years of education: Not on file   . Highest education level: Not on file   Occupational History   . Occupation: receptionist   Tobacco Use   . Smoking status: Former Research scientist (life sciences)   . Smokeless tobacco: Never Used   Substance and Sexual Activity   . Alcohol use: Yes  Alcohol/week: 5.0 - 7.5 standard drinks     Types: 2 - 3 Shots of liquor per week   . Drug use: No   . Sexual activity: Yes     Partners: Male     Birth control/protection: Pill     Comment: Ortho-Evra patch   Social Activities of Daily Living Present   . Not on file   Social History Narrative    Lives with mom, dad, grandparents, 27yo son    Cats, dogs, [place] pig, fish     Past Medical History:   Diagnosis Date   . HPV in female    . Migraine    . Obesity      Past Surgical History:   Procedure Laterality Date   . c-section  2005   .  CHOLECYSTECTOMY, LAP  2006         Objective:  Vital signs: BP 124/84 (BP Location: Left arm, BP Patient Position: Sitting, BP cuff size: Regular)   Pulse 84   Temp 98.1 F (36.7 C) (Oral)   Resp 14   Ht 5\' 1"  (1.549 m)   Wt 87.1 kg (192 lb)   LMP 01/05/2018   SpO2 98%   BMI 36.28 kg/m     Physical Exam  Vitals signs and nursing note reviewed.   Constitutional:       Appearance: Normal appearance.   HENT:      Head: Normocephalic and atraumatic.      Right Ear: Tympanic membrane, ear canal and external ear normal.      Left Ear: Tympanic membrane, ear canal and external ear normal.      Nose: Mucosal edema present. No congestion or rhinorrhea.      Right Turbinates: Swollen.      Left Turbinates: Swollen.      Mouth/Throat:      Mouth: Mucous membranes are moist.      Pharynx: No pharyngeal swelling, oropharyngeal exudate, posterior oropharyngeal erythema or uvula swelling.      Tonsils: No tonsillar exudate or tonsillar abscesses.   Eyes:      Pupils: Pupils are equal, round, and reactive to light.   Neck:      Musculoskeletal: Normal range of motion and neck supple.   Cardiovascular:      Rate and Rhythm: Normal rate and regular rhythm.      Pulses: Normal pulses.      Heart sounds: Normal heart sounds.   Pulmonary:      Effort: Pulmonary effort is normal.      Breath sounds: Normal breath sounds.   Skin:     General: Skin is warm and dry.      Capillary Refill: Capillary refill takes less than 2 seconds.   Neurological:      General: No focal deficit present.      Mental Status: She is alert and oriented to person, place, and time.         Assessment/Plan:  Fiona was seen today for uti symptoms, fever and cough.    Diagnoses and all orders for this visit:    UTI symptoms  -     UA, Chem Only (POCT)  -     URINE PREGNANCY TEST POCT    Acute UTI  -     nitrofurantoin monohydrate (MACROBID) 100 MG capsule; Take 1 capsule (100 mg) by mouth 2 times daily for 5 days.  -     Urine Culture - See Instructions;  Future  -  Urine Culture - See Instructions    Tinea corporis  -     ketoconazole (NIZORAL) 2 % cream; Apply 1 Application topically 2 times daily. Use a small amount as directed      Urine culture ordered.  Start Macrobid p.o..  Patient states her upper respiratory symptoms are resolving.  Advised patient to start using ketoconazole cream for ringworm.  Advised her that it is contagious.  And advised patient that if it does not improve in 3-4 weeks she should follow up with her primary care doctor or Dermatology.

## 2018-01-19 NOTE — Interdisciplinary (Signed)
705 Name/DOB verified.  Pt roomed, VS, and intake done.   Informed on plan of care and aware for any delay; pt verbalized understanding.   Ambulatory to clinic. Comfort measures offered. Vitals stable: Pain addressed.

## 2018-01-19 NOTE — Telephone Encounter (Signed)
Symptom Call          Next office visit:  None  List the date of PCP first available: Acute:*       01/27/2018           Return:*02/09/2018  Did you offer Express Care/Urgent Care:  Yes, pt declined to go to [facility]/express care    What symptom is the patient experiencing? Uti symptoms, burning and pain while urinating x2 days. No appts available in 24 hrs, pt declined urgent care/express care      Name of PCP Provider: Truddie Hidden   Insurance Coverage Verified: Active- in network  Last office visit: 11/25/2017    Who is reporting the symptoms? Incoming call from patient    Is this a new or ongoing symptom? new  Estimated time since experiencing symptom(s)? 2 days    Best way to contact patient: [phone] (mobile)   Alternative communication method: [phone] (mobile)     P CARE NAV TRIAGE POOL [ [id number] ]    *Cold transfer to triage nurse to extension #[phone]

## 2018-01-20 ENCOUNTER — Encounter (HOSPITAL_BASED_OUTPATIENT_CLINIC_OR_DEPARTMENT_OTHER): Payer: Self-pay

## 2018-01-20 NOTE — Interdisciplinary (Signed)
[employer]force order: [id number] @ 1959  1 urine culture   [facility].

## 2018-01-21 ENCOUNTER — Ambulatory Visit (INDEPENDENT_AMBULATORY_CARE_PROVIDER_SITE_OTHER): Payer: HMO | Admitting: Family Practice

## 2018-01-21 ENCOUNTER — Encounter (INDEPENDENT_AMBULATORY_CARE_PROVIDER_SITE_OTHER): Payer: Self-pay | Admitting: Family Practice

## 2018-01-21 ENCOUNTER — Telehealth (INDEPENDENT_AMBULATORY_CARE_PROVIDER_SITE_OTHER): Payer: Self-pay | Admitting: Family Medicine

## 2018-01-21 LAB — URINE CULTURE

## 2018-01-21 NOTE — Telephone Encounter (Signed)
PROVIDER ACTION REQUESTED: NO/FYI only    RN ACTION: Pt scheduled @ alternate clinic site d/t no appt availbility at office.   Appt scheduled: YES  Decision Support tool used: Adult Telephone Protocols 4th Edition; [place] Silos    Reason for call: symptoms  CC: UTI symptoms  Onset: Wednesday  Associated symptoms: pt went to [facility] on Wednesday, treated with Macorbid. Pt has been taking for 2 and a half days and states burning with urination is getting worse.     Does anything make it worse? no   Have you tried anything to relieve your symptoms? Taking antibiotics.  Do you feel this issue can wait for you to see your PCP or do you want to be seen sooner by any provider?  Wants to be seen    LMP: not asked    DENIES: fever, chills, nausea or vomiting, weakness, dizziness, diarrhea, back/flank pain, hematuria, or pain/pressure in lower abdomen or pelvic area.     ER Precautions reviewed: chest pain, sob, severe headache, numbness or weakness on one side, feeling faint/dizziness, confusion, slurred speech.   Patient verbalizes understanding and agrees with plan of care.  RN Encouraged patient to call back PRN or if symptoms worsen or persist.     [place] General Risk Score 0    Pertinent PMHx:   Patient Active Problem List   Diagnosis   . ASCUS (atypical squamous cells of undetermined significance) on Pap smear   . BMI 37.0-37.9, adult   . Anxiety   . Inadequate exercise - not at goal       Future Appointments   Date Time Provider [place]   01/24/2018  3:00 PM [facility] [place] [facility]   05/05/2018  3:00 PM Judee Clara Cathlyn Parsons., MD [facility]     Last visit in this department 11/25/2017  Next visit in this department Visit date not found  RN confirmed pt's pharmacy and allergies   [id number] [place], [place]. AT [address]  [place].  [place] [address]  Phone: [phone] Fax: [phone]       Date: 01/21/2018   Time: 11:47 AM   Name of PCP Provider:  Truddie Hidden     Direct call transfer to Triage RN  Patient name: Ruth Hall 32 year old   Verified DOB  Pt advised all calls are recorded for quality assurance.

## 2018-01-21 NOTE — Telephone Encounter (Addendum)
Patient called regarding symptom below.    Per patient request to speak with triage RN for advise/recommendation due to her concerns.    This agent cold transfer to triage RN line. Please advise.    Symptom Call      Name of PCP Provider: Truddie Hidden   Insurance Coverage Verified: active [employer] HMO  Last office visit: 11/30/2017  Next office visit:  Visit date not found  List the date of PCP first available:  Acute: none      Return: 11/20  Did you offer Express Care/Urgent Care: yes    Who is reporting the symptoms? patient  What symptom is the patient experiencing?    c/o UTI, burning sensation has increased.       Per patient was at [facility] on 10/23 for UTI. Did receive antibiotic macrobid. Per patient stated started  Wednesday  The medication but been still having the burning sensation and has increased since. Patient is concerned why.  Is this a new or ongoing symptom? On-going  Estimated time since experiencing symptom(s)? 3 days    Best way to contact patient: [phone]   Alternative communication method:

## 2018-01-21 NOTE — Progress Notes (Deleted)
No chief complaint on file.      Subjective:   Ruth Hall is a(n) 32 year old adult who presents for UTI symptoms.          ROS:  Eyes: {ROS Eyes:10514}.  Ears, Nose, Mouth, Throat: {ROS ENT:10519}.  CV: {ROS CV:10620}.  Resp: {ROS Resp:10621}.  GI: {ROS GI:10521}.  Musculoskeletal: {ROS Musc/skel:10522}.  Integumentary: {ROS Integumentary:10523}.    History:  Patient Active Problem List   Diagnosis   . ASCUS (atypical squamous cells of undetermined significance) on Pap smear   . BMI 37.0-37.9, adult   . Anxiety   . Inadequate exercise - not at goal     Past Medical History:   Diagnosis Date   . HPV in female    . Migraine    . Obesity      Past Surgical History:   Procedure Laterality Date   . CHOLECYSTECTOMY, LAP  2006   . c-section  2005     Social History     Socioeconomic History   . Marital status: Single     Spouse name: Not on file   . Number of children: Not on file   . Years of education: Not on file   . Highest education level: Not on file   Occupational History   . Occupation: receptionist   Social Needs   . Financial resource strain: Not on file   . Food insecurity:     Worry: Not on file     Inability: Not on file   . Transportation needs:     Medical: Not on file     Non-medical: Not on file   Tobacco Use   . Smoking status: Former Research scientist (life sciences)   . Smokeless tobacco: Never Used   Substance and Sexual Activity   . Alcohol use: Yes     Alcohol/week: 5.0 - 7.5 standard drinks     Types: 2 - 3 Shots of liquor per week   . Drug use: No   . Sexual activity: Yes     Partners: Male     Birth control/protection: Pill     Comment: Ortho-Evra patch   Lifestyle   . Physical activity:     Days per week: Not on file     Minutes per session: Not on file   . Stress: Not on file   Relationships   . Social connections:     Talks on phone: Not on file     Gets together: Not on file     Attends religious service: Not on file     Active member of club or organization: Not on file     Attends meetings of clubs or  organizations: Not on file     Relationship status: Not on file   . Intimate partner violence:     Fear of current or ex partner: Not on file     Emotionally abused: Not on file     Physically abused: Not on file     Forced sexual activity: Not on file   Other Topics Concern   . Not on file   Social History Narrative    Lives with mom, dad, grandparents, 15yo son    Cats, dogs, [place] pig, fish     Current Outpatient Medications   Medication Sig Dispense Refill   . ketoconazole (NIZORAL) 2 % cream Apply 1 Application topically 2 times daily. Use a small amount as directed 30 g 0   . nitrofurantoin monohydrate (MACROBID)  100 MG capsule Take 1 capsule (100 mg) by mouth 2 times daily for 5 days. 10 capsule 0     No current facility-administered medications for this visit.      Allergies   Allergen Reactions   . Penicillins Hives     Family History   Problem Relation Name Age of Onset   . Cancer Maternal Grandmother          GI cancer, also in 2 aunts   . No Known Problems Mother     . Cancer Father  49        lymphoma   . Diabetes Neg Hx     . Heart Disease Neg Hx     . Stroke Neg Hx     . Psychiatry Neg Hx     . Hypertension Neg Hx     . Thyroid Neg Hx     . No Known Cancer Neg Hx     . No Known Diabetes Neg Hx     . Other Neg Hx     . No Known Heart Disease Neg Hx       Objective:      Vitals       Physical Exam    Labs & Imaging    Urinalysis (01/19/18)  Color red   Final [facility]   Appearance clear   Final [place] [facility]   Glucose traceAbnormal   Neg Final [facility]   Bilirubin 1+Abnormal   Neg Final [facility]   Ketones 1+Abnormal   Neg Final [facility]   Specific Gravity 1.020  1.002 - 1.030 Final [facility]   Blood negative  Neg Final [facility]   pH 5.0  5 - 8 Final [facility]   Protein 2+Abnormal   Neg Final [facility]   Urobilinogen 4.0Abnormal   0.2 - 1.0 Final [facility]   Nitrite positive   Final [facility]   Leuk Esterase 3+Abnormal   Neg Final  LAB     Urine Cx (11/30/17  Urine Culture Abnormal  Staphylococcus saprophyticus     >100,000 CFU/mL   Routine testing of urine isolates of S.saprophyticus   is not advised, because infections respond to   concentrations achieved in urine of antimicrobial   agents commonly used to treat acute, uncomplicated   urinary tract infections (e.g. nitrofurantoin,   trimethoprim/sulfamethoxazole, or fluoroquinolone).          Assessment & Plan  Ruth Hall is a(n) 32 year old adult who presents for UTI symptoms.    #UTI Symptoms    Ruth Hall, MS3    Patient Instruction:   See Patient Education section.      {Assessment of barriers to learning and response to teaching:10548::"Barriers to Learning assessed: none. Patient verbalizes understanding of teaching and instructions."}    See follow up     Cc: Truddie Hidden

## 2018-01-21 NOTE — Telephone Encounter (Signed)
A user error has taken place: encounter opened in error, closed for administrative reasons.

## 2018-01-22 ENCOUNTER — Encounter (INDEPENDENT_AMBULATORY_CARE_PROVIDER_SITE_OTHER): Payer: Self-pay | Admitting: Nurse Practitioner

## 2018-01-22 ENCOUNTER — Other Ambulatory Visit: Payer: HMO | Attending: Nurse Practitioner | Admitting: Nurse Practitioner

## 2018-01-22 VITALS — BP 137/88 | HR 110 | Temp 98.1°F | Resp 18

## 2018-01-22 DIAGNOSIS — R399 Unspecified symptoms and signs involving the genitourinary system: Secondary | ICD-10-CM | POA: Insufficient documentation

## 2018-01-22 DIAGNOSIS — R300 Dysuria: Secondary | ICD-10-CM | POA: Insufficient documentation

## 2018-01-22 DIAGNOSIS — N760 Acute vaginitis: Principal | ICD-10-CM | POA: Insufficient documentation

## 2018-01-22 DIAGNOSIS — Z3200 Encounter for pregnancy test, result unknown: Secondary | ICD-10-CM

## 2018-01-22 LAB — VAGINOSIS SCREEN
Candida, Vaginal Screen: NEGATIVE
Gardnerella, Vaginal Screen: POSITIVE — AB
Trichomonas, Vaginal Screen: NEGATIVE

## 2018-01-22 LAB — UA, CHEM ONLY POCT
Bilirubin: NEGATIVE
Blood: NEGATIVE
Glucose: NEGATIVE
Nitrite: NEGATIVE
Specific Gravity: 1.02 (ref 1.002–1.030)
Urobilinogen: 1 (ref 0.2–1.0)
pH: 6 (ref 5–8)

## 2018-01-22 LAB — URINE PREGNANCY TEST POCT: HCG, Urine: NEGATIVE

## 2018-01-22 MED ORDER — PHENTERMINE HCL 37.5 MG OR CAPS
37.50 mg | ORAL_CAPSULE | Freq: Every morning | ORAL | Status: DC
Start: ? — End: 2020-08-20

## 2018-01-22 NOTE — Progress Notes (Signed)
Ruth Hall  MRN: [id number]  DOB: 1986-03-21  PMD: Truddie Hidden        Chief Complaint:   Chief Complaint   Patient presents with   . UTI Symptoms     sx not improving       HPI:  Ruth Hall is a 32 year old adult presents c/o dysuria x 1 week. Was seen here on 10/23 for dysuria x2 days with mild hematuria x1 day and started on Macrobid, pt had a positive UA however was taking pyridium at that time. Pt had also taken Macrobid on 9/3 for UTI and it resolved symptoms at that time.  Denies urinary urgency, frequency, hematuria at this time, abdominal pain, flank pain, f/c/n/v, vaginal discharge, vaginal pruritus.  Drinking fluids well. Pt endorses she was camping last weekend just prior to the start of symptoms. Endorses the burning has worsened since starting macrobid.     Sexual hx: monogamous partner     No h/o frequent UTI.  No h/o kidney stones.  LMP: 01/04/18        Past Medical History:   Diagnosis Date   . HPV in female    . Migraine    . Obesity      Past Surgical History:   Procedure Laterality Date   . CHOLECYSTECTOMY, LAP  2006   . c-section  2005     Social History     Socioeconomic History   . Marital status: Single     Spouse name: Not on file   . Number of children: Not on file   . Years of education: Not on file   . Highest education level: Not on file   Occupational History   . Occupation: receptionist   Social Needs   . Financial resource strain: Not on file   . Food insecurity:     Worry: Not on file     Inability: Not on file   . Transportation needs:     Medical: Not on file     Non-medical: Not on file   Tobacco Use   . Smoking status: Former Research scientist (life sciences)   . Smokeless tobacco: Never Used   Substance and Sexual Activity   . Alcohol use: Yes     Alcohol/week: 5.0 - 7.5 standard drinks     Types: 2 - 3 Shots of liquor per week   . Drug use: No   . Sexual activity: Yes     Partners: Male     Birth control/protection: Pill     Comment: Ortho-Evra patch   Lifestyle   . Physical  activity:     Days per week: Not on file     Minutes per session: Not on file   . Stress: Not on file   Relationships   . Social connections:     Talks on phone: Not on file     Gets together: Not on file     Attends religious service: Not on file     Active member of club or organization: Not on file     Attends meetings of clubs or organizations: Not on file     Relationship status: Not on file   . Intimate partner violence:     Fear of current or ex partner: Not on file     Emotionally abused: Not on file     Physically abused: Not on file     Forced sexual activity: Not on file   Other Topics Concern   .  Not on file   Social History Narrative    Lives with mom, dad, grandparents, 21yo son    Cats, dogs, [place] pig, fish     Family History   Problem Relation Name Age of Onset   . Cancer Maternal Grandmother          GI cancer, also in 2 aunts   . No Known Problems Mother     . Cancer Father  39        lymphoma   . Diabetes Neg Hx     . Heart Disease Neg Hx     . Stroke Neg Hx     . Psychiatry Neg Hx     . Hypertension Neg Hx     . Thyroid Neg Hx     . No Known Cancer Neg Hx     . No Known Diabetes Neg Hx     . Other Neg Hx     . No Known Heart Disease Neg Hx       Current medications:  Current Outpatient Medications   Medication Sig   . ketoconazole (NIZORAL) 2 % cream Apply 1 Application topically 2 times daily. Use a small amount as directed   . nitrofurantoin monohydrate (MACROBID) 100 MG capsule Take 1 capsule (100 mg) by mouth 2 times daily for 5 days.     No current facility-administered medications for this visit.        Review of Systems -  Constitutional: Negative for fever and chills  HENT: Negative for neck pain  Cardiovascular: Negative for chest pain/palpitations  Respiratory: Negative for cough/SOB  Gastrointestinal: Negative for n/v, abdominal pain and diarrhea  Genitourinary: +dysuria, negative for urgency, frequency, hematuria/flank pain, vaginal discharge, vaginal pruritus  Musculoskeletal:  Negative for back pain      Physical Examination:    01/22/18  1500   BP: 137/88   Pulse: 110   Resp: 18   Temp: 98.1 F (36.7 C)   SpO2: 99%     Constitutional:  Well appearing, no distress  Eyes: anicteric, no injection/discharge  ENT: no lesions, tongue midline  Lungs clear.  No rales, rhonchi, or wheezing   Cardiovascular:  RRR, S1/S2 normal  Abdominal:  Soft, NT/ND  GU:  No CVAT, mother as chaperone, shaved pubis, no lesions, mild amount of white discharge, os closed, no CMT/adnexal mass  Skin:  Warm, dry          Diagnostics (if performed):  Results for orders placed or performed in visit on 01/22/18   UA, Chem Only (POCT)   Result Value Ref Range    Color yellow     Appearance clea     Glucose neg Neg    Bilirubin neg Neg    Ketones 1+ (A) Neg    Specific Gravity 1.020 1.002 - 1.030    Blood neg Neg    pH 6.0 5 - 8    Protein trace (A) Neg    Urobilinogen 1.0 0.2 - 1.0    Nitrite neg     Leuk Esterase 1+ (A) Neg   URINE PREGNANCY TEST POCT   Result Value Ref Range    HCG, Urine neg     Preg Test, Control pass      Urine culture 10/23: mixed urogenital flora  Urine culture 9/3: >100k cfu staph saprophyticus        Impression/Plan:   32 year old adult presents c/o dysuria x 1 week which has worsened since starting Macrobid. UA/pelvic  exam unremarkable. Given that pt is not c/o urgency/frequency, it is likely vaginitis. Will await vaginosis results to treat as they will be available tomorrow. If vaginosis negative will call pt to discuss plan of care, if negative, would recommend starting Bactrim as previous cx was +staph saprophyticus. Afebrile, abdomen NT, no CVAT. Vaginitis vs. Cystitis vs. STI.    ICD-10-CM ICD-9-CM    1. Dysuria R30.0 788.1 Vaginosis Screen BD Affirm Collection System      Chlamydia/GC PCR, Genital [place]as PCR collection swab      Urine Culture - See Instructions   2. UTI symptoms R39.9 788.99 UA, Chem Only (POCT)      URINE PREGNANCY TEST POCT         See patient d/c instructions  provided  Return precautions   Primary care f/u       Liane Comber, FNP-BC

## 2018-01-22 NOTE — Patient Instructions (Signed)
Dysuria, Female    You have been seen for painful urination.    The medical term for painful urination is "Dysuria." There are several causes of painful urination. The most common is an infection in the bladder or kidneys.    Dysuria can also be caused by sexually-transmitted diseases like chlamydia or gonorrhea. Ovarian cysts, kidney stones, endometriosis and certain medicines are also causes. Cancer can be a cause, but this is rare. Dysuria can also happen because of local irritants. This could be douching, vaginal lubricants or scented feminine products or toilet paper.    We are not sure what has caused your case. It does not seem to be from anything dangerous. It is OK for you to go home today.    You should see your family doctor or gynecologist in a few days.    YOU SHOULD SEEK MEDICAL ATTENTION IMMEDIATELY, EITHER HERE OR AT THE NEARST EMERGENCY DEPARTMENT IF ANY OF THE FOLLOWING OCCUR:   You have fever (temperature higher than 100.4F / 38C).   You have abdominal or pelvic pain.   You are vomiting.   You have worsening pain.   If you cannot urinate.

## 2018-01-23 ENCOUNTER — Telehealth (INDEPENDENT_AMBULATORY_CARE_PROVIDER_SITE_OTHER): Payer: Self-pay | Admitting: Nurse Practitioner

## 2018-01-23 DIAGNOSIS — N760 Acute vaginitis: Secondary | ICD-10-CM

## 2018-01-23 MED ORDER — METRONIDAZOLE 500 MG OR TABS
500.0000 mg | ORAL_TABLET | Freq: Two times a day (BID) | ORAL | 0 refills | Status: AC
Start: 2018-01-23 — End: 2018-01-30

## 2018-01-23 NOTE — Telephone Encounter (Signed)
Spoke to pt. Advised +BV, opts for PO tx. ?s answered.    ICD-10-CM ICD-9-CM    1. Acute vaginitis N76.0 616.10 metroNIDAZOLE (FLAGYL) 500 MG tablet     Results for orders placed or performed in visit on 01/22/18   UA, Chem Only (POCT)   Result Value Ref Range    Color yellow     Appearance clea     Glucose neg Neg    Bilirubin neg Neg    Ketones 1+ (A) Neg    Specific Gravity 1.020 1.002 - 1.030    Blood neg Neg    pH 6.0 5 - 8    Protein trace (A) Neg    Urobilinogen 1.0 0.2 - 1.0    Nitrite neg     Leuk Esterase 1+ (A) Neg   URINE PREGNANCY TEST POCT   Result Value Ref Range    HCG, Urine neg     Preg Test, Control pass    Vaginosis Screen BD Affirm Collection System   Result Value Ref Range    Trichomonas, Vaginal Screen Negative See Comment    Gardnerella, Vaginal Screen POSITIVE (A) See Comment    Candida, Vaginal Screen Negative See Comment

## 2018-01-23 NOTE — Telephone Encounter (Signed)
PT returned your call, please call her back.

## 2018-01-24 ENCOUNTER — Telehealth (INDEPENDENT_AMBULATORY_CARE_PROVIDER_SITE_OTHER): Payer: Self-pay | Admitting: Nurse Practitioner

## 2018-01-24 ENCOUNTER — Ambulatory Visit
Admission: RE | Admit: 2018-01-24 | Discharge: 2018-01-24 | Disposition: A | Discharge status: Home / Self Care | Payer: HMO | Attending: Diagnostic Radiology | Admitting: Diagnostic Radiology

## 2018-01-24 ENCOUNTER — Telehealth (INDEPENDENT_AMBULATORY_CARE_PROVIDER_SITE_OTHER): Payer: Self-pay | Admitting: Family Medicine

## 2018-01-24 DIAGNOSIS — R222 Localized swelling, mass and lump, trunk: Secondary | ICD-10-CM | POA: Insufficient documentation

## 2018-01-24 DIAGNOSIS — N390 Urinary tract infection, site not specified: Principal | ICD-10-CM

## 2018-01-24 DIAGNOSIS — IMO0002 Reserved for concepts with insufficient information to code with codable children: Secondary | ICD-10-CM

## 2018-01-24 LAB — URINE CULTURE

## 2018-01-24 MED ORDER — NITROFURANTOIN MONOHYD MACRO 100 MG OR CAPS
100.0000 mg | ORAL_CAPSULE | Freq: Two times a day (BID) | ORAL | 0 refills | Status: AC
Start: 2018-01-24 — End: 2018-01-29

## 2018-01-24 NOTE — Telephone Encounter (Signed)
Pt advised GBS positive culture. To start macrobid. Has appt with GYN tomorrow as she now has bumps on her labia which are mildly itchy. To discuss recurrence of vaginitis/cystitis. Pt does endorses hypervigilance on vaginal cleanliness which is putting her at risk for these infections.    ICD-10-CM ICD-9-CM    1. Acute UTI N39.0 599.0 nitrofurantoin monohydrate (MACROBID) 100 MG capsule

## 2018-01-24 NOTE — Telephone Encounter (Signed)
PROVIDER ACTION REQUESTED: NO/FYI only    RN ACTION: Acute appt scheduled  Appt scheduled: YES 10/29 at 11:00  Decision Support tool used: Adult Telephone Protocols 4th Edition; [place] Silos    Reason for call:Pt states she has itching in vaginal area started Saturday night.  Now with some bumps inside.  No blood  Started metronidazole on Sat from [facility].  DX with bacterial vaginitis.  Vaginal discharge greenish/yellow  No odor  Burning on urination    CC: vaginal itching  Onset: ongoing  Associated symptoms: greenish/yellow vaginal discharge    Does anything make it worse? no  Have you tried anything to relieve your symptoms? no  Do you feel this issue can wait for you to see your PCP or do you want to be seen sooner by any provider?  Requesting provider appt    LMP: n/a    DENIES: fever, chills, rash, nausea or vomiting  ER Precautions reviewed: worsening symptoms  Patient verbalizes understanding and agrees with plan of care.  RN Encouraged patient to call back PRN or if symptoms worsen or persist.     [place] General Risk Score 0    Pertinent PMHx:   Patient Active Problem List   Diagnosis   . ASCUS (atypical squamous cells of undetermined significance) on Pap smear   . BMI 37.0-37.9, adult   . Anxiety   . Inadequate exercise - not at goal       Future Appointments   Date Time Provider [place]   01/24/2018  3:00 PM [facility] [place] [facility]   01/25/2018 11:00 AM Waymon Amato, MD [facility] [place]   05/05/2018  3:00 PM Darrel Hoover., MD [facility]     Last visit in this department 11/25/2017  Next visit in this department Visit date not found  RN confirmed pt's pharmacy and allergies   [id number] [place], [place]. AT [address]  [place].  [place] [address]  Phone: [phone] Fax: [phone]       Date: 01/24/2018   Time: 11:03 AM   Name of PCP Provider: Truddie Hidden , Dr. Alda Ponder    Direct call transfer to Triage RN  Patient name: Ruth Hall 32 year old   Verified DOB  Pt advised all calls are recorded for quality assurance.

## 2018-01-24 NOTE — Telephone Encounter (Addendum)
Symptom Call          Next office visit:  Visit date not found  List the date of PCP first available: Acute:*                   Return:*  Did you offer Express Care/Urgent Care: No    What symptom is the patient experiencing?     Itchiness and red bumps in the vaginal area x2 days  Pt requesting to speak with a nurse    Name of PCP Provider: Truddie Hidden   Insurance Coverage Verified: Active- in network  Last office visit: 11/25/2017    Who is reporting the symptoms? Incoming call from patient    Is this a new or ongoing symptom? ongoing  Estimated time since experiencing symptom(s)?     Best way to contact patient: [phone]  Alternative communication method: [phone]  1. *Cold transfer to triage nurse to extension #[phone]

## 2018-01-25 ENCOUNTER — Encounter (INDEPENDENT_AMBULATORY_CARE_PROVIDER_SITE_OTHER): Payer: Self-pay | Admitting: Family Medicine

## 2018-01-25 ENCOUNTER — Ambulatory Visit (INDEPENDENT_AMBULATORY_CARE_PROVIDER_SITE_OTHER): Payer: HMO | Admitting: Family Medicine

## 2018-01-25 ENCOUNTER — Telehealth (INDEPENDENT_AMBULATORY_CARE_PROVIDER_SITE_OTHER): Payer: Self-pay | Admitting: Family Medicine

## 2018-01-25 ENCOUNTER — Encounter (INDEPENDENT_AMBULATORY_CARE_PROVIDER_SITE_OTHER): Payer: Self-pay | Admitting: Nurse Practitioner

## 2018-01-25 ENCOUNTER — Other Ambulatory Visit: Payer: HMO | Attending: Family Medicine

## 2018-01-25 VITALS — BP 129/94 | HR 96 | Temp 98.0°F | Ht 60.98 in | Wt 186.0 lb

## 2018-01-25 DIAGNOSIS — R238 Other skin changes: Secondary | ICD-10-CM

## 2018-01-25 LAB — CHLAMYDIA/GONORRHEA PCR, GENITAL
Chlamydia trachomatis PCR: NOT DETECTED
Neisseria gonorrhoeae PCR: NOT DETECTED

## 2018-01-25 LAB — HIV 1/2 ANTIBODY & P24 ANTIGEN ASSAY, BLOOD: HIV 1/2 Antibody & P24 Antigen Assay: NONREACTIVE

## 2018-01-25 MED ORDER — ACYCLOVIR 800 MG OR TABS
800.00 mg | ORAL_TABLET | Freq: Three times a day (TID) | ORAL | 0 refills | Status: DC
Start: 2018-01-25 — End: 2018-01-25

## 2018-01-25 MED ORDER — ACYCLOVIR 400 MG OR TABS
400.00 mg | ORAL_TABLET | Freq: Three times a day (TID) | ORAL | 0 refills | Status: DC
Start: 2018-01-25 — End: 2018-04-01

## 2018-01-25 NOTE — Telephone Encounter (Signed)
Who is calling: Incoming call from patient  Insurance Coverage Verified: Active- in network  Reason for this call: Please reference msg below. Agent unable to reach nurse at time of call. Per patient requesting a call back ASAP tomorrow morning. Please assist.     Action required by office: Please expedite request and Please contact caller    Duplicate encounter? No previous documentation found on this issue.     Best way to contact:[phone]   Alternative: [phone]  Inquiry has been read verbatim to this caller. Verbalizes satisfaction and confirms the above is accurate: yes       Has been advised this message will be transmitted to office and can expect a response within the next 24-72 hours.

## 2018-01-25 NOTE — Telephone Encounter (Signed)
Who is calling: Incoming call from patient  Insurance Coverage Verified: Active- in network  Reason for this call: Patient calling to confirm dosage for   acyclovir (ZOVIRAX. Per patient there was two prescription sent to pharmacy.  Patient wants a call ASAP. CC agent attempted to get a hold of nurse, but not successful.    Action required by office: Please contact caller    Duplicate encounter? No previous documentation found on this issue.     Best way to contact: [phone]  Inquiry has been read verbatim to this caller. Verbalizes satisfaction and confirms the above is accurate: yes      Has been advised this message will be transmitted to office and can expect a response within the next 24-72 hours.

## 2018-01-25 NOTE — Telephone Encounter (Signed)
Triage/Float RN Note: Medication Rx     I have attempted to contact this patient by phone with the following results: answering machine, left message to return my call, I will continue to try later.    RN was able to send mychart message with correct dosage as well.     Jannifer Franklin, MSN, RN   [facility]  Float/ Triage Nurse,   Email: [email or web address]

## 2018-01-25 NOTE — Telephone Encounter (Signed)
Who is calling: Incoming call from patient    Reason for this call: Patient called stating that RN was supposed to give her a call.     Pt advised that Nurse tried calling her and also sent her a MyChart message when unable to speak to her. Pt checked MyChart and said there was no new message in her MyChart.      Action required by office: Please see above message and contact caller for a response.     Duplicate encounter? Please see closed encounter 01/25/18     Best way to contact: [phone] or [phone].      Inquiry has been read verbatim to this caller. Verbalizes satisfaction and confirms the above is accurate: yes      Has been advised this message will be transmitted to office and can expect a response within the next 24-72 hours.

## 2018-01-25 NOTE — Interdisciplinary (Signed)
Blood drawn from right arm with 21 gauge needle. 4 tubes taken.   Patient identity authenticated by [name].

## 2018-01-26 LAB — HERPES SIMPLEX VIRUS (HSV) 1/2 IGG
Herpes Simplex 1 IGG ab: POSITIVE — AB
Herpes Simplex 2 IGG Ab: NEGATIVE

## 2018-01-26 LAB — SYPHILIS EIA SCREEN, BLOOD: Syphilis Screen: NEGATIVE

## 2018-01-26 NOTE — Telephone Encounter (Signed)
01/26/2018 11:45 AM Phone (Outgoing) Ruth Hall, Ruth Hall [place] (Self) [phone] (M)   Rn contacted patient, 2 ID verifiers used  Pt reports she pickedup 2 prescriptions for Zovirax,   800 mg and 400 mg.  Instructed patient that she should only take the 400 mg and advised on how to take, read order from RX  Pt should not take 800 and she should dispose of it.  Verbalized understanding, agrees with plan of care    Md Advice:  FYI    Routing to Dr. Alvy Bimler. Doylene Bode BSN, RN - Float Nurse  Please reply to department triage

## 2018-01-26 NOTE — Telephone Encounter (Signed)
Call came from RN. pls advise Denice Paradise not in today routing to [place] team to see if they are able to assist with instructions. Pls refer to encounter 01/25/2018     Florene Route- MA

## 2018-01-27 LAB — HSV TYPE1/2 COMB IGM, BLOOD: HSV Type 1/2 Comb IGM: 2.77 IV — ABNORMAL HIGH (ref <=0.89)

## 2018-01-31 LAB — HERPES/VARICELLA CULTURE

## 2018-02-03 ENCOUNTER — Encounter (INDEPENDENT_AMBULATORY_CARE_PROVIDER_SITE_OTHER): Payer: Self-pay | Admitting: Family Medicine

## 2018-02-21 ENCOUNTER — Encounter (INDEPENDENT_AMBULATORY_CARE_PROVIDER_SITE_OTHER): Payer: Self-pay | Admitting: Family Medicine

## 2018-02-21 NOTE — Progress Notes (Signed)
FAMILY MEDICINE PROGRESS NOTE    CC:    Chief Complaint   Patient presents with   . Follow up Results       SUBJECTIVE:    Ruth Hall is a 33 year old adult who presents for urgent care follow-up.      HPI:   1) Pt reports she developed dysuria and was seen in [place] for UTI sxs on 10/23, was told she had a UTI, +chem, gave abx, then called to say to stop.  Was asked to come back in because she got worse, and was told she did not have a UTI, but then the culture grew and pt was called to start abx. In the meantime, she reports, her pain was "not really dysuria", it has been on her labia, and since starting antibitoics she has noticed some bumps.  She is concerned about possible STI.   No fevers/chills   +sex active with female, monogamous.   No prior STI in pt.       Review of Systems:   As per HPI and: - All others negative      Patient Active Problem List   Diagnosis   . ASCUS (atypical squamous cells of undetermined significance) on Pap smear   . BMI 37.0-37.9, adult   . Anxiety   . Inadequate exercise - not at goal       Outpatient Medications Prior to Visit   Medication Sig Dispense Refill   . ketoconazole (NIZORAL) 2 % cream Apply 1 Application topically 2 times daily. Use a small amount as directed 30 g 0   . metroNIDAZOLE (FLAGYL) 500 MG tablet Take 1 tablet (500 mg) by mouth 2 times daily for 7 days. 14 tablet 0   . nitrofurantoin monohydrate (MACROBID) 100 MG capsule Take 1 capsule (100 mg) by mouth 2 times daily for 5 days. 10 capsule 0   . phentermine 37.5 MG capsule Take 37.5 mg by mouth every morning.       No facility-administered medications prior to visit.            OBJECTIVE:  BP (!) 129/94 (BP Location: Left arm, BP Patient Position: Sitting, BP cuff size: Regular)   Pulse 96   Temp 98 F (36.7 C)   Ht 5' 0.98" (1.549 m)   Wt 84.4 kg (186 lb)   LMP 01/05/2018   SpO2 99%   BMI 35.16 kg/m     Physical Exam: General Appearance: healthy, alert, no distress, pleasant affect,  cooperative, sl pressured speech, appears mildly anxious appropriate to situation.  Pelvic:  Bartholin's glands, urethra, Skene's glands negative, vaginal mucosa normal, cervix clear, 4 scattered vesicular lesions on bilateral labia majora, mild erythema at base, HSV culture obtained.         ASSESSMENT & PLAN:  Ruth Hall is a 32 year old adult was seen today for:  Ruth Hall was seen today for follow up results.    Diagnoses and all orders for this visit:    Vesicles  -     Herpes Simplex 1/2 Antibody Panel, Blood Yellow serum separator tube; Future  -     Herpes/Varicella Culture Viral Transport Media Labia  -     HSV Type 1/2 Comb IgM, Blood Yellow serum separator tube; Future  -     Discontinue: acyclovir (ZOVIRAX) 800 MG tablet; Take 1 tablet (800 mg) by mouth 3 times daily.  -     acyclovir (ZOVIRAX) 400 MG tablet; Take 1 tablet (  400 mg) by mouth 3 times daily.  -     Syphilis Screen, Blood Yellow serum separator tube; Future  -     HIV 1/2 Antibody & P24 Antigen Assay Yellow serum separator tube; Future    Full STI panel  Discussed at length HSV< types, transmission, diagnosis, treatment - intermittent and prophylaxis, anticipatory guidance given. Pt visibly upset when leaving, all questions were answered.    Health Maintenance   Topic Date Due   . Varicella Vaccine (1 of 2 - 13+ 2-dose series) 07/22/1997   . DTaP,Tdap,and Td Vaccines (1 - Tdap) 04/22/2006   . HPV Vaccine <= 26 Yrs (2 - Female 3-dose series) 06/02/2010   . INFLUENZA VACCINE  10/28/2017   . PHQ2 depression screen  01/20/2019   . CERVICAL CANCER SCREENING  09/24/2022   . Zoster Vaccine (1 of 2) 07/23/2034   . Polio Vaccine  Aged Out   . Meningococcal MCV4 Vaccine  Aged Out         Art Levan R. Alda Ponder, MD    Plan discussed with pt including risks/benefits/alternatives including watchful waiting.  Informed pt of 24/7 on call MD.  ED if acutely worsening after hours.  Pt verbalized understanding.    Medications reviewed with patient and  medication list reconciled.  Over the counter medications, herbal therapies and supplements reviewed.  Patient's understanding and response to medications assessed.    Barriers to medications assessed and addressed.  Risks, benefits, alternatives to medications reviewed.

## 2018-03-17 ENCOUNTER — Ambulatory Visit (INDEPENDENT_AMBULATORY_CARE_PROVIDER_SITE_OTHER): Payer: HMO | Admitting: Student in an Organized Health Care Education/Training Program

## 2018-03-17 ENCOUNTER — Encounter (INDEPENDENT_AMBULATORY_CARE_PROVIDER_SITE_OTHER): Payer: Self-pay | Admitting: Student in an Organized Health Care Education/Training Program

## 2018-03-17 VITALS — BP 130/89 | HR 82 | Temp 98.3°F | Resp 18 | Ht 60.25 in | Wt 188.0 lb

## 2018-03-17 DIAGNOSIS — Z2821 Immunization not carried out because of patient refusal: Secondary | ICD-10-CM

## 2018-03-17 DIAGNOSIS — Z723 Lack of physical exercise: Secondary | ICD-10-CM

## 2018-03-17 DIAGNOSIS — Z23 Encounter for immunization: Secondary | ICD-10-CM

## 2018-03-17 DIAGNOSIS — B9689 Other specified bacterial agents as the cause of diseases classified elsewhere: Principal | ICD-10-CM

## 2018-03-17 DIAGNOSIS — N760 Acute vaginitis: Principal | ICD-10-CM

## 2018-03-17 NOTE — Patient Instructions (Signed)
See vaccination section of CDC website

## 2018-03-17 NOTE — Progress Notes (Signed)
Primary Care Clinic Note    CC:   Chief Complaint   Patient presents with   . Vaginal Problem       ID: Ruth Hall is a 32 year old adult with PMH below who presents for CC above.    Subjective:  Previous symptoms of BV from 01/22/2018 resolved with treatment. Also treated for genital herpes and UTI.  Had return of "abnormal discharge" with foul smell before period started on 12/13 but that has also resolved  Now no dysuria, no urinary frequency, no itching, no pain, no abnormal discharge, no rashes, no fevers  LMP 03/11/2018, still on it now  No new partners  Declining all vaccines because pt thinks they are harmful      Review of systems   See HPI, otherwise negative    Problems  Patient Active Problem List   Diagnosis   . ASCUS (atypical squamous cells of undetermined significance) on Pap smear   . BMI 37.0-37.9, adult   . Anxiety   . Inadequate exercise - not at goal       PMH  Past Medical History:   Diagnosis Date   . HPV in female    . Migraine    . Obesity        PSH  Past Surgical History:   Procedure Laterality Date   . CHOLECYSTECTOMY, LAP  2006   . c-section  2005       Medications    Current Outpatient Medications:   .  acyclovir (ZOVIRAX) 400 MG tablet, Take 1 tablet (400 mg) by mouth 3 times daily., Disp: 25 tablet, Rfl: 0  .  phentermine 37.5 MG capsule, Take 37.5 mg by mouth every morning., Disp: , Rfl:     Allergies  Allergies   Allergen Reactions   . Penicillins Hives       Family History  Family History   Problem Relation Name Age of Onset   . Cancer Maternal Grandmother          GI cancer, also in 2 aunts   . No Known Problems Mother     . Cancer Father  28        lymphoma   . Diabetes Neg Hx     . Heart Disease Neg Hx     . Stroke Neg Hx     . Psychiatry Neg Hx     . Hypertension Neg Hx     . Thyroid Neg Hx     . No Known Cancer Neg Hx     . No Known Diabetes Neg Hx     . Other Neg Hx     . No Known Heart Disease Neg Hx         Social History  Social History     Tobacco Use   .  Smoking status: Former Research scientist (life sciences)   . Smokeless tobacco: Never Used   Substance Use Topics   . Alcohol use: Yes     Alcohol/week: 5.0 - 7.5 standard drinks     Types: 2 - 3 Shots of liquor per week   . Drug use: No        Objective  BP 130/89 (BP Location: Left arm, BP Patient Position: Sitting, BP cuff size: Regular)   Pulse 82   Temp 98.3 F (36.8 C) (Oral)   Resp 18   Ht 5' 0.25" (1.53 m)   Wt 85.3 kg (188 lb)   LMP 03/11/2018   SpO2 100%  BMI 36.41 kg/m       Physical Exam:  Gen: NAD  Head:  Normocephalic, atraumatic    Eyes: EOMI, non-icteric, non-injected  Ears: normal external ears  Nose: patent  Neck:  supple  Pulm: CTAB, no w/r/r. breathing is comfortable on room air, speaking in full sentences  CV: RRR, no m/r/g  Ext: warm and well perfused, no edema  Neuro: Alert and oriented x4. CN2-12 grossly intact. Moving all extremities.   Skin: no rash    Labs:  Lab Results   Component Value Date    WBC 5.4 03/12/2006    RBC 4.83 03/12/2006    HGB 14.2 03/12/2006    HCT 42.2 03/12/2006    MCV 87.5 03/12/2006    MCHC 33.6 03/12/2006    RDW 12.5 03/12/2006    PLT 264 03/12/2006    MPV Positive 07/06/2011    MPV 9.4 03/12/2006     Lab Results   Component Value Date    NA 139 09/23/2017    K 5.1 09/23/2017    CL 105 09/23/2017    BICARB 24 09/23/2017    BUN 26 (H) 09/23/2017    CREAT 0.89 09/23/2017    GLU 94 09/23/2017    [place] 9.5 09/23/2017     Lab Results   Component Value Date    AST 15 09/23/2017    ALT 18 09/23/2017    ALK 70 09/23/2017    TP 8.0 09/23/2017    ALB 4.4 09/23/2017    TBILI 0.21 09/23/2017    DBILI 0.2 05/14/2006     Lab Results   Component Value Date    A1C 5.3 09/23/2017     Lab Results   Component Value Date    TSH 1.02 11/29/2014     Lab Results   Component Value Date    COLORUA yellow 01/22/2018    APPEARUA clea 01/22/2018    GLUCOSEUA neg 01/22/2018    BILIUA neg 01/22/2018    KETONEUA 1+ (A) 01/22/2018    SGUA 1.020 01/22/2018    BLOODUA neg 01/22/2018    PHUA 6.0 01/22/2018    PROTEINUA  trace (A) 01/22/2018    UROBILUA 1.0 01/22/2018    NITRITEUA neg 01/22/2018    LEUKESTUA 1+ (A) 01/22/2018    WBCUA >50 (A) 12/20/2014    RBCUA 6-10 (A) 12/20/2014    CRYSTALSUA AMORPHOUS 11/29/2003        11/26/2003 19:55 01/25/2018 11:45   HIV 1/2 Antibody & P24 Antigen Assay  Non Reactive   HSV Type 1/2 Comb IGM  2.77 (H)   RPR NonReactive    Herpes Simplex 2 IGG Ab  Negative   Herpes Simplex 1 IGG ab  POSITIVE (A)   Syphilis Screen  Negative      01/22/2018   Chlamydia PCR Not Detected   Gonococcus PCR Not Detected   Candida, Vaginal Screen Negative   Gardnerella, Vaginal Screen POSITIVE (A)   Trichomonas, Vaginal Screen Negative       Assessment and Plan:  Ruth Hall is a 32 year old adult who presents for Vaginal Problem     History of BV  Found on vaginosis panel 01/22/2018. Treated with metronidazole '500mg'$  BID x7 days. Now asymptomatic.     Need for vaccination  Vaccination refused by patient  Due for Tdap and Flu. Patient refuses all vaccines because she believes that they are harmful. Discussed the risks and benefits of vaccinations in general and more specifically the Flu and Tdap  vaccines. Gave patient some resources to read more about vaccines. She will read more about them and consider this.    Inadequate exercise - not at goal  PAVS Total Activity: 25 minutes/week minutes/week   Using a patient-centered motivational interviewing approach to shared decision making I have counseled ms. Debruyn on improving her level of exercise in the following way:     Physical activity was not addressed at this visit           Health Maintenance   Health Maintenance   Topic Date Due   . HPV Vaccine <= 26 Yrs (2 - Female 3-dose series) 04/17/2018 (Originally 06/02/2010)   . DTaP,Tdap,and Td Vaccines (1 - Tdap) 04/17/2018 (Originally 04/22/2006)   . PHQ2 depression screen  01/20/2019   . CERVICAL CANCER SCREENING  09/24/2022   . INFLUENZA VACCINE  Addressed   . Polio Vaccine  Aged Out   . Meningococcal MCV4 Vaccine   Aged Out   . Varicella Vaccine  Discontinued       Follow Up  Return if symptoms worsen or fail to improve.      Strict return precautions discussed  Patient verbalized understanding of plan and all questions answered.  Patient barriers to learning assessed: none  Patient verbalized understanding of teaching and instructions.     Patient was discussed with Dr. Francis Gaines MD [facility]  PGY3

## 2018-03-25 ENCOUNTER — Ambulatory Visit (INDEPENDENT_AMBULATORY_CARE_PROVIDER_SITE_OTHER): Payer: Self-pay | Admitting: Family Medicine

## 2018-03-25 NOTE — Telephone Encounter (Signed)
Pt returning call to triage RN

## 2018-03-25 NOTE — Telephone Encounter (Signed)
PROVIDER ACTION REQUESTED: NO, FYI Only     See PCP When Office is Open (Within 3 Days)    RN ACTION: Appt given w/PCP per pts request and pt requested pcp only  Has patient received 19/20 flu shot: no  Appt scheduled:   Yes  Future Appointments   Date Time Provider [place]   04/01/2018 10:00 AM Truddie Hidden, MD [facility]   05/05/2018  3:00 PM Darrel Hoover., MD [facility]       Reason for Disposition  . Abnormal color vaginal discharge (i.e., yellow, green, gray)    Additional Information  . Negative: [1] Vaginal bleeding AND [2] pregnant > 20 weeks  . Negative: [1] Vaginal bleeding AND [2] pregnant < 20 weeks  . Negative: [1] Having contractions or other symptoms of labor AND [2] < [redacted] weeks pregnant (i.e., preterm)  . Negative: [1] Having contractions or other symptoms of labor AND [2] > [redacted] weeks pregnant (i.e., term pregnancy)  . Negative: Leakage of fluid (trickle, gush) from the vagina  . Negative: Foreign body in vagina (e.g., tampon)  . Negative: Pain or burning with passing urine (urination) is main symptom  . Negative: [1] Pregnant 23 or more weeks AND [2] baby is moving less today (e.g., kick count < 5 in 1 hour or < 10 in 2 hours)  . Negative: Patient sounds very sick or weak to the triager  . Negative: [1] Constant abdominal pain AND [2] present > 2 hours  . Negative: [1] Intermittent lower abdominal pain AND [2] present > 24 hours  . Negative: [1] Pregnant 24-36 weeks (preterm) AND [2] pinkish or brownish mucous discharge  . Negative: [1] Yellow or green vaginal discharge AND [2] fever  . Negative: Painful rash with tiny water blisters  . Negative: [1] Rash (e.g., redness, tiny bumps, sore) of genital area AND [2] present > 24 hours  . Negative: Bad smelling vaginal discharge  . Negative: Tender lump (swelling or "ball") at vaginal opening  . Negative: [1] Symptoms of a "yeast infection" (i.e., itchy, white discharge, not bad smelling) AND [2] not improved > 3 days following  CARE ADVICE  . Negative: Patient is worried about sexually transmitted disease (STD)  . Negative: Pain with sexual intercourse (dyspareunia)  . Negative: [1] Pregnant > 36 weeks (term) AND [2] pinkish or brownish mucous discharge  . Negative: [1] Pregnant > 36 weeks (term) AND [2] passed a small glob or chunk of mucous (may look like gelatin or snot)  . Negative: [1] Rash (e.g., redness, tiny bumps, sore) of genital area AND [2] present < 24 hours  . Negative: Symptoms of a vaginal yeast infection (i.e., white, thick, cottage-cheese-like, itchy, not bad smelling discharge)  . Negative: Normal vaginal discharge in pregnancy    Answer Assessment - Initial Assessment Questions  1. DISCHARGE: "Describe the discharge." (e.g., white, yellow, green, gray, foamy, cottage cheese-like)      yellow  2. ODOR: "Is there a bad odor?"      Yes. Like cottage cheese  3. ONSET: "When did the discharge begin?"      2 days ago  4. RASH: "Is there a rash in that area?" If so, ask: "Describe it." (e.g., redness, blisters, sores, bumps)      no  5. ABDOMINAL PAIN: "Are you having any abdominal pain?" If yes: "What does it feel like?" (e.g., crampy, dull, intermittent, constant)       no    7. CAUSE: "  What do you think is causing the discharge?"      Bacterial vaginosis  8. OTHER SYMPTOMS: "Do you have any other symptoms?" (e.g., fever, itching, vaginal bleeding, pain with urination)      Streak of bleeding in discharge    Protocols used: PREGNANCY - [place]

## 2018-03-25 NOTE — Telephone Encounter (Signed)
RN placed call to patient   No answer  Message left for patient to return call in which to discuss concerns and to provide more details.  My chart message sent.     ANY TRIAGE NURSE CAN TAKE THE CALL.

## 2018-03-25 NOTE — Telephone Encounter (Signed)
-----   Message from Caffie Pinto sent at 03/24/2018  7:04 PM PST -----  Regarding: Appointment Request  Contact: [phone]  Appointment Request From: Luane School    With Provider: Paulene Floor, MD [[facility]    Preferred Date Range: Any    Preferred Times: Monday Morning, Friday Morning    Reason for visit: Office Visit    Comments:  Bleeding and discharge

## 2018-03-31 NOTE — Progress Notes (Signed)
Ruth Hall is a 33yo G1P1001 w/hx HPV, former smoker, and depression, who presents for vaginal discharge  No pain, yellow discharge, fishy odor when urinating.  Heterosexual, one partner, uses condoms.  No new sex toys. No new partners    Patient's last menstrual period was 03/09/2018 (approximate).    Vitals:    04/01/18 0956   BP: (!) 135/91   BP Location: Left arm   BP Patient Position: Sitting   BP cuff size: Regular   Pulse: 120   Temp: 98.5 F (36.9 C)   TempSrc: Oral   SpO2: 100%   Weight: 86.6 kg (191 lb)   Height: 5' 0.25" (1.53 m)     Physical Exam:   General Appearance: healthy, alert, no distress, pleasant affect, cooperative.  Gyn:  Normal external female genitalia.  Speculum no discharge, os closed, no lesions noted.      ASSESSMENT/PLAN:    Ruth Hall was seen today for vaginal problem.    Diagnoses and all orders for this visit:    Vaginal discharge  -     Vaginosis Screen BD Affirm Collection System  -     Chlamydia/GC PCR, Genital [place] PCR collection swab  -     Urine Culture Sterile Container; Future    Screened negative for depression    Abnormal urine odor  -     UA, Chem Only (POCT)  -     Urinalysis; Future    Elevated blood pressure reading without diagnosis of hypertension  Discussed side effects / risks of phentermine. Pt to discuss with dispensing provider.    Tachycardia

## 2018-04-01 ENCOUNTER — Other Ambulatory Visit: Payer: HMO | Attending: Family Medicine | Admitting: Family Medicine

## 2018-04-01 ENCOUNTER — Encounter (INDEPENDENT_AMBULATORY_CARE_PROVIDER_SITE_OTHER): Payer: Self-pay | Admitting: Family Medicine

## 2018-04-01 VITALS — BP 135/91 | HR 120 | Temp 98.5°F | Ht 60.25 in | Wt 191.0 lb

## 2018-04-01 DIAGNOSIS — R030 Elevated blood-pressure reading, without diagnosis of hypertension: Secondary | ICD-10-CM

## 2018-04-01 DIAGNOSIS — R000 Tachycardia, unspecified: Secondary | ICD-10-CM

## 2018-04-01 DIAGNOSIS — R8290 Unspecified abnormal findings in urine: Secondary | ICD-10-CM | POA: Insufficient documentation

## 2018-04-01 DIAGNOSIS — N898 Other specified noninflammatory disorders of vagina: Principal | ICD-10-CM | POA: Insufficient documentation

## 2018-04-01 DIAGNOSIS — Z1331 Encounter for screening for depression: Secondary | ICD-10-CM

## 2018-04-01 LAB — URINALYSIS
Bilirubin: NEGATIVE
Blood: NEGATIVE
Glucose: NEGATIVE
Ketones: NEGATIVE
Nitrite: NEGATIVE
Protein: NEGATIVE
Specific Gravity: 1.016 (ref 1.002–1.030)
Urobilinogen: NEGATIVE
pH: 7 (ref 5.0–8.0)

## 2018-04-01 LAB — UA, CHEM ONLY POCT
Bilirubin: NEGATIVE
Blood: NEGATIVE
Glucose: NEGATIVE
Ketones: NEGATIVE
Nitrite: NEGATIVE
Protein: NEGATIVE
Specific Gravity: 1.02 (ref 1.002–1.030)
Urobilinogen: 0.2 (ref 0.2–1.0)
pH: 7 (ref 5–8)

## 2018-04-01 NOTE — Progress Notes (Signed)
POCT urinalysis collected and ran by Edgar Frisk- MA    Florene Route- MA

## 2018-04-02 LAB — VAGINOSIS SCREEN
Candida, Vaginal Screen: NEGATIVE
Gardnerella, Vaginal Screen: NEGATIVE
Trichomonas, Vaginal Screen: NEGATIVE

## 2018-04-03 LAB — URINE CULTURE

## 2018-04-04 ENCOUNTER — Encounter (INDEPENDENT_AMBULATORY_CARE_PROVIDER_SITE_OTHER): Payer: Self-pay | Admitting: Family Medicine

## 2018-04-04 ENCOUNTER — Encounter (HOSPITAL_BASED_OUTPATIENT_CLINIC_OR_DEPARTMENT_OTHER): Payer: Self-pay | Admitting: Family Medicine

## 2018-04-04 DIAGNOSIS — N3000 Acute cystitis without hematuria: Principal | ICD-10-CM

## 2018-04-04 LAB — CHLAMYDIA/GONORRHEA PCR, GENITAL
Chlamydia trachomatis PCR: NOT DETECTED
Neisseria gonorrhoeae PCR: NOT DETECTED

## 2018-04-04 MED ORDER — NITROFURANTOIN MONOHYD MACRO 100 MG OR CAPS
100.0000 mg | ORAL_CAPSULE | Freq: Two times a day (BID) | ORAL | 0 refills | Status: DC
Start: 2018-04-04 — End: 2018-11-09

## 2018-04-29 ENCOUNTER — Encounter (INDEPENDENT_AMBULATORY_CARE_PROVIDER_SITE_OTHER): Payer: Self-pay | Admitting: Student in an Organized Health Care Education/Training Program

## 2018-05-05 ENCOUNTER — Ambulatory Visit (INDEPENDENT_AMBULATORY_CARE_PROVIDER_SITE_OTHER): Payer: HMO | Admitting: Student in an Organized Health Care Education/Training Program

## 2018-05-05 ENCOUNTER — Encounter (INDEPENDENT_AMBULATORY_CARE_PROVIDER_SITE_OTHER): Payer: Self-pay | Admitting: Student in an Organized Health Care Education/Training Program

## 2018-05-05 DIAGNOSIS — D485 Neoplasm of uncertain behavior of skin: Principal | ICD-10-CM

## 2018-05-05 NOTE — Progress Notes (Signed)
HISTORY:  Denika Krone is a 33 year old adult here for evaluation of growth on abdomen.  New patient.     Location: abdomen, immediately above C-section scar   Duration: 6-years  Symptoms: intermittent tenderness. Not tender today but sometimes hurts a lot after she goes hiking or drives a long time.  Feels better with icing or massage.   Current treatment:  none  Prior treatment: None  Aggravating/alleviating factors: Worse with exercise, friction   Other information: First stated as a small brown bump 6-7 years ago. She was told by a doctor that it was an ingrown hair.  Grew over the next 4-5 years but hasn't changed size in the past 2 years.  She hopes it is a cyst.     PAST DERM HISTORY:  Melanoma hx: negative   NMSC hx: negative     FAMILY HISTORY:  Melanoma hx: negative   NMSC hx: negative     PAST MEDICAL HISTORY:  The patient  has a past medical history of HPV in female, Migraine, and Obesity.    SOCIAL HISTORY:  - Enjoys hiking     MEDICATIONS: Reviewed     Allergies: Penicillins    REVIEW OF SYSTEMS:  CONSTITUTIONAL: Negative for fever or chills  DERMATOLOGIC: Feels well, no other skin complaints.    PHYSICAL EXAM:  Skin Type:  2  General Appearance: within normal limits  Neuro: Alert and oriented  Psych: Mood and affect within normal limits  Areas examined included: abdomen, lower legs, arms     All was within normal limits except:   - on the midline abdomen immediately superior to her C-section scar is a hyperpigmented 2 cm x 2 cm nodule   - small oval scar on right lower leg     ASSESSMENT AND TREATMENT PLAN:    Neoplasm of skin of uncertain behavior  Lesion on mildline lower abdomen concerning or DFSP, other malignancy, or granulomatous reaction to cyst.  Recommended punch biopsy today for diagnosis. Discussed that in some cases partial sample of lesion may not be able to provide definitive diagnosis, in which case larger excision would be required.  Patient would like to proceed with punch  biopsy but prefers to defer until after camping and hiking trip at [place] in 8 days.  Plans to schedule punch biopsy in March after returning from camping trip.      Return in about 6 weeks (around 06/16/2018) for punch biopsy .     Trisha Mangle, MD   Dermatology Resident

## 2018-05-05 NOTE — Patient Instructions (Addendum)
Please schedule punch biopsy March 26

## 2018-05-08 NOTE — Progress Notes (Signed)
[facility] Attending Note:     I reviewed the history. Patient interviewed and examined.    Review of Systems (ROS): As per the resident's note.   Past Medical, Family, Social History: As per the resident's note.   I have examined the patient and I concur with the resident's exam.   Assessment and Medical Decision Making reviewed with the resident physician.   I agree with the resident's description of history, exam, assessment and plan as documented     Assessment and plan reviewed with the resident.  See the resident's note for further details.     Callin Ashe, MD

## 2018-05-09 ENCOUNTER — Encounter (HOSPITAL_BASED_OUTPATIENT_CLINIC_OR_DEPARTMENT_OTHER): Payer: Self-pay

## 2018-06-09 ENCOUNTER — Encounter (INDEPENDENT_AMBULATORY_CARE_PROVIDER_SITE_OTHER): Payer: Self-pay | Admitting: Family Medicine

## 2018-06-09 ENCOUNTER — Other Ambulatory Visit: Payer: HMO | Attending: Family Medicine | Admitting: Family Medicine

## 2018-06-09 ENCOUNTER — Ambulatory Visit (INDEPENDENT_AMBULATORY_CARE_PROVIDER_SITE_OTHER): Payer: Self-pay | Admitting: Family Medicine

## 2018-06-09 VITALS — BP 115/69 | HR 80 | Temp 98.5°F | Ht 60.25 in | Wt 190.0 lb

## 2018-06-09 DIAGNOSIS — R05 Cough: Principal | ICD-10-CM | POA: Insufficient documentation

## 2018-06-09 DIAGNOSIS — J029 Acute pharyngitis, unspecified: Secondary | ICD-10-CM | POA: Insufficient documentation

## 2018-06-09 DIAGNOSIS — R0981 Nasal congestion: Secondary | ICD-10-CM | POA: Insufficient documentation

## 2018-06-09 DIAGNOSIS — R059 Cough, unspecified: Secondary | ICD-10-CM

## 2018-06-09 LAB — STREP TEST POCT: Rapid Group A Strep Screen: NEGATIVE

## 2018-06-09 LAB — UPPER RESPIRATORY PATHOGEN NUCLEIC ACID DETECTION TEST
(NON-COVID-19) Coronavirus PCR: NOT DETECTED
Adenovirus PCR: NOT DETECTED
Chlamydophila pneumoniae PCR: NOT DETECTED
Human Metapneumovirus PCR: NOT DETECTED
Human Rhinovirus/Enterovirus PCR: NOT DETECTED
Influenza A H1 PCR: NOT DETECTED
Influenza A H1-2009 PCR: NOT DETECTED
Influenza A H3 PCR: NOT DETECTED
Influenza A PCR: NOT DETECTED
Influenza B PCR: NOT DETECTED
Mycoplasma pneumoniae PCR: NOT DETECTED
Parainfluenza 1 PCR: NOT DETECTED
Parainfluenza 2 PCR: NOT DETECTED
Parainfluenza 3 PCR: NOT DETECTED
Parainfluenza 4 PCR: NOT DETECTED
Respiratory Syncytial Virus A PCR: NOT DETECTED
Respiratory Syncytial Virus B PCR: NOT DETECTED

## 2018-06-09 NOTE — Progress Notes (Signed)
Ruth Hall is a 33 year old G1P1001 w/hx HPV, former smoker, and depression, who presents fornote for work.    Runny nose, slight cough, worse at night when lays flat  No ear pain. Slight sore throat - irritation from coughing.  Feels good. Normal energy.  No fever. No recent travel.  Works at [employer], advised to stay away from work until she is cleared by a physician.  Non smoker.    I reviewed the patient's medical record, including PMH, SH, medications, allergies, and ROS.    Vitals:    06/09/18 1039   BP: 115/69   BP Location: Left arm   BP Patient Position: Sitting   BP cuff size: Regular   Pulse: 80   Temp: 98.5 F (36.9 C)   TempSrc: Oral   SpO2: 100%   Weight: 86.2 kg (190 lb)   Height: 5' 0.25" (1.53 m)     Physical Exam:   General Appearance: obese, alert, no distress, pleasant affect, cooperative.  Ears:  normal TMs and canal.  Nose:  normal and clear rhinorrhea.  Mouth: normal.  Neck:  Neck supple. No adenopathy, thyroid symmetric, normal size.  Heart:  normal rate and regular rhythm, no murmurs, clicks, or gallops.  Lungs: clear to auscultation and percussion, no chest deformities noted.    ASSESSMENT/PLAN:    Ruth Hall was seen today for cough and pharyngitis.    Diagnoses and all orders for this visit:    Cough  -     Upper Respiratory Pathogen Nucleic Acid Detection Test Viral Transport Media  -     Strep Test (POCT)    Nasal congestion  -     Upper Respiratory Pathogen Nucleic Acid Detection Test Viral Transport Media  -     Strep Test (POCT)    Sore throat  -     Upper Respiratory Pathogen Nucleic Acid Detection Test Viral Transport Media  -     Rapid Group A Strep Screen  -     Strep Test (POCT)  -     Strep Culture Group A [place] eSwab

## 2018-06-09 NOTE — Telephone Encounter (Signed)
PROVIDER ACTION REQUESTED: NO, FYI Only  RN ACTION: Acute appt scheduled.  Pt with sore throat/dry cough x 1 week. Pt denies SOB, fever. Denies body aches, cp, abd pain, NV, diarrhea, constipation, HA, earache. Taking nyquil. Pt works at assisted living for elderly. Pt speaking in complete sentences.    Use as part of cough, fever or shortness of breath triage:  Has the patient been in CLOSE contact (less than 6 feet) with a laboratory confirmed COVID-19 patient and is now reporting fever OR cough or difficulty breathing? no    Has the patient traveled internationally and is now reporting fever AND cough or difficulty breathing? no    Reason for Call: Sore Throat - Simple     Disposition: See PCP When Office is Open (Within 3 Days)       Has patient received 19/20 flu shot: no  Appt scheduled: yes  Future Appointments   Date Time Provider [place]   06/09/2018 10:20 AM Truddie Hidden, MD [facility]   06/23/2018  3:30 PM Darrel Hoover., MD [facility]     Reason for Disposition  . [1] Sore throat is the only symptom AND [2] present > 48 hours    Additional Information  . Negative: [1] Positive throat culture or rapid strep test (according to lab, PCP, caller, etc.) AND [2] NO  standing order to call in prescription for antibiotic  . Negative: [1] Adult is leaving on a trip AND [2] requests an antibiotic NOW  . Negative: SEVERE (e.g., excruciating) throat pain  . Negative: [1] Pus on tonsils (back of throat) AND [2]  fever AND [3] swollen neck lymph nodes ("glands")  . Negative: [1] Rash AND [2] widespread (especially chest and abdomen)  . Negative: Earache also present  . Negative: Fever present > 3 days (72 hours)  . Negative: Diabetes mellitus or weak immune system (e.g., HIV positive, cancer chemo, splenectomy, organ transplant, chronic steroids)  . Negative: History of rheumatic fever  . Negative: [1] Difficulty breathing AND [2] not severe  . Negative: Fever > 104 F (40 C)  . Negative:  [1] Refuses to drink anything AND [2] for > 12 hours  . Negative: [1] Drinking very little AND [2] dehydration suspected (e.g., no urine > 12 hours, very dry mouth, very lightheaded)  . Negative: Patient sounds very sick or weak to the triager  . Negative: [1] Drooling or spitting out saliva (because can't swallow) AND [2] normal breathing  . Negative: Unable to open mouth completely  . Negative: [1] Diagnosed strep throat AND [2] taking antibiotic AND [3] symptoms continue  . Negative: Throat culture results, call about  . Negative: Productive cough is main symptom  . Negative: Non-productive cough is main symptom  . Negative: Hoarseness is main symptom  . Negative: Runny nose is main symptom  . Negative: Severe difficulty breathing (e.g., struggling for each breath, speaks in single words, stridor)  . Negative: Sounds like a life-threatening emergency to the triager    Answer Assessment - Initial Assessment Questions  1. ONSET: "When did the throat start hurting?" (Hours or days ago)       1 week ago  2. SEVERITY: "How bad is the sore throat?" (Scale 1-10; mild, moderate or severe)    - MILD (1-3):  doesn't interfere with eating or normal activities    - MODERATE (4-7): interferes with eating some solids and normal activities    - SEVERE (8-10):  excruciating pain, interferes  with most normal activities    - SEVERE DYSPHAGIA: can't swallow liquids, drooling     3/4 out of time  3. STREP EXPOSURE: "Has there been any exposure to strep within the past week?" If so, ask: "What type of contact occurred?"       no  4.  VIRAL SYMPTOMS: "Are there any symptoms of a cold, such as a runny nose, cough, hoarse voice or red eyes?"       Runny nose, dry cough  5. FEVER: "Do you have a fever?" If so, ask: "What is your temperature, how was it measured, and when did it start?"      no  6. PUS ON THE TONSILS: "Is there pus on the tonsils in the back of your throat?"      No. Pt unsure  7. OTHER SYMPTOMS: "Do you have any other  symptoms?" (e.g., difficulty breathing, headache, rash)      no    Protocols used: SORE THROAT-A-AH

## 2018-06-09 NOTE — Telephone Encounter (Signed)
Symptom Call          Next office visit:  None    What symptom is the patient experiencing? Patient is calling stating she has a sore throat and cough.      Name of PCP Provider: Truddie Hidden   Insurance Coverage Verified: Active- in network  Last office visit: 04/01/2018    Who is reporting the symptoms? Incoming call from patient    Is this a new or ongoing symptom? new  Estimated time since experiencing symptom(s)? 1 week    Best way to contact patient: [phone] (mobile)   Alternative communication method: [phone] (mobile)   @HOMEPHONE @  @WORKPHONE @            *Select a method of escalation for this call:     1. *Warm transfer to [place] RN at extension #[phone]  a.

## 2018-06-10 ENCOUNTER — Telehealth (INDEPENDENT_AMBULATORY_CARE_PROVIDER_SITE_OTHER): Payer: Self-pay | Admitting: Family Medicine

## 2018-06-10 ENCOUNTER — Encounter (INDEPENDENT_AMBULATORY_CARE_PROVIDER_SITE_OTHER): Payer: Self-pay | Admitting: Family Medicine

## 2018-06-10 ENCOUNTER — Ambulatory Visit (INDEPENDENT_AMBULATORY_CARE_PROVIDER_SITE_OTHER): Payer: Self-pay | Admitting: Family Medicine

## 2018-06-10 DIAGNOSIS — R059 Cough, unspecified: Secondary | ICD-10-CM

## 2018-06-10 NOTE — Telephone Encounter (Addendum)
Spoke to Triage [phone].  Pt works at [employer].  Sick x 10d with dry cough and nasal congestion.  Strep Test negative  Respiratory pathogen panel negative.  Recommend Covid-19 testing based upon patient's work place [employer]).  They will call patient and triage for drive-through testing.    Left message on patient's voicemail, explaining above.    Truddie Hidden, MD  Signature Derived From Controlled Access Password, June 10, 2018, 12:45 PM    ADDENDUM:  Spoke to patient by phone at 4:15pm.  She had not been able to get through to Triage, and did not understand she was being referred for Covid-19 testing.  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    Truddie Hidden, MD  Signature Derived From Controlled Access Password, June 10, 2018, 5:20 PM

## 2018-06-10 NOTE — Telephone Encounter (Signed)
Triage RN had attempted to contact this patient by phone with the following results: answering machine.   Left the following message:  --call back to discuss symptoms and concerns with a triage RN between [phone] and [phone].   --Triage phone number included.     Mychart sent  Triage RNs to continue to follow        Please transfer to [facility], RN when pt calls back to schedule covid drive through

## 2018-06-10 NOTE — Telephone Encounter (Signed)
Pt returned call to triage and disconnected while on hold

## 2018-06-10 NOTE — Telephone Encounter (Signed)
PCP NOTES (COPY AND PASTE):    Spoke to Triage [phone].  Pt works at [employer].  Sick x 10d with dry cough and nasal congestion.  Strep Test negative  Respiratory pathogen panel negative.  Recommend Covid-19 testing based upon patient's work place [employer]).  They will call patient and triage for drive-through testing.    Truddie Hidden, MD  Signature Derived From Controlled Access Password, June 10, 2018, 12:45 PM

## 2018-06-11 ENCOUNTER — Telehealth (INDEPENDENT_AMBULATORY_CARE_PROVIDER_SITE_OTHER): Payer: Self-pay | Admitting: Family Medicine

## 2018-06-11 ENCOUNTER — Other Ambulatory Visit: Payer: HMO | Attending: Family Medicine

## 2018-06-11 DIAGNOSIS — Z1159 Encounter for screening for other viral diseases: Secondary | ICD-10-CM | POA: Insufficient documentation

## 2018-06-11 DIAGNOSIS — R05 Cough: Secondary | ICD-10-CM | POA: Insufficient documentation

## 2018-06-11 LAB — GROUP A STREP CULTURE: Strep Group A Culture Result: NO GROWTH

## 2018-06-11 NOTE — Telephone Encounter (Signed)
Addressed in another encounter    Future Appointments   Date Time Provider [place]   06/11/2018 11:00 AM [facility] COVID-19 TESTING [facility] [address]   06/23/2018  3:30 PM Haddock, Cathlyn Parsons., MD [facility]

## 2018-06-11 NOTE — Interdisciplinary (Signed)
Obtained oropharyngeal and nasopharyngeal specimens from patient and submitted to lab. Patient given self care Isolation Instructions to follow while waiting for test results.

## 2018-06-11 NOTE — Telephone Encounter (Signed)
Pt returned call and Dr. Luvenia Heller has already ordered testing.   Pt calling to secure drive up testing appt   Pt w/ unchanged symptoms and reports only dry cough and nasal congestion x 10 days.  Denies fever or sob    Pt sched for drive thru today and given contact # to call, stay in car and await testing personal.   06/11/2018 11:00 AM [facility] [place] COVID-19 TESTING [facility] [address]     Use as part of cough, fever or shortness of breath triage:  1. Do you have a fever AND a new cough or shortness of breath (SOB) AND at high risk for complications due to FMBWG-66*  no  . High risk includes age>65, smoker, chronic lung disease, chronic heart disease, chronic liver disease, chronic kidney disease, immunosuppression, no  2. Do you have a fever AND new cough or shortness of breath (SOB) AND work in a healthcare environment? no  3. Do you have a fever AND new cough or SOB AND have one of the following epidemiological risks within the last 14 days: no  []  Travelled via air, bus, or train for more than 2 hours?   []  Attended a national or international meeting/event/gathering at a large (>100) indoor venue?         []  Travel to a high risk area (i.e. [place] or [employer])?        []   Travel to a affected countries including [place] (not [place], [place] or [place]), [place], [place], [place], [place]?                    []  Close contact with another person (i.e. family members) who has any of the risks above?  4. Has the patient been in CLOSE contact (less than 6 feet) with a laboratory confirmed COVID-19 patient AND is now reporting fever OR cough or difficulty breathing? no       Schedule drive up screening (For Primary care triage nurse only, for specialty areas, refer patient to primary care provider)  . Indicated for patients with low risk of clinical deterioration and do not have any high risk factors for severe illness.  . Schedule [facility] appointment 3119  . Pend COVID order panel (for Nasopharyngeal/Oropharyngeal assays)  and route to provider for approval  . Instruct patient to page urgent care staff when they arrive

## 2018-06-11 NOTE — Telephone Encounter (Signed)
I have attempted to contact this patient by phone with the following results: answering machine, left message to return ANY RN's call, we will continue to try later.    Mychart message also sent to pt.        PCP NOTES (COPY AND PASTE):    Spoke to Triage [phone].  Pt works at [employer].  Sick x 10d with dry cough and nasal congestion.  Strep Test negative  Respiratory pathogen panel negative.  Recommend Covid-19 testing based upon patient's work place [employer]).  They will call patient and triage for drive-through testing.    Truddie Hidden, MD  Signature Derived From Controlled Access Password,June 10, 2018, 12:45 PM

## 2018-06-11 NOTE — Telephone Encounter (Addendum)
I have opened another telephone encounter this AM and have reached out to this AM.  Left her a message to please call us back ASAP to triage and schedule her for testing.  Pt was also sent a [facility] message.     Thank you  Maud Deed, RN

## 2018-06-11 NOTE — Progress Notes (Signed)
Await test results

## 2018-06-13 ENCOUNTER — Telehealth (INDEPENDENT_AMBULATORY_CARE_PROVIDER_SITE_OTHER): Payer: Self-pay | Admitting: Family Medicine

## 2018-06-13 NOTE — Telephone Encounter (Signed)
Patient returning call back.    Who is calling: patient  Insurance Coverage Verified: active [place]    Reason for this call:    Per patient stated had a miss call on phone and no message on voicemail. Per epic no annotation of call out.     Per patient did do the Covid-19 testing over the weekend. Per epic lab test is "In Process". Per patient stated eager to get back to work when results finalized.     This agent did speak with LVN, Evelia and per LVN no call out from her or from [facility] office. Per LVN stated will page Dr. Luvenia Heller if she tried to reach patient. Inform patient of LVN message.  Action required by office:    Per patient request call back if PCP tried to reach her. Please advise.     Best way to contact: [phone]  Alternative: none  Inquiry has been read verbatim to this caller. Verbalizes satisfaction and confirms the above is accurate: yes  Has been advised this message will be transmitted to office and can expect a response within the next 24-72 hours.

## 2018-06-15 ENCOUNTER — Telehealth (INDEPENDENT_AMBULATORY_CARE_PROVIDER_SITE_OTHER): Payer: Self-pay | Admitting: Family

## 2018-06-15 NOTE — Telephone Encounter (Signed)
Flu, RSV negative waiting for COVID-19 send out results.   Please notify patient of results so far and that we are waiting for COVID results in another 24-48 hours.  Please reevaluate patient for progression of symptoms.  Please ask patient to continue home isolation until COVID results are back.    Results for orders placed or performed in visit on 06/09/18   Strep Test (POCT)   Result Value Ref Range    Rapid Group A Strep Screen negative Neg    Strep Test, Control pass    Upper Respiratory Pathogen Nucleic Acid Detection Test Viral Transport Media   Result Value Ref Range    Adenovirus PCR Not Detected     Coronavirus (non-COVID-19) PCR Not Detected     Human Metapneumovirus PCR Not Detected     Human Rhinovirus/Enterovirus PCR Not Detected     Influenza A PCR Not Detected     Influenza A H1 PCR Not Detected     Influenza A H1-2009 PCR Not Detected     Influenza A H3 PCR Not Detected     Influenza B PCR Not Detected     Parainfluenza 1 PCR Not Detected     Parainfluenza 2 PCR Not Detected     Parainfluenza 3 PCR Not Detected     Parainfluenza 4 PCR Not Detected     Respiratory Syncytial Virus A PCR Not Detected     Respiratory Syncytial Virus B PCR Not Detected     Chlamydophila pneumoniae PCR Not Detected     Mycoplasma pneumoniae PCR Not Detected    Strep Culture Group A [place] eSwab Throat   Result Value Ref Range    Strep Group A Culture Result No growth of Group A Streptococcus       Marca Ancona, NP-C

## 2018-06-15 NOTE — Telephone Encounter (Signed)
Notified Patient of results so far (Negative flu and RSV) and that we are waiting for COVID-19 results in another 24-48 hours.      Re-evaluated patient for progression of symptoms:     . Is the patient short of breath, having difficulty breathing?  no  . Is the patient coughing?  no  . Has the cough changed?  no  . What have the measured temperature at home in the last 24 hours?  normal  . Does the patient have fatigue or sound sick or weak? no  . Is the patient answering questions appropriately without evidence of confusion?  yes   . Does the patient have any vomiting or diarrhea?  no  . Can you tolerate liquids? yes   . Does the patient need ED referral?  no  Other comments:   Pt feeling better    Informed patient to continue home isolation until COVID-19 results are back. Yes   Informed patient will follow up with a phone call in 24 hours until Covid-19 results come back. Yes   Provided Telephone Contact during Work Hours [phone] Yes  Reviewed indications for Emergency Department Referral: Yes   . Shortness of breath or difficulty getting air   . Chest pain  . Confusion or disorientation  . Dehydration or uncontrolled vomiting or diarrhea

## 2018-06-16 ENCOUNTER — Telehealth (INDEPENDENT_AMBULATORY_CARE_PROVIDER_SITE_OTHER): Payer: Self-pay | Admitting: Student in an Organized Health Care Education/Training Program

## 2018-06-16 NOTE — Telephone Encounter (Signed)
In light of the [place] pandemic, I called the patient to confirm whether she is planning to attend her scheduled dermatology visit with me next Thursday or if she would prefer to defer until a later date.  We had planned to have the patient return for skin biopsy of lesion on abdomen.  Per chart review, patient is currently awaiting results from [place] testing.  I left message stating that we still advised that she come in for biopsy if she is feeling well but that if she is not feeling well or prefers to reschedule for a later date she should call our office to reschedule.     Trisha Mangle, MD  Dermatology Resident

## 2018-06-16 NOTE — Telephone Encounter (Signed)
Left message for patient to call back COVID-19 line [phone]

## 2018-06-17 NOTE — Telephone Encounter (Signed)
Left message for patient to call back COVID-19 line [phone]

## 2018-06-18 NOTE — Telephone Encounter (Signed)
Patient aware we are waiting for COVID-19 results in     Re-evaluated patient for progression of symptoms:     . Is the patient short of breath, having difficulty breathing?  no  . Is the patient coughing?  no  . Has the cough changed?  no  . What have the measured temperature at home in the last 24 hours?  normal  . Does the patient have fatigue or sound sick or weak? no  . Is the patient answering questions appropriately without evidence of confusion?  yes   . Does the patient have any vomiting or diarrhea?  no  . Can you tolerate liquids? yes   . Does the patient need ED referral?  no  Other comments:   Pt feeling better    Informed patient to continue home isolation until COVID-19 results are back. Yes   Informed patient will follow up with a phone call in 24 hours until Covid-19 results come back. Yes   Provided Telephone Contact during Work Hours [phone] Yes  Reviewed indications for Emergency Department Referral: Yes   . Shortness of breath or difficulty getting air   . Chest pain  . Confusion or disorientation  . Dehydration or uncontrolled vomiting or diarrhea

## 2018-06-20 NOTE — Telephone Encounter (Signed)
Left message for patient to call back COVID-19 line [phone]

## 2018-06-21 NOTE — Telephone Encounter (Signed)
Left message for patient to call back for COVID-19 Follow Up at  [phone]

## 2018-06-22 ENCOUNTER — Telehealth (INDEPENDENT_AMBULATORY_CARE_PROVIDER_SITE_OTHER): Payer: Self-pay | Admitting: Student in an Organized Health Care Education/Training Program

## 2018-06-22 NOTE — Telephone Encounter (Signed)
I called the patient again to inform her that we would not be able to see her in dermatology clinic while COVID-19 testing is still pending.   I will reschedule her for 3:30pm on 06/30/18, by which time hopefully her COVID-19 results will be back.

## 2018-06-22 NOTE — Telephone Encounter (Signed)
DA RE-CHECK   Patient was verified by last name and BD    Re-evaluated patient for progression of symptoms:     . Is the patient short of breath, having difficulty breathing?  NO  . Is the patient coughing?  NO  . Has the cough changed?  NO  . What have the measured temperature at home in the last 24 hours?  97.6 temp  . Does the patient have fatigue or sound sick or weak? NO  . Is the patient answering questions appropriately without evidence of confusion?  YES  . Does the patient have any vomiting or diarrhea?  NO  . Can you tolerate liquids? YES  . Does the patient need ED referral?  NO  . Other comments:   . Patient stated that she has been feeling wellfor the last week, temp today 97.6, no cough, shortness of breath, no fatigue, no nausea, vomit or diarrhea, also stated that she is back at work      Informed patient to continue home isolation until COVID-19 results are back. NO    Informed patient will Follow up with a phone call in 24 hours until Covid-19 results come back. YES    Provided Telephone Contact During Work Hours [phone] YES      Reviewed indications for Emergency Department Referral:YES  . Shortness of breath or difficulty getting air   . Chest pain  . Confusion or disorientation  . Dehydration or uncontrolled vomiting or diarrhea

## 2018-06-22 NOTE — Telephone Encounter (Signed)
I called the patient to discuss whether she is planning to attend her appointment with me tomorrow.   She would still like to come in for biopsy.  She is feeling well had has been working for the past two weeks. Results of COVID-19 testing that was performed on 3/14 are still pending.  I will confer with our administration about our policy regarding currently asymptomatic patients with pending COVID test results.

## 2018-06-23 ENCOUNTER — Telehealth (INDEPENDENT_AMBULATORY_CARE_PROVIDER_SITE_OTHER): Payer: Self-pay | Admitting: Student in an Organized Health Care Education/Training Program

## 2018-06-23 ENCOUNTER — Telehealth (INDEPENDENT_AMBULATORY_CARE_PROVIDER_SITE_OTHER): Payer: Self-pay | Admitting: Family

## 2018-06-23 ENCOUNTER — Ambulatory Visit (INDEPENDENT_AMBULATORY_CARE_PROVIDER_SITE_OTHER): Payer: HMO | Admitting: Student in an Organized Health Care Education/Training Program

## 2018-06-23 ENCOUNTER — Ambulatory Visit (INDEPENDENT_AMBULATORY_CARE_PROVIDER_SITE_OTHER): Payer: Self-pay | Admitting: Family Medicine

## 2018-06-23 LAB — CORONAVIRUS COVID-19 DETECTION ASSAY (SEND OUT): COVID-19 Coronavirus by PCR – External Lab: NOT DETECTED

## 2018-06-23 NOTE — Telephone Encounter (Signed)
Patient notified test for COVID-19 are NEGATIVE:    Patient instructions given below:  Because this is a newer test the CDC and [place] still recommend that you leave home only after these three things have happened:    1. You have had no fever for at least 72 hours (that is three full days of no fever without the use medicine that reduces fevers)  2. Other symptoms have improved (for example, when your cough or shortness of breath have improved)  3. At least 7 days have passed since your symptoms first appeared    Contact our dedicated nurse line [phone], if you are noting worsening respiratory symptoms and need advice. This line is open 8 am -5 pm 7 days a week.  If sudden and severe worsening in symptoms please call 911.      Thank you,  Ruth Hall Lattie Corns, LVN  Signature Derived From [place], June 23, 2018, 1:47 PM

## 2018-06-23 NOTE — Telephone Encounter (Signed)
PROVIDER ACTION REQUESTED: NO, FYI Only  RN ACTION: Advice given     Pt is calling to ff up on her COVID 19  testing result that was done on 03/14 at a drive up location.  Pt sts she needs her result today because she has a biopsy procedure at 3pm today and the clinic will cancel it if they dont have the result.  Get in touch with the clinic and spoke with Joe RN to ff up with pts concern.  Pt reports no symptoms at this time.  Will route for FYI.    Reason for Call: Information     Disposition: Information or Advice Only Call       Has patient received 19/20 flu shot: no  Appt scheduled: No    Reason for Disposition  . General information question, no triage required and triager able to answer question    Additional Information  . Negative: [1] Caller is not with the adult (patient) AND [2] reporting urgent symptoms  . Negative: Lab result questions  . Negative: Medication questions  . Negative: Caller can't be reached by phone  . Negative: Caller has already spoken to PCP or another triager  . Negative: RN needs further essential information from caller in order to complete triage  . Negative: Requesting regular office appointment  . Negative: [1] Caller requesting NON-URGENT health information AND [2] PCP's office is the best resource  . Negative: Health Information question, no triage required and triager able to answer question    Answer Assessment - Initial Assessment Questions  1. REASON FOR CALL or QUESTION: "What is your reason for calling today?" or "How can I best help you?" or "What question do you have that I can help answer?"      Pt is calling  Asking for her result of COVID 19 testing that was done on 03/14 .      No result until now. In Epic.     Pt wanted to know the result because she has a biopsy scheduled today at 3pm and the clinic will cancel it if will now have the result by then.     Pt sts shes been calling the COVID result line @ [phone] but was told the same thing.     Pt sts she  didn't even know why she was sent out for testing in the first place. No reports of any symptoms right now.    Protocols used: INFORMATION ONLY CALL-A-AH

## 2018-06-23 NOTE — Telephone Encounter (Signed)
Triage/Float RN Note:     Per Provider Dermatologist, Dr, Judee Clara:       Spoke to patient after seeing negative COVID19 results and offered for her to come in today for punch biopsy as originally scheduled.  She is no longer available this afternoon. She asks to be rescheduled on 07/07/18.      Pt to call back PCP's clinic for any questions or concerns. Closing encounter

## 2018-06-23 NOTE — Telephone Encounter (Signed)
Patient's [id number]-09 testing from 3/14 resulted negative today.  In light of the negative testing, called the patient and offered her an appointment to come in this afternoon for punch biopsy. She has changed her plans and is no longer able to come in.  She requests to be rescheduled for 07/07/18.  We will reschedule her for 07/07/18 at 3:30pm.

## 2018-06-23 NOTE — Telephone Encounter (Signed)
[facility]    Your test for COVID-19 is NEGATIVE:    Because this is a newer test we still recommend that you leave home after these three things have happened:    1. You have had no fever for at least 72 hours (that is three full days of no fever without the use medicine that reduces fevers)  AND  2. other symptoms have improved (for example, when your cough or shortness of breath have improved)  AND  3. at least 7 days have passed since your symptoms first appeared    Contact our dedicated nurse line [phone], if you are noting worsening respiratory symptoms and need advice. This line is open 8 am -5 pm 7 days a week.  If sudden and severe worsening in symptoms please call 911.      Thank you,  Jonetta Speak, NP  Signature Derived From [place], June 23, 2018, 1:41 PM

## 2018-06-23 NOTE — Telephone Encounter (Signed)
Left message for patient to call back for COVID-19 Follow Up at  [phone]

## 2018-06-23 NOTE — Telephone Encounter (Signed)
Triage/ Float RN Note      Situation: Pt has scheduled biopsy with Dermatology today (reference Dr. Judee Clara encounter 06/22/18):     I called the patient again to inform her that we would not be able to see her in dermatology clinic while COVID-19 testing is still pending.   I will reschedule her for 3:30pm on 06/30/18, by which time hopefully her COVID-19 results will be back.          COVID 19 conducted on 06/11/18- test results still in process and not released as of 06/23/18. RN tried to call [place] lab called [facility]  # [phone], transferred to [place] tranfered to [facility] lab # [phone]. Spoke to [place]  and reports labs being processed  should be released by 1200 today 06/23/18.     Routes to Provider to release labs once available.     Jannifer Franklin, MSN, RN   [facility]  Float/ Triage Nurse,   Email: [email or web address]

## 2018-06-30 ENCOUNTER — Ambulatory Visit (INDEPENDENT_AMBULATORY_CARE_PROVIDER_SITE_OTHER): Payer: HMO | Admitting: Student in an Organized Health Care Education/Training Program

## 2018-07-06 NOTE — Progress Notes (Signed)
HISTORY:  Ruth Hall is a 32 year old adult here for follow up on growth on abdomen.  Last seen by me on 05/05/18 at which time biopsy of lesion was recommended. Patient deferred biopsy due to upcoming camping trip.  No new concerns today.  She would like to proceed with biopsy of the nodule on her abdomen.     Location: abdomen, immediately above C-section scar   Duration: 6-years  Symptoms: intermittent tenderness. Not tender today but sometimes hurts a lot after she goes hiking or drives a long time.  Feels better with icing or massage.   Current treatment:  none  Prior treatment: None  Aggravating/alleviating factors: Worse with exercise, friction   Other information: First stated as a small brown bump 6-7 years ago. She was told by a doctor that it was an ingrown hair.  Grew over the next 4-5 years but hasn't changed size in the past 2 years.  She hopes it is a cyst.     PAST DERM HISTORY:  Melanoma hx: negative   NMSC hx: negative     FAMILY HISTORY:  Melanoma hx: negative   NMSC hx: negative     PAST MEDICAL HISTORY:  The patient  has a past medical history of HPV in female, Migraine, and Obesity.    SOCIAL HISTORY:  - Enjoys hiking   - Works at [employer] in [place]    MEDICATIONS: Reviewed     Allergies: Penicillins    REVIEW OF SYSTEMS:  CONSTITUTIONAL: Negative for fever or chills  DERMATOLOGIC: Feels well, no other skin complaints.    PHYSICAL EXAM:  Skin Type:  2  General Appearance: within normal limits  Neuro: Alert and oriented  Psych: Mood and affect within normal limits  Areas examined included: abdomen    All was within normal limits except:   - on the midline abdomen immediately superior to her C-section scar is a hyperpigmented 2 cm x 2 cm nodule     ASSESSMENT AND TREATMENT PLAN:    Neoplasm of skin of uncertain behavior  Lesion on mildline lower abdomen concerning or DFSP, other malignancy, or granulomatous reaction to cyst.    Discussed potential diagnoses with patient and, after  discussion of relative risks/benefits of diagnostic options (biopsy vs. clinical monitoring) patient consented to biopsy of the lesion at today's visit. Shave biopsy was obtained (see procedure note below). Further follow-up will depend on pathology results.   Punch biopsy procedure note.  The patient was consented for the procedure.  The risks including bleeding, infection, and scar were discussed.  Time out: performed with two patient identifiers and a witness.  Correct patient:  Yes  Correct site: Yes (Pt confirmed location)   After informed written consent was obtained, using alcohol for cleansing and 0.5% lidocaine with 1:200,000 epinephrine for anesthetic, with sterile technique a 4 mm  punch biopsy was used to obtain a biopsy specimen of the lesion. Hemostasis was obtained by pressure and wound was closed with 2 simple interrupted sutures. Vaseline was applied, and wound care instructions provided. The specimen was labeled and sent to pathology for evaluation. The procedure was well tolerated without complications.   Suture removal kit provided. Patient to remove her own sutures in 2 weeks or to call the clinic if she needs assistance in removing them.       Return pending biopsy results .     Ellen Haddock, MD   Dermatology Resident

## 2018-07-06 NOTE — Patient Instructions (Signed)
WOUND CARE INSTRUCTIONS AND BIOPSY RESULTS     Take the bandage off after one day. Begin gently cleaning the area once a day with soap and water to remove the dried blood or crust. The shower is a good place to do the cleaning.   Apply Vaseline ointment daily to keep the wound "greasy" as wounds heal faster if they do not dry out. Antibiotic ointments such as Neosporin and Bacitracin are not recommended because they may cause allergic reactions.    Afterwards, apply a bandage. If there are stitches, continue using the ointment until the stitches are removed. If there are no stitches, use the ointment until there is no more crust forming. Wounds on the back and leg take longer to heal. Exposing the wound to air to dry out and form a scab is not recommended.  While the biopsy or surgery site is healing, do not go into the ocean, pool, or other dirty water. Showering and cleaning as instructed above is recommended.     HELPING THE HEALING   You can take Tylenol if you experience pain after the procedure. Avoid aspirin or Motrin as these medications can increase bleeding. Ice packs may minimize swelling, especially for surgery done on the face. Without taking off the bandage, use an ice pack for 10-15 minutes per hour on the affected area until bedtime. Ice should be wrapped in a plastic and then a cloth to avoid burning your skin and wetting your bandage. Elevate the part of the body that has had surgery, to minimize bleeding and swelling. If the surgery is on your face or head, use several pillows to sleep on the first few nights, to keep blood draining out of the head instead of into it. Remember not to bend over as well.     IF YOU HAD STITCHES   If you had a surgery with the placement of stitches, you must be careful not to overstretch the area. As the wound heals, it will be weaker than the surrounding skin and can even "pop open" if stretched too much. This risk of opening up is greatest from the time of the  surgery until about 7-10 days after the sutures are removed. Arrange your schedule so that the area of the body operated upon will be relatively inactive for that period of time. This usually applies to exercise routines or sports activity. Avoid heavy lifting.     IF YOU START BLEEDING   If bleeding starts put continuous pressure on the area for 20 minutes without peeking. If bleeding continues, hold pressure for an additional 20 minutes without peeking. If bleeding still continues, contact the Dermatology Department at [phone] during clinic hours. If after clinic hours or on weekends please contact the [facility] paging operator at [phone] and ask them to page the Dermatology resident on call.      SIGNS OF INFECTION   A small area (1/4-inch) of redness is a normal part of the healing process. If you experience more redness, swelling, pain, pus, or drainage contact the Dermatology Department at [phone] during clinic hours. If after clinic hours or on weekends please contact the [facility] paging operator at [phone] and ask them to page the Dermatology resident on call.    BIOPSY RESULTS   We will notify you as soon as we receive your biopsy results, which can take about 2 weeks and sometimes longer. If you have not heard from us after 2-3 weeks please contact   the Dermatology Department at [phone].

## 2018-07-07 ENCOUNTER — Encounter (INDEPENDENT_AMBULATORY_CARE_PROVIDER_SITE_OTHER): Payer: Self-pay | Admitting: Student in an Organized Health Care Education/Training Program

## 2018-07-07 ENCOUNTER — Ambulatory Visit (INDEPENDENT_AMBULATORY_CARE_PROVIDER_SITE_OTHER): Payer: HMO | Admitting: Student in an Organized Health Care Education/Training Program

## 2018-07-07 DIAGNOSIS — N806 Endometriosis in cutaneous scar: Secondary | ICD-10-CM

## 2018-07-07 DIAGNOSIS — D485 Neoplasm of uncertain behavior of skin: Secondary | ICD-10-CM

## 2018-07-07 NOTE — Telephone Encounter (Signed)
Pt called back to return your phone call. Please advise pt: [phone]

## 2018-07-08 ENCOUNTER — Telehealth (INDEPENDENT_AMBULATORY_CARE_PROVIDER_SITE_OTHER): Payer: Self-pay | Admitting: Student in an Organized Health Care Education/Training Program

## 2018-07-08 DIAGNOSIS — N806 Endometriosis in cutaneous scar: Principal | ICD-10-CM

## 2018-07-08 NOTE — Telephone Encounter (Signed)
Called patient to discuss biopsy results, as below.    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   - Patient does not have prior known diagnosis of endometriosis and does not have particularly painful periods.   - Recommended that the patient discuss the diagnosis with her gynecologist or PCP to formulate treatment plan.  Discussed that treatment options include surgical excision or hormonal management.

## 2018-07-11 ENCOUNTER — Encounter (INDEPENDENT_AMBULATORY_CARE_PROVIDER_SITE_OTHER): Payer: Self-pay | Admitting: Student in an Organized Health Care Education/Training Program

## 2018-07-11 ENCOUNTER — Encounter (INDEPENDENT_AMBULATORY_CARE_PROVIDER_SITE_OTHER): Payer: Self-pay

## 2018-07-11 DIAGNOSIS — N806 Endometriosis in cutaneous scar: Principal | ICD-10-CM

## 2018-07-11 NOTE — Telephone Encounter (Signed)
Referral to gynecology placed for discussion of management of cutaneous endometriosis.

## 2018-11-08 ENCOUNTER — Ambulatory Visit (INDEPENDENT_AMBULATORY_CARE_PROVIDER_SITE_OTHER): Payer: Self-pay | Admitting: Family Medicine

## 2018-11-08 ENCOUNTER — Encounter (INDEPENDENT_AMBULATORY_CARE_PROVIDER_SITE_OTHER): Payer: Self-pay | Admitting: Family Medicine

## 2018-11-08 NOTE — Telephone Encounter (Signed)
Symptom Call          Next office visit:  Visit date not found    Did you offer Express Care/Urgent Care: (if met criteria for Same Day/3 Day scheduling but declines to schedule) Yes    What symptom is the patient experiencing? Waking up shaky, dizzy, anxiety, back pain.       Name of PCP Provider: Truddie Hidden   Insurance Coverage Verified: Active- in network  Last office visit: 06/09/2018    Who is reporting the symptoms? Incoming call from patient    Is this a new or ongoing symptom? ongoing  Estimated time since experiencing symptom(s)? 3 weeks    Best way to contact patient: [place]

## 2018-11-08 NOTE — Telephone Encounter (Signed)
33yoF pt with shakiness, anxiety x 3 weeks and back pain x 1 week. Pt on phertermine high dose x 1 month, noted possible side effect for 3 weeks. Scheduled appt tomorrow with PCP    PROVIDER ACTION REQUESTED: NO, FYI Only    CLINIC/RN/LVN ACTION REQUEST: NO, FYI Only    NT RN ACTION: Acute appt scheduled  COVID TRIAGE PROTOCOL: NEGATIVE     Reason for Call: Back Pain     Disposition: See PCP When Office is Open (Within 3 Days)       Appt scheduled: Yes     Future Appointments   Date Time Provider [place]   11/09/2018  9:20 AM Truddie Hidden, MD [facility]         Reason for Disposition  . [1] MODERATE back pain (e.g., interferes with normal activities) AND [2] present > 3 days    Additional Information  . Negative: Passed out (i.e., lost consciousness, collapsed and was not responding)  . Negative: Shock suspected (e.g., cold/pale/clammy skin, too weak to stand, low BP, rapid pulse)  . Negative: Sounds like a life-threatening emergency to the triager  . Negative: Major injury to the back (e.g., MVA, fall > 10 feet or 3 meters, penetrating injury, etc.)  . Negative: Followed a tailbone injury  . Negative: [1] Pain in the upper back over the ribs (rib cage) AND [2] radiates (travels, goes) into chest  . Negative: [1] Pain in the upper back over the ribs (rib cage) AND [2] worsened by coughing (or clearly increases with breathing)  . Negative: Back pain during pregnancy  . Negative: Pain mainly in flank (i.e., in the side, over the lower ribs or just below the ribs)  . Negative: [1] SEVERE back pain (e.g., excruciating) AND [2] sudden onset AND [3] age > 53  . Negative: [1] Unable to urinate (or only a few drops) > 4 hours AND [2] bladder feels very full (e.g., palpable bladder or strong urge to urinate)  . Negative: [1] Loss of bladder or bowel control (urine or bowel incontinence; wetting self, leaking stool) AND [2] new onset  . Negative: Numbness in groin or rectal area (i.e., loss of  sensation)  . Negative: [1] SEVERE abdominal pain AND [2] present > 1 hour  . Negative: [1] Abdominal pain AND [2] age > 40  . Negative: Weakness of a leg or foot (e.g., unable to bear weight, dragging foot)  . Negative: Unable to walk  . Negative: Patient sounds very sick or weak to the triager  . Negative: [1] SEVERE back pain (e.g., excruciating, unable to do any normal activities) AND [2] not improved 2 hours after pain medicine  . Negative: [1] Pain radiates into the thigh or further down the leg AND [2] both legs  . Negative: [1] Fever > 100.0 F (37.8 C) AND [2] flank pain (i.e., in side, below ribs and above hip)  . Negative: [1] Pain or burning with passing urine (urination) AND [2] flank pain (i.e., in side, below ribs and above hip)  . Negative: Numbness in a leg or foot (i.e., loss of sensation)  . Negative: [1] Numbness in an arm or hand (i.e., loss of sensation) AND [2] upper back pain  . Negative: High-risk adult (e.g., history of cancer, HIV, or IV drug abuse)  . Negative: [1] Fever AND [2] no symptoms of UTI  (Exception: has generalized muscle pains, not localized back pain)  . Negative: Rash in same area as pain (  may be described as "small blisters")  . Negative: Blood in urine (red, pink, or tea-colored)    Answer Assessment - Initial Assessment Questions  1. ONSET: "When did the pain begin?"       Wednesday last week, worsening    2. LOCATION: "Where does it hurt?" (upper, mid or lower back)      Shoulder blades, left side    3. SEVERITY: "How bad is the pain?"  (e.g., Scale 1-10; mild, moderate, or severe)    - MILD (1-3): doesn't interfere with normal activities     - MODERATE (4-7): interferes with normal activities or awakens from sleep     - SEVERE (8-10): excruciating pain, unable to do any normal activities       Last night was 9/10. Now its 5/10    4. PATTERN: "Is the pain constant?" (e.g., yes, no; constant, intermittent)       Intermittent, more at nighttime    5. RADIATION: "Does the  pain shoot into your legs or elsewhere?"      Stays in upper left side    6. CAUSE:  "What do you think is causing the back pain?"       Lifted some printer paper boxes last week, pain noted but went away. Then came back    7. BACK OVERUSE:  "Any recent lifting of heavy objects, strenuous work or exercise?"      Heavy objects    8. MEDICATIONS: "What have you taken so far for the pain?" (e.g., nothing, acetaminophen, NSAIDS)      NSAID today     9. NEUROLOGIC SYMPTOMS: "Do you have any weakness, numbness, or problems with bowel/bladder control?"      No    10. OTHER SYMPTOMS: "Do you have any other symptoms?" (e.g., fever, abdominal pain, burning with urination, blood in urine)        No    11. PREGNANCY: "Is there any chance you are pregnant?" (e.g., yes, no; LMP)        No    Protocols used: BACK PAIN-A-AH

## 2018-11-08 NOTE — Telephone Encounter (Signed)
From: Luane School  To: Truddie Hidden, MD  Sent: 11/08/2018 11:16 AM PDT  Subject: Nevin Bloodgood Morning Dr. Luvenia Heller,    Its Ruth Hall im a patient of yours at [facility] in [place], I have been having really bad anxiety every morning for the past 3 weeks, but this past Saturday it lasted longer than in normally does. I wake up shaky, I feel like Im hyperventilating. Im experiencing dizziness and wanting to throw up. I feel like crying over everything and anything. I overthinking about everything. My back pain doesnt help with what ever is going on with me at the moment. Im not sure if Im able to make an appointment with you due to covid . Can you please suggest what to do? Thank you in advance.     Sincerely,  Ruth Hall

## 2018-11-09 ENCOUNTER — Encounter (INDEPENDENT_AMBULATORY_CARE_PROVIDER_SITE_OTHER): Payer: Self-pay | Admitting: Family Medicine

## 2018-11-09 ENCOUNTER — Other Ambulatory Visit: Payer: Self-pay

## 2018-11-09 ENCOUNTER — Encounter (INDEPENDENT_AMBULATORY_CARE_PROVIDER_SITE_OTHER): Payer: Self-pay | Admitting: Hospital

## 2018-11-09 ENCOUNTER — Telehealth (INDEPENDENT_AMBULATORY_CARE_PROVIDER_SITE_OTHER): Payer: HMO | Admitting: Family Medicine

## 2018-11-09 DIAGNOSIS — Z23 Encounter for immunization: Secondary | ICD-10-CM

## 2018-11-09 DIAGNOSIS — F419 Anxiety disorder, unspecified: Secondary | ICD-10-CM

## 2018-11-09 MED ORDER — LORAZEPAM 0.5 MG OR TABS
0.5000 mg | ORAL_TABLET | Freq: Four times a day (QID) | ORAL | 0 refills | Status: DC | PRN
Start: 2018-11-09 — End: 2020-08-20

## 2018-11-09 NOTE — Telephone Encounter (Signed)
Action taken by Care Assist MA:    Pt spoke with triage regarding symptoms, MCVV is scheduled on 11/09/2018 with PCP.  Closing Encounter    Encounter created by Care Assist MA.

## 2018-11-09 NOTE — Progress Notes (Signed)
---------------------(data below generated by Truddie Hidden, MD)--------------------    Patient Verification & Telemedicine Consent:    I am proceeding with this evaluation at the direct request of the patient.  I have verified this is the correct patient and have obtained verbal consent and written consent from the patient/ surrogate to perform this voluntary telemedicine evaluation (including obtaining history, performing examination and reviewing data provided by the patient).   The patient/ surrogate has the right to refuse this evaluation.  I have explained risks (including potential loss of confidentiality), benefits, alternatives, and the potential need for subsequent face to face care. Patient/ surrogate understands that there is a risk of medical inaccuracies given that our recommendations will be made based on reported data (and we must therefore assume this information is accurate).  Knowing that there is a risk that this information is not reported accurately, and that the telemedicine video, audio, or data feed may be incomplete, the patient agrees to proceed with evaluation and holds [place] harmless knowing these risks. In this evaluation, we will be providing recommendations only. The patient/ surrogate has been notified that other healthcare professionals (including students, residents and Metallurgist) may be involved in this audio-video evaluation.   All laws concerning confidentiality and patient access to medical records and copies of medical records apply to telemedicine.  The patient/ surrogate has received the [place] Notice of Privacy Practices.  I have reviewed this above verification and consent paragraph with the patient/ surrogate.  If the patient is not capacitated to understand the above, and no surrogate is available, since this is not an emergency evaluation, the visit will be rescheduled until such time that the patient can consent, or the surrogate is available to  consent.    Demographics:   Medical Record #: [id number]   Date: November 09, 2018   Patient Name: Ruth Hall   DOB: May 01, 1985  Age: 33 year old  Sex: adult  Location: Home address on file    Evaluator(s):   Louanna Vanliew was evaluated by me today.    Clinic Location: [facility]  [address]  [place] [address]      Ruth Hall is a 16 yoG1P1001 w/hx HPV, former smoker, BMI 36,  and depression, who presents for increased anxiety.    Started phentermine for weight loss ~6 weeks ago. Three weeks ago, developed increasing anxiety and irritability. 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  Lives with Mom, 1yo son, and son's father  Anxious, snaps at every body at home  No SI, HI    Hx anxiety in the remote past, was on citalopram which was helpful.    I reviewed the patient's medical record, including PMH, SH, medications, allergies, and ROS.    Physical Exam: General Appearance: healthy, alert, no distress, pleasant affect, cooperative.  Mental Status: Behavior :normal  Eye Contact: normal  Attention Span: good  Speech: normal volume, rate, and pitch  Mood (pt's report) :crying spells, insomnia, early AM awakening, anxiety and impaired concentration  Affect: full and appropriate  Suicidal Ideation: no  Homicidal Ideation: no    ASSESSMENT/PLAN:    Ruth Hall was seen today for anxiety.    Diagnoses and all orders for this visit:  Anxiety  -     LORazepam (ATIVAN) 0.5 MG tablet; Take 1 tablet (0.5 mg) by mouth every 6 hours as needed for Anxiety.  -     Consult/Referral to [place]    Other orders  -     CURES Review Documentation - I Reviewed CURES

## 2018-11-09 NOTE — Progress Notes (Signed)
Collaborative Care Encounter 11/09/2018   Length of Session 20   Session Number 1   Unit of Consultation Individual   Mode of Consultation In office follow-up visit brief psychotherapy;Telephone office visit follow-up   Anxiety Disorder or GAD Follow-Up Anxiety Symptoms   Goal of Consult (Intake) Referral   Plan of Action Follow-up via MyChart;Referral   Referral to (If Applicable) Collaborative Care   What providers collaborated with [facility] clinician during encounter? Primary Care Physician- Attending   How was the visit initiated? Physician detected problem during encounter   What providers will be involved in patient follow-up? Collaborative Care Clinician   Mode used to collaborate about patient Send staff message to patient provider and/or other treatment team members   Some recent data might be hidden           BHP met with pt during medical visit at request of Dr.Rosenblum to address sxs of anxiety. BHP identified self as a Development worker, international aid under direct clinical supervision of Fredric Mare Ph.D., [place] [phone]). BHP provided information about Integrated Behavioral care, procedure for scheduling first appt, and fee options, and pt statet they would like to pursue services. Pt verbalized understanding and consented to meet with BHP.     Pt reports symptoms of anxiety on a daily basis: Irritability, shaking, crying spells, low concentration, low appetite, insomnia. Pt stated that during the weekend her anxiety increased and it resulted in her throwing up. The symptoms started 3 weeks ago and pt thinks it is related to a 'weight loss medication'. She stopped the medication a week ago but is still experiencing anxiety symptoms. BHP assessed for SI/HI and pt denied plan, means, and intent. BHP provided pt with 5,4,3,2,1 Grounding technique. BHP also provided pt with information regarding sleep hygiene (Bluelight/screens, caffeine/stimulant, going to bed sleepy). BHP sent pt progressive muscle relaxation through mychart and  provided some psychoeducation about mindfulness.     Pt and behavioral health provider agreed to the following plan:   Pt will not go to bed until she feels sleepy and not stay in bed awake.   Pt will use Progressive Muscle Relaxation once per day.    Pt referred to [facility].   BHP scheduled pt for the following week.    I have read this encounter note and agree with therapist's activities and recommendations. - Fredric Mare, PhD, LMFT

## 2018-11-10 NOTE — Progress Notes (Signed)
[facility] Intake    Question 11/09/2018 10:55 AM PDT - Danley Danker by Patient   What is your current relationship status: Cohabitating   Which category best describes your race? (One or more categories may be marked) Some other race   What is your highest level of education College Year - 1   Do you currently serve as a primary caregiver for a:    Physically dependent adult (e.g., aging parent)? No   Child with a chronic illness (e.g. diabetes)? No   Have you ever received outpatient counseling/psychotherapy services? Yes   If yes, When and Where? Maybe 5 years ago   Are you currently seeing a psychiatrist? No   If yes, Who and Where?    Have you ever been hospitalized for psychiatric/mental health reasons? No   If yes, When and Where?    Have you ever had thoughts of harming yourself or someone else? No   If yes, When    Have you ever attempted suicide or intentionally severely hurt yourself or someone else? No   If yes, When    Please list the concerns that bring you in and rate the severity of each one:    Presenting Problem and Rating: 1    Problem: Anxity   Rating 5 - Moderate Problem   Presenting Problem and Rating : 2    Problem: Shaking   Rating 3 - Mild Problem   Presenting Problem and Rating : 3    Problem: Snaping   Rating 5 - Moderate Problem   Now thinking about the past 30 days, how many days did these concerns keep you from doing your usual activities, such as self-care, work/school, interaction with friends/loved ones, or recreation? (0-30) 5   How much do your concerns interfere with your Work/Academic Performance 2 - Mildly   How much do your concerns interfere with your Emotional Well-being 3 - Moderately   How much do your concerns interfere with your Social Relationships/Activities 2 - Mildly   How much do your concerns interfere with your Daily Routine 2 - Mildly   In general how happy were you growing up? Somewhat   How satisfied are you with the level of help and support received from  important people. Somewhat Satisfied   How much do you agree with the following statement: I have a good sense of what makes my life meaningful. Agree   Sometimes things happen to people that are extremely upsetting--things like being in a life threatening situation like a major disaster, very serious accident or fire; being physically assaulted, or threatened with a weapon; being physically forced to have sex, or having your private body parts touched against your wishes; seeing another person killed or dead, or badly hurt, or hearing about something horrible that has happened to someone you are close to. At any time during your life, have any of these kinds of things happened to you? Yes   If "yes", do any of these experiences still bother or affect your life today in anyway? Yes   How would you rate your quality of sleep? Poor   How much caffeine (i.e. coffee, colas, energy drinks) do you drink each day on average? 12-24oz or 1-2 cups/cans   Do you currently smoke cigarettes? No   If "yes", how many cigarettes do you smoke per day? (1-100)    How many alcoholic beverages (beer/wine/liquor) do you drink per week on average? (0-100) 1   When was the last  time you had more than 4 drinks (women) or 5 drinks (men) in one day? More than a year ago   In addition to the substances mentioned above, when was the last time you used any drug including marijuana and/or prescription drugs that were not prescribed by a doctor? Never   How many times a week, do you usually do 20 minutes of vigorous physical activity that makes you sweat or puff and pant? (for example, jogging, heavy lifting, digging, aerobics, or fast bicycling) 1-2 times a week   How many times a week, do you usually do 30 minutes of moderate physical activity or walking that increases your heart rate or makes you breath harder than normal? (for example, mowing the lawn, carrying light loads, bicycling at a regular pace, or playing doubles tennis) 1-2 times  a week   How many hours a day do you spend watching TV, on the computer, or playing video games? 1-2 times a week   How much do you agree that spirituality/meditation/religion is an important part of your life. Disagree   Would you like to integrate your religion/spirituality, cultural, or alternative healing practices into treatment? No   How?              Phq8 Depression Questionnaire    Question 11/09/2018 10:50 AM PDT - Filed by Patient   Over the last two weeks, how often have you been bothered by any of the following problems?    Little interest or pleasure in doing things More than half the days   Feeling down, depressed, or hopeless More than half the days   Trouble falling or staying asleep, or sleeping too much? Several days   Feeling tired or having little energy Several days   Poor appetite or overeating Not at all   Feeling bad about yourself--or that you are a failure to have let yourself or your family down More than half the days   Trouble concentrating on things, such as reading the newspaper or watching television More than half the days   Moving or speaking so slowly that other people could have noticed. Or the opposite--being so fidgety or restless that you have been moving around a lot more than usual Not at all   If you checked off any problems, how difficult have these problems made it for you to do your work, take care of things at home, or get along with other people? Somewhat difficult   Summary Score (range: 0 - 27) 10       [facility]    Question 11/09/2018 11:03 AM PDT - Danley Danker by Patient   Over the last 2 weeks, how often have you been bothered by the following problems?    Worrying too much about different things Several days    Trouble relaxing Several days    Being so restless that it is hard to sit still Several days    Being easily annoyed or irritable Nearly every day    Feeling afraid as if something awful might happen Not at all   If you checked off any problems, how  difficult have these problems made it for you to do your job along with other people? Somewhat difficult     Brief Health Screen (Audit & Dast)    Question 11/09/2018 11:00 AM PDT - Danley Danker by Patient   ALCOHOL    MEN: How many times in the past year have you had 5 or more drinks in a  day?    WOMEN: How many times in the past year have you had 4 or more drinks in a day? 1 or more   DRUGS    How many times in the past year have you used a recreational drug or used a prescription medication for non-medical reasons? None          [facility] Amb Audit Opt    Question 11/09/2018 11:00 AM PDT - Filed by Patient   1. How often do you have a drink containing alcohol? Monthly   2. How many drinks containing alcohol do you have on a typical day when you are drinking? 0-2   3. How often do you have four or more drinks on one occasion? Less than monthly   4. How often during the last year have you found that you were not able to stop drinking once you had started? Never   5. How often during the last year have you failed to do what was normally expected of you because of drinking? Never   6. How often during the last year have you needed a first drink in the morning to get yourself going after a heavy drinking session? Never   7. How often during the last year have you had a feeling of guilt or remorse after drinking? Never   8. How often during the last year have you been unable to remember what happened the night before because of your drinking? Never   9. Have you or someone else been injured because of your drinking? No   10. Has a relative, friend, doctor, or other health care worker been concerned about your drinking or suggested you cut down? No   11. Have you ever been in treatment for an alcohol problem? Never

## 2018-11-14 ENCOUNTER — Telehealth (INDEPENDENT_AMBULATORY_CARE_PROVIDER_SITE_OTHER): Payer: Self-pay

## 2018-11-14 ENCOUNTER — Other Ambulatory Visit: Payer: Self-pay

## 2018-11-14 DIAGNOSIS — F419 Anxiety disorder, unspecified: Secondary | ICD-10-CM

## 2018-11-16 ENCOUNTER — Encounter (INDEPENDENT_AMBULATORY_CARE_PROVIDER_SITE_OTHER): Payer: Self-pay | Admitting: Hospital

## 2018-11-16 NOTE — Progress Notes (Signed)
---------------------(data below generated by [facility], )--------------------    Patient Verification & Telemedicine Consent:    I am proceeding with this evaluation at the direct request of the patient.  I have verified this is the correct patient and have obtained verbal consent and written consent from the patient to perform this voluntary telemedicine evaluation. The patient has the right to refuse this evaluation.  I have explained risks (including potential loss of confidentiality), benefits, alternatives, and the potential need for subsequent face to face care. All laws concerning confidentiality and patient access to medical records and copies of medical records apply to telemedicine.  The patient has received the [place] Notice of Privacy Practices.  I have reviewed this above verification and consent paragraph with the patient.  If the patient is not capacitated to understand the above, and no surrogate is available, since this is not an emergency evaluation, the visit will be rescheduled until such time that the patient can consent, or the surrogate is available to consent.    Due to COVID-19 pandemic and a federally declared state of public health emergency, this service is being conducted via video in order to reduce exposure for staff and patients.    Demographics:   Medical Record #: [id number]   Date: November 16, 2018   Patient Name: Ruth Hall   DOB: [birth year]  Age: 33 year old  Sex: adult  Location: Home address on file    Evaluator(s):   Jacquetta Polhamus was evaluated by me today.    Clinic Location: [facility]  [address]  [place] [address]          Progress Notes  Length of Session: 61 Minutes  This was session number : 1  For therapy type : Individual  Treatment Model used was: Assesment  Mental/Functional Status  Mood was : Euthymic  Affect was: Appropriate  Speech was: Clear/normal  Thought process was: Coherent  Auditory  hallucinations reported: No  Visual hallucinations reported: No                        ICD-10-CM ICD-9-CM   1. Anxiety  F41.9 300.00       Goal(s) for treatment:   1. Pt will develop copying strategies to decrease her symptoms of anxiety.   2. Pt will create boundaries to decrease relational issues.     Description:   Psychologist, occupational Trainee met with pt who is a 43 year old, hispanic, female referred to [facility] by Dr. Luvenia Heller for symptoms of anxiety.  Patient presented to first session for intake and assessment. Initial intake session included informed consent form and related material, i.e. Confidentiality and limits thereof, mandatory reporting, cancellation policies, fees, and therapist contact information. Informed patient of trainee status and information of trainee's supervisor, Fredric Mare Ph.D., LMFT [phone]). Pt had the opportunity to ask questions and willingly agreed.     Assessment/Action: Gathered information on pt's presenting problem and history, symptoms of anxiety, treatment history, medication, alcohol and substance use, current risk for self harm & SI, attempted solutions to problem, exercise, protective factors, relationship and social history, brief family history,and initial goals. BHP provided psychoeducation about mindfulness/meditation and explained progressive muscle relaxation to pt. BHP also coached pt through a grounding technique that she could use if she feels that her symptoms are starting to spirale.     Response:  Pt is endorsing the  following symptoms consistent with anxiety on a daily basis: agitation, irritability, rumination, shakiness, decreased concentration, and insomnia. Pt experienced a panic attack that lasted for an hour 2 weeks ago. Her symptoms of anxiety started 3 weeks ago. Pt has received mental health care approximately 6 years ago for anxiety. She went to therapy and was on Lexapro and after her symptoms stabilized was successful  in weaning of the medication. Pt received a prescription for Lorazepam and was advised by her PCP to take as needed. So far she has been able to control her symptoms without medication. She stated that she would prefer to avoid medication if possible. Pt denied any substance use and stated that she drinks alcohol about once per month. Pt denied any intent, plans, and means regarding SI/HI. Pt hikes every Sunday with friends and stated that being in nature and exercising is a helpful copying strategy for her. Pt stated to be very independent and disciplined when it comes to making changes to decrease her symptoms. Pt reported that she is experiencing a lot of 'shaming and guilt' from her mother and culture when it comes to seeking services for mental health. Pt stated that her feelings and experiences are not validated by some of her family members. Pt reported that the father of her son is a great source of support, including her mental health.     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 Pt stated that working has been a welcome distraction these past 3 weeks. Pt lives with her mother, her 66 year old son, and the father of her son. Pt stated that living with her mother is getting more difficult because she is 'constantly criticizing her'. Pt's father passed away 5 years ago and since then her relationship with her mother has become more tense. Pt would like to have her own place but feels a lot of guilt about it.     Pt reported that she does not know a lot about mediation/mindfulness and is interested in using it. Pt agreed to use the grounding technique with the frozen [place] and will report back to [facility] at next session.     Plan:  . Pt will use progressive muscle relaxation to help decrease symptoms of anxiety.  . Pt will use an [place] for a grounding technique when she feels overwhelmed.     Next Schedule Session Date:  12/22/2018 at 11am    I have read this encounter note and agree with  therapist's activities and recommendations. - Fredric Mare, PhD, LMFT

## 2018-11-18 ENCOUNTER — Encounter (INDEPENDENT_AMBULATORY_CARE_PROVIDER_SITE_OTHER): Payer: Self-pay | Admitting: Hospital

## 2018-11-21 ENCOUNTER — Encounter (INDEPENDENT_AMBULATORY_CARE_PROVIDER_SITE_OTHER): Payer: Self-pay | Admitting: Hospital

## 2018-11-21 ENCOUNTER — Encounter (INDEPENDENT_AMBULATORY_CARE_PROVIDER_SITE_OTHER): Payer: Self-pay

## 2018-12-13 ENCOUNTER — Encounter (INDEPENDENT_AMBULATORY_CARE_PROVIDER_SITE_OTHER): Payer: Self-pay | Admitting: Hospital

## 2019-01-31 ENCOUNTER — Encounter (INDEPENDENT_AMBULATORY_CARE_PROVIDER_SITE_OTHER): Payer: Self-pay | Admitting: Hospital

## 2019-04-08 ENCOUNTER — Encounter: Payer: Self-pay | Admitting: Hospital

## 2019-04-13 ENCOUNTER — Encounter: Payer: Self-pay | Admitting: Hospital

## 2019-06-28 ENCOUNTER — Encounter (INDEPENDENT_AMBULATORY_CARE_PROVIDER_SITE_OTHER): Payer: Self-pay

## 2020-08-13 ENCOUNTER — Encounter (INDEPENDENT_AMBULATORY_CARE_PROVIDER_SITE_OTHER): Payer: Self-pay | Admitting: Hospital

## 2020-08-13 ENCOUNTER — Other Ambulatory Visit: Payer: Self-pay

## 2020-08-19 DIAGNOSIS — A6000 Herpesviral infection of urogenital system, unspecified: Secondary | ICD-10-CM | POA: Insufficient documentation

## 2020-08-19 NOTE — Goals of Care (Signed)
x

## 2020-08-19 NOTE — Progress Notes (Signed)
Ruth Hall is a 35yo G1P1001 w/hx HPV, former smoker, genital herpes, BMI 36,  and depression with anxiety, who presents for several concerns:    #. Endometriosis - diagnosed by biopsy of  abdominal wall scar (s/p c-section) in 2020. Patient reports that pain has worsened, now lasts for two weeks each month (one full week prior to start of menses, and week of menses, when pain is severe and not controlled by ibuprofen or acetaminophen). Would like to discuss options for management    #. Inability to lose weight - reports exercise daily, eating healthy. This significantly affects her mood and self-regard    #. Stye left lower lid - has been present for months.    I reviewed the patient's medical record, including PMH, SH, medications, allergies, and ROS.    Vitals:    08/20/20 0946   BP: 131/83   BP Location: Left arm   BP Patient Position: Sitting   BP cuff size: Regular   Pulse: 82   Temp: 99.2 F (37.3 C)   TempSrc: Temporal   SpO2: 98%   Weight: 87.1 kg (192 lb)   Height: 5' 0.5" (1.537 m)     Physical Exam: General Appearance: obese, alert, no distress, pleasant affect, cooperative.  Left lower lid: stye    ASSESSMENT/PLAN:    Ruth Hall was seen today for eye problem and other.    Diagnoses and all orders for this visit:    Endometriosis  -     [facility]  -     oxyCODONE-acetaminophen (PERCOCET) 5-325 MG tablet; Take 1 tablet by mouth every 6 hours as needed for Severe Pain (Pain Score 7-10).  -     levonorgestrel-ethinyl estradiol (SEASONIQUE) 0.15-0.03 &0.01 MG tablet; Take 1 tablet by mouth daily.    BMI 37.0-37.9, adult  -     Hepatitis C Antibody (Hep C Ab); Future  -     Lipid Panel Green Plasma Separator Tube; Future  -     Glycosylated Hgb(A1C), Blood Lavender; Future  -     CMP; Future    BMI 36.0-36.9,adult  -     Consult/Referral to Bariatric Surgery & Weight Management  -     TSH, Blood - See Instructions; Future    Hordeolum externum of left lower eyelid  -      Ophthalmology Clinic    Pre-hypertension    Elevated cholesterol with high triglycerides    Other orders  -     CURES Review Documentation - I Reviewed CURES              labs

## 2020-08-20 ENCOUNTER — Encounter (INDEPENDENT_AMBULATORY_CARE_PROVIDER_SITE_OTHER): Payer: Self-pay

## 2020-08-20 ENCOUNTER — Encounter (INDEPENDENT_AMBULATORY_CARE_PROVIDER_SITE_OTHER): Payer: Self-pay | Admitting: Family Medicine

## 2020-08-20 ENCOUNTER — Other Ambulatory Visit: Payer: HMO | Attending: Family Medicine

## 2020-08-20 ENCOUNTER — Encounter (INDEPENDENT_AMBULATORY_CARE_PROVIDER_SITE_OTHER): Payer: Self-pay | Admitting: Hospital

## 2020-08-20 ENCOUNTER — Telehealth (INDEPENDENT_AMBULATORY_CARE_PROVIDER_SITE_OTHER): Payer: Self-pay | Admitting: Family Medicine

## 2020-08-20 ENCOUNTER — Ambulatory Visit (INDEPENDENT_AMBULATORY_CARE_PROVIDER_SITE_OTHER): Payer: HMO | Admitting: Family Medicine

## 2020-08-20 VITALS — BP 131/83 | HR 82 | Temp 99.2°F | Ht 60.5 in | Wt 192.0 lb

## 2020-08-20 DIAGNOSIS — H00015 Hordeolum externum left lower eyelid: Secondary | ICD-10-CM

## 2020-08-20 DIAGNOSIS — Z6836 Body mass index (BMI) 36.0-36.9, adult: Secondary | ICD-10-CM | POA: Insufficient documentation

## 2020-08-20 DIAGNOSIS — R030 Elevated blood-pressure reading, without diagnosis of hypertension: Secondary | ICD-10-CM

## 2020-08-20 DIAGNOSIS — E782 Mixed hyperlipidemia: Secondary | ICD-10-CM

## 2020-08-20 DIAGNOSIS — Z6837 Body mass index (BMI) 37.0-37.9, adult: Secondary | ICD-10-CM

## 2020-08-20 DIAGNOSIS — Z723 Lack of physical exercise: Secondary | ICD-10-CM | POA: Insufficient documentation

## 2020-08-20 DIAGNOSIS — N809 Endometriosis, unspecified: Secondary | ICD-10-CM

## 2020-08-20 LAB — COMPREHENSIVE METABOLIC PANEL, BLOOD
ALT (SGPT): 23 U/L (ref 0–33)
AST (SGOT): 18 U/L (ref 0–32)
Albumin: 4.4 g/dL (ref 3.5–5.2)
Alkaline Phos: 73 U/L (ref 35–104)
Anion Gap: 11 mmol/L (ref 7–15)
BUN: 12 mg/dL (ref 6–20)
Bicarbonate: 25 mmol/L (ref 22–29)
Bilirubin, Tot: 0.25 mg/dL (ref <1.2)
Calcium: 9.8 mg/dL (ref 8.5–10.6)
Chloride: 106 mmol/L (ref 98–107)
Creatinine: 0.82 mg/dL (ref 0.51–0.95)
GFR: >60 mL/min
Glucose: 102 mg/dL — ABNORMAL HIGH (ref 70–99)
Potassium: 4.6 mmol/L (ref 3.5–5.1)
Sodium: 142 mmol/L (ref 136–145)
Total Protein: 8 g/dL (ref 6.0–8.0)
eGFR Based on CKD-EPI 2021 Equation: >60 mL/min

## 2020-08-20 LAB — LIPID(CHOL FRACT) PANEL, BLOOD
Cholesterol: 206 mg/dL (ref <200)
HDL-Cholesterol: 35 mg/dL
LDL-Chol (Calc): INVALID mg/dL (ref <160)
Non-HDL Cholesterol: 171 mg/dL
Triglycerides: 600 mg/dL — ABNORMAL HIGH (ref 10–170)

## 2020-08-20 LAB — TSH, BLOOD: TSH: 1.13 u[IU]/mL (ref 0.27–4.20)

## 2020-08-20 LAB — HEPATITIS C AB, BLOOD: Hepatitis C Ab: NONREACTIVE

## 2020-08-20 LAB — GLYCOSYLATED HGB(A1C), BLOOD: Glyco Hgb (A1C): 5.6 % (ref 4.8–5.8)

## 2020-08-20 MED ORDER — LEVONORGEST-ETH ESTRAD 91-DAY 0.15-0.03 &0.01 MG OR TABS
1.0000 | ORAL_TABLET | Freq: Every day | ORAL | 3 refills | Status: DC
Start: 2020-08-20 — End: 2020-10-24

## 2020-08-20 MED ORDER — OXYCODONE-ACETAMINOPHEN 5-325 MG OR TABS
1.0000 | ORAL_TABLET | Freq: Four times a day (QID) | ORAL | 0 refills | Status: DC | PRN
Start: 2020-08-20 — End: 2020-10-24

## 2020-08-20 NOTE — Telephone Encounter (Signed)
Please call patient and schedule repeat lab test. She should plan to fast 8-10 hours (overnight) prior to morning blood draw. Ty   Per Dr Chyrl Civatte pt no answer, vmail full unable to leave msg my chart sent to contact office for appt.    Florene Route- MA

## 2020-08-20 NOTE — Telephone Encounter (Signed)
Spoke with patient by phone regarding lab test results, elevated triglyceride level.  She last ate a bowl of cereal at 8pm prior to the morning blood draw.  We will repeat lab again to confirm level.  Nutrition consult placed earlier today.  All questions answered.

## 2020-08-20 NOTE — Interdisciplinary (Signed)
Blood drawn from left arm with 23 gauge needle. 3 tubes taken.   Patient identity authenticated by Susana Portin Francisco.

## 2020-08-21 ENCOUNTER — Encounter (INDEPENDENT_AMBULATORY_CARE_PROVIDER_SITE_OTHER): Payer: Self-pay

## 2020-08-21 NOTE — Telephone Encounter (Signed)
Call Center if pt calls back and wants a consultation after reviewing MyChart message  - please schedule an Obesity Medicine Consultation with Dr. K.      If pt wants to schedule SLIM let pt know the current class is filled and next class will be in July.  If pt is still interesred route message to CIM Navigator Pool and if pt has more questions they can call [phone].    If patient wants to join any of the other programs please route back to me.

## 2020-08-30 ENCOUNTER — Encounter (INDEPENDENT_AMBULATORY_CARE_PROVIDER_SITE_OTHER): Payer: Self-pay | Admitting: Family Medicine

## 2020-08-30 ENCOUNTER — Other Ambulatory Visit: Payer: HMO | Attending: Family Medicine

## 2020-08-30 DIAGNOSIS — E782 Mixed hyperlipidemia: Secondary | ICD-10-CM | POA: Insufficient documentation

## 2020-08-30 LAB — LIPID(CHOL FRACT) PANEL, BLOOD
Cholesterol: 159 mg/dL (ref <200)
HDL-Cholesterol: 45 mg/dL
LDL-Chol (Calc): 83 mg/dL (ref <160)
Non-HDL Cholesterol: 114 mg/dL
Triglycerides: 156 mg/dL (ref 10–170)

## 2020-08-30 NOTE — Interdisciplinary (Signed)
Blood drawn from right arm with 21 gauge needle. 1 tubes taken.   Patient identity authenticated by [name].

## 2020-09-12 ENCOUNTER — Telehealth (HOSPITAL_BASED_OUTPATIENT_CLINIC_OR_DEPARTMENT_OTHER): Payer: Self-pay

## 2020-09-12 NOTE — Telephone Encounter (Signed)
NEW MOS REFERRAL    1. Was the patient just registered by you? no    2. What is the reason patient needs to be seen? Endometriosis    3. Does this referral require an auth?  no      A.) if so was [place] entered? n/a    4. Which folder in the fax server was the referral placed in?  Referral on file per PCP, Dr Luvenia Heller    Pt was informed it can take up to 7 days for review process.     Please route response to [facility] Scheduling

## 2020-09-17 NOTE — Telephone Encounter (Signed)
Pt calling to see if referral had been review, advised that it is still in process.

## 2020-09-18 NOTE — Telephone Encounter (Signed)
GYN referral. Records reviewed. Please schedule with R3-R4, Dr. Caryl Comes or Dr. Luz Lex.     For: endometriosis.    Please check for authorization on services needed.

## 2020-09-30 ENCOUNTER — Other Ambulatory Visit: Payer: Self-pay

## 2020-10-01 DIAGNOSIS — H00016 Hordeolum externum left eye, unspecified eyelid: Secondary | ICD-10-CM

## 2020-10-01 HISTORY — DX: Hordeolum externum left eye, unspecified eyelid: H00.016

## 2020-10-01 NOTE — Assessment & Plan Note (Addendum)
By hx, appears c/w chalazion   wc and lid hygiene

## 2020-10-02 ENCOUNTER — Ambulatory Visit (INDEPENDENT_AMBULATORY_CARE_PROVIDER_SITE_OTHER): Payer: HMO | Admitting: Ophthalmology

## 2020-10-02 DIAGNOSIS — H0015 Chalazion left lower eyelid: Secondary | ICD-10-CM | POA: Insufficient documentation

## 2020-10-02 DIAGNOSIS — H01023 Squamous blepharitis right eye, unspecified eyelid: Secondary | ICD-10-CM | POA: Insufficient documentation

## 2020-10-02 DIAGNOSIS — H00016 Hordeolum externum left eye, unspecified eyelid: Secondary | ICD-10-CM

## 2020-10-02 DIAGNOSIS — H01026 Squamous blepharitis left eye, unspecified eyelid: Secondary | ICD-10-CM

## 2020-10-02 HISTORY — DX: Squamous blepharitis right eye, unspecified eyelid: H01.023

## 2020-10-02 HISTORY — DX: Chalazion left lower eyelid: H00.15

## 2020-10-02 NOTE — Assessment & Plan Note (Signed)
wc and lid hygiend 3x/day for 2 minutes  Follow-up 2-3 months  T/c ocp eval

## 2020-10-02 NOTE — Progress Notes (Signed)
Hordeolum of left eye  By hx, appears c/w chalazion   wc and lid hygiene    Squamous blepharitis of both eyes  Blepharitis OU - lid hygiene bid, warm compresses tid, artifical tears qid ou  Instructions given         Chalazion left lower eyelid  wc and lid hygiend 3x/day for 2 minutes  Follow-up 2-3 months  T/c ocp eval        I have seen and and personally examined the patient. I agree with the note and plan as I have modified above.

## 2020-10-02 NOTE — Assessment & Plan Note (Signed)
Blepharitis OU - lid hygiene bid, warm compresses tid, artifical tears qid ou  Instructions given

## 2020-10-03 ENCOUNTER — Ambulatory Visit (INDEPENDENT_AMBULATORY_CARE_PROVIDER_SITE_OTHER): Payer: HMO | Admitting: Endocrinology

## 2020-10-03 ENCOUNTER — Encounter (INDEPENDENT_AMBULATORY_CARE_PROVIDER_SITE_OTHER): Payer: Self-pay | Admitting: Endocrinology

## 2020-10-03 VITALS — BP 115/71 | HR 75 | Temp 98.0°F | Resp 16 | Ht 60.15 in | Wt 193.9 lb

## 2020-10-03 DIAGNOSIS — E669 Obesity, unspecified: Secondary | ICD-10-CM

## 2020-10-03 DIAGNOSIS — Z6837 Body mass index (BMI) 37.0-37.9, adult: Secondary | ICD-10-CM

## 2020-10-03 MED ORDER — PHENTERMINE HCL 15 MG OR CAPS
15.0000 mg | ORAL_CAPSULE | Freq: Every morning | ORAL | 2 refills | Status: DC
Start: 2020-10-03 — End: 2021-01-09

## 2020-10-03 MED ORDER — TOPIRAMATE 25 MG OR TABS
25.0000 mg | ORAL_TABLET | Freq: Every day | ORAL | 2 refills | Status: DC
Start: 2020-10-03 — End: 2021-02-04

## 2020-10-03 NOTE — Progress Notes (Signed)
Subjective:   Ruth Hall is a 35 year old adult who is here for New Patient      HPI    [facility] Consultation                                                                                                                                                      Referring physician/PCP: Ruth Hall, Ruth Hall is a 35 year old adult here for a consultation regarding weight management. Primary motivation for weight reduction is to reduce health risks and comorbidities, be more mobile, and improve quality of life.     Weight history  Has always been struggling with her weight since age 46.   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   Lives with her mom, partner, 62 yo son.  Pt does most of the cooking at home. Eats out once a week.   Tends to snack. Craves soda, chips, cookies.  Recall  B: no  L: brings lunch: sandwich, salad   D: chicken and rice or pasta  Does not eat a lot of vegetables.   Snack: chips, cookies  Regular soda 3-4 cans a day  ETOH: occasionally once a month    Exercise: hiking once week  Trainer 3/week.      Not planning another pregnancy.   Takes oxy prn for endometriosis pain.       History of seizures: no  History of valvular or ischemic heart disease: no  History of glaucoma: no  History of hyperthyroidism: no  History of nephrolithiasis: no  History of significant psychiatric illness: no  History of pancreatitis or other pancreatic disease: no  Personal or family history of thyroid cancer or MEN: no       Past Medical History:   Diagnosis Date    HPV in female     Migraine     Obesity        Past Surgical History:   Procedure  Laterality Date    CHOLECYSTECTOMY, LAP  2006    c-section  2005         Patient Active Problem List   Diagnosis    ASCUS (atypical squamous cells of undetermined significance) on Pap smear    BMI 37.0-37.9, adult    Anxiety    Herpes simplex infection of genitourinary system    Inadequate exercise - not at goal    Hordeolum  of left eye    Squamous blepharitis of both eyes    Chalazion left lower eyelid       Social History     Socioeconomic History    Marital status: Single     Spouse name: Not on file    Number of children: Not on file    Years of education: Not on file    Highest education level: High school graduate   Occupational History    Occupation: receptionist   Tobacco Use    Smoking status: Former Smoker    Smokeless tobacco: Never Used   Substance and Sexual Activity    Alcohol use: Yes     Alcohol/week: 5.0 - 7.5 standard drinks     Types: 2 - 3 Shots of liquor per week    Drug use: No    Sexual activity: Yes     Partners: Male     Birth control/protection: Pill     Comment: Ortho-Evra patch   Other Topics Concern    Not on file   Social History Narrative    2019 - Lives with mom, 66yo son, and boyfriend.    Cats, dogs, [place] pig, fish     Social Determinants of Health     Financial Resource Strain: Low Risk     Difficulty of Paying Living Expenses: Not very hard   Food Insecurity: No Food Insecurity    Worried About Charity fundraiser in the Last Year: Never true    Ran Out of Food in the Last Year: Never true   Transportation Needs: No Transportation Needs    Lack of Transportation (Medical): No    Lack of Transportation (Non-Medical): No   Physical Activity: Sufficiently Active    Days of Exercise per Week: 3 days    Minutes of Exercise per Session: 50 min   Stress: Not on file   Social Connections: Moderately Isolated    Frequency of Communication with Friends and Family: Three times a week    Frequency of Social Gatherings with Friends and Family: Never    Attends  Religious Services: Never    Marine scientist or Organizations: No    Attends Music therapist: Never    Marital Status: Living with partner   Intimate Partner Violence: Not on file   Housing Stability: Low Risk     Unable to Pay for Housing in the Last Year: No    Number of Places Lived in the Last Year: 1    Unstable Housing in the Last Year: No         Family History   Problem Relation Name Age of Onset    Cancer Maternal Grandmother          GI cancer, also in 2 aunts    No Known Problems Mother      Cancer Father  32        lymphoma    Diabetes Neg Hx      Heart Disease Neg Hx      Stroke Neg Hx      Psychiatry Neg Hx      Hypertension Neg Hx      Thyroid Neg Hx      No Known Cancer Neg Hx      No Known Diabetes Neg Hx      Other Neg Hx      No Known Heart Disease Neg Hx           Review  of Systems   Constitutional: Negative for chills and fever.   Respiratory: Negative for cough and shortness of breath.    Cardiovascular: Negative for chest pain and palpitations.   Gastrointestinal: Negative for constipation.        Frequent BMs 3-4 times a day   Genitourinary: Negative for menstrual problem.   Musculoskeletal: Positive for arthralgias.        Knee pain   Psychiatric/Behavioral: Negative for sleep disturbance.    remainder ROS neg    Current Outpatient Medications   Medication Sig Dispense Refill    levonorgestrel-ethinyl estradiol (SEASONIQUE) 0.15-0.03 &0.01 MG tablet Take 1 tablet by mouth daily. 90 tablet 3    oxyCODONE-acetaminophen (PERCOCET) 5-325 MG tablet Take 1 tablet by mouth every 6 hours as needed for Severe Pain (Pain Score 7-10). 10 tablet 0    phentermine 15 MG capsule Take 1 capsule (15 mg) by mouth every morning. 30 capsule 2    topiramate (TOPAMAX) 25 MG tablet Take 1 tablet (25 mg) by mouth daily. Every evening 30 tablet 2     No current facility-administered medications for this visit.     Allergies   Allergen Reactions    Penicillins Hives        Reviewed patients pertinent information related to social history, past medical, past surgical, and family history.     Objective:  Vital signs: BP 115/71 (BP Location: Right arm, BP Patient Position: Sitting, BP cuff size: Large)    Pulse 75    Temp 98 F (36.7 C) (Temporal)    Resp 16    Ht 5' 0.15" (1.528 m)    Wt 88 kg (193 lb 14.4 oz)    SpO2 98%    BMI 37.67 kg/m     Physical Exam  Constitutional:       General: She is not in acute distress.  Cardiovascular:      Rate and Rhythm: Normal rate and regular rhythm.      Heart sounds: No murmur heard.    No gallop.   Pulmonary:      Effort: Pulmonary effort is normal. No respiratory distress.      Breath sounds: Normal breath sounds. No wheezing.   Abdominal:      General: There is no distension.      Tenderness: There is no abdominal tenderness.   Musculoskeletal:      Right lower leg: No edema.      Left lower leg: No edema.   Neurological:      Mental Status: She is alert and oriented to person, place, and time.   Psychiatric:         Mood and Affect: Mood normal.         Behavior: Behavior normal.           Lab Results   Component Value Date    A1C 5.6 08/20/2020    A1C 5.3 09/23/2017    A1C 5.3 11/29/2014        Lab Results   Component Value Date    TSH 1.13 08/20/2020       Lab Results   Component Value Date    CHOL 159 08/30/2020    HDL 45 08/30/2020    LDLCALC 83 08/30/2020    TRIG 156 08/30/2020        Lab Results   Component Value Date    WBC 5.4 03/12/2006    RBC 4.83 03/12/2006    HGB 14.2 03/12/2006    HCT  42.2 03/12/2006    MCV 87.5 03/12/2006    MCHC 33.6 03/12/2006    RDW 12.5 03/12/2006    PLT 264 03/12/2006    MPV Positive 07/06/2011       Lab Results   Component Value Date    BUN 12 08/20/2020    CREAT 0.82 08/20/2020    CL 106 08/20/2020    NA 142 08/20/2020    K 4.6 08/20/2020    CA 9.8 08/20/2020    TBILI 0.25 08/20/2020    ALB 4.4 08/20/2020    TP 8.0 08/20/2020    AST 18 08/20/2020    ALK 73 08/20/2020    BICARB 25 08/20/2020    ALT  23 08/20/2020    GLU 102 (H) 08/20/2020        No results found for: Louanne Skye, VD2, VD3, VDT     Assessment/Plan:  Ruth Hall was seen today for new patient.    Diagnoses and all orders for this visit:    Class 2 obesity without serious comorbidity with body mass index (BMI) of 37.0 to 37.9 in adult, unspecified obesity type  -     phentermine 15 MG capsule; Take 1 capsule (15 mg) by mouth every morning.  -     topiramate (TOPAMAX) 25 MG tablet; Take 1 tablet (25 mg) by mouth daily. Every evening    Other orders  -     CURES Review Documentation - I Reviewed CURES    35 yo lady obesity class 2 with knee pain, no other comorbid conditions  Referred for consideration of weight management options as part of treatment for these medical conditions and for prevention of further obesity-related metabolic complications.   We have reviewed the higher risk of multiple conditions that are associated with obesity, as well as counseled on the metabolic and other benefits associated with weight loss.?   ?   We had a lengthy discussion regarding the behavioral, genetic, physiologic, hormonal, and metabolic etiologies of weight gain and weight regulation.        We discussed many options currently available to help with weight loss  -We have to develop a plan for healthier food choices and caloric restriction.   ?-We also discussed importance of regular exercise.   ?  We discussed the use of weight loss medications, both FDA approved and used off-label. We will do a trial of phen/top. We reviewed side effects, how to take the medication, that longer term use may be needed for continued efficacy, and also a plan for discontinuation if not effective, defined as <5% loss in 3-6 mos.   ?   Pt does not meet NIH criteria for bariatric surgery. ?   ?  Finally, I had a very clear discussion with the patient that our focus for weight management should be improving comorbidities, lifestyle, and quality of life    We will  continue follow up and work together on goal setting and lifestyle modification strategies.      RTC 3 months    I personally spent total 45 minutes in face-to-face and non-face-to-face activities related to the patients visit today, excluding any separately reportable services/procedures    Sherian Rein, MD     Weight Management Program

## 2020-10-22 ENCOUNTER — Other Ambulatory Visit: Payer: Self-pay

## 2020-10-24 ENCOUNTER — Other Ambulatory Visit
Payer: HMO | Attending: Student in an Organized Health Care Education/Training Program | Admitting: Student in an Organized Health Care Education/Training Program

## 2020-10-24 ENCOUNTER — Encounter (INDEPENDENT_AMBULATORY_CARE_PROVIDER_SITE_OTHER): Payer: Self-pay

## 2020-10-24 ENCOUNTER — Encounter (INDEPENDENT_AMBULATORY_CARE_PROVIDER_SITE_OTHER): Payer: Self-pay | Admitting: Student in an Organized Health Care Education/Training Program

## 2020-10-24 VITALS — BP 127/88 | HR 98 | Temp 98.7°F | Resp 18 | Ht 60.15 in | Wt 186.0 lb

## 2020-10-24 DIAGNOSIS — Z87410 Personal history of cervical dysplasia: Secondary | ICD-10-CM | POA: Insufficient documentation

## 2020-10-24 DIAGNOSIS — Z124 Encounter for screening for malignant neoplasm of cervix: Secondary | ICD-10-CM | POA: Insufficient documentation

## 2020-10-24 DIAGNOSIS — N806 Endometriosis in cutaneous scar: Secondary | ICD-10-CM

## 2020-10-24 NOTE — Progress Notes (Signed)
GYN Problem Visit  Referral indication: endometriosis  Referring provider: Dr. Betha Loa     Subjective:    HPI 35 year old G1P1001 presents for pain in known scar endometriosis. Patient's last menstrual period was 10/14/2020 (exact date).   After her c-section in 2005 she had a growth on her scar. She thought it was a keloid. She was later told it was a cyst. This year it started hurting one week before, during, and after menstruation. 06/2018 biopsy done at [facility] c/w endometriosis. The endometriosis implant enlarges during menstruation.    No imaging of her abdominal wall or pelvis.    Cycle: regular, 8-9 days, every 28 days. She uses up to 8 tampons per day. She can soak thorugh a tampon in 1 hour but no diagnosis of anemia. Denies dysmenorrhea.    Denies dyspareunia.    Not using anything for contraception. She does not use any other form of contraception. She does not desire any further children. No surgical sterilization.   She does not want to be on contraception.     No aura with her migraines but + history of migraines.    She does have a history of abnormal pap smears. Highest grade SIL cannot determine grade, possible HSIL 2013. Colposcopy done 2013.    She is extremely anxious regarding pap smears and pelvic exams.     ROS  Review of Systems  Constitutional: Negative  Eyes: Negative  ENT: Negative  Cardiac: Negative  Pulmonary: Negative  Gastrointestional: Negative  Musculoskeletal: Negative  Skin: Unexplained Rash  Neurologic: Negative  Psychiatric: Negative  Endocrine: Negative  Blood Disease: Negative  Allergy: Negative  OB/Gyn: Pelvic pain    OB  OB History   Gravida Para Term Preterm AB Living   '1 1 1 '$ 0 0 1   SAB IAB Ectopic Multiple Live Births   0 0 0 0 1      # Outcome Date GA Lbr Len/2nd Weight Sex Delivery Anes PTL Lv   1 Term 10/29/03 [id number]  M CESAREAN SEC   LIV     GYN  Gyn History     LMP: 10/14/2020 (Exact Date), Having periods    Age at Menarche:     Age at First  Pregnancy:     Age at Menopause:     Gyn History Comments:     Sexual Activity: Yes; Female; OAllean Foundpatch    Contraception: Pill      05/05/10 Pap:  ASCUS, HR HPV positive  [facility]  [facility] benign  12/23/10 Pap:  LSIL  01/09/11:  Pap (repeat) LSIL  01/09/11 colpo:  Benign endocervix with chronic inflammation  07/06/11 Pap:  'SIL, grade cannot be determined.'  HPV positive.  Comment: Previous cytology reviewed. Many LSIL cells and some smaller cells are seen worrisome for higher grade lesion.   09/11/11 colpo:  CIN-1, ECC negative  03/21/14: [facility] 2019 pap NCerulean HPV negative     PMH/PSH  Past Medical History:   Diagnosis Date    HPV in female     Migraine     Obesity      Past Surgical History:   Procedure Laterality Date    CHOLECYSTECTOMY, LAP  2006    c-section  2005     Meds  Current Outpatient Medications   Medication Sig Dispense Refill    phentermine 15 MG capsule Take 1 capsule (15 mg) by mouth every morning. 30 capsule 2  topiramate (TOPAMAX) 25 MG tablet Take 1 tablet (25 mg) by mouth daily. Every evening 30 tablet 2     No current facility-administered medications for this visit.     Allergies  Allergies   Allergen Reactions    Penicillins Hives     Social History     Social History     Socioeconomic History    Marital status: Single    Highest education level: High school graduate   Occupational History    Occupation: receptionist   Tobacco Use    Smoking status: Former Smoker    Smokeless tobacco: Never Used   Substance and Sexual Activity    Alcohol use: Yes     Alcohol/week: 5.0 - 7.5 standard drinks     Types: 2 - 3 Shots of liquor per week    Drug use: No    Sexual activity: Yes     Partners: Male     Birth control/protection: Pill     Comment: Ortho-Evra patch   Social History Narrative    2019 - Lives with mom, 83yo son, and boyfriend.    Cats, dogs, [place] pig, fish     Family History  Family History   Problem Relation Name Age of Onset    Cancer  Maternal Grandmother          GI cancer, also in 2 aunts    No Known Problems Mother      Cancer Father  56        lymphoma    Diabetes Neg Hx      Heart Disease Neg Hx      Stroke Neg Hx      Psychiatry Neg Hx      Hypertension Neg Hx      Thyroid Neg Hx      No Known Cancer Neg Hx      No Known Diabetes Neg Hx      Other Neg Hx      No Known Heart Disease Neg Hx         Objective:    BP 127/88 (BP Location: Left arm, BP Patient Position: Sitting, BP cuff size: Large)    Pulse 98    Temp 98.7 F (37.1 C) (Temporal)    Resp 18    Ht 5' 0.15" (1.528 m)    Wt 84.4 kg (186 lb)    LMP 10/14/2020 (Exact Date)    SpO2 98%    BMI 36.14 kg/m   Gen No acute distress  Abd Soft. 2 x 2 cm nodule right of midline in the cesarean section scar. Feels to be 2-3 cm in depth. Purple discoloration.   Pelvic  External genitalia: No masses/lesions, well-estrogenized  Urethral meatus: Normal position, without prolapse  Bladder: Non-tender  Vagina: No erythema, no abnormal discharge, good support  Cervix: No evidence of cervicitis/masses. No cervical motion tenderness  Uterus 8-week size, mobile, anteverted, nontender  Adnexa Not palpable    Assessment/Plan:    35 year old G1P1001 here for discussion of management options for scar endometriosis.     Advised that scar endometriosis is unlikely to spontaneously resolve before menopause. Biopsy proven endometriosis. She has no other symptoms suggestive of symptomatic endometriosis in the pelvis. She denies dysmenorrhea or dyspareunia. She does have tenderness in the scar endometriosis one week before, during and after menses. We discussed medical management options, including ovulation suppression with COCs, birth control patch, nuva ring, depo provera. More severe cases can also be managed with  suppression of menstrual hormones with depo lupron or a GnRH antagonist. We can also surgically excise the endometriosis in the OR. I recommend a CT scan to assess the size and depth of  the endometriosis. However, I think the implant is small and above the fascia based on exam. I also advised on recurrence risk especially if she declines to start medication for ovulation suppression after surgery. She declined a pelvic US to assess for pelvic endometriosis but we can assess the pelvis on CT scan. She is leaning towards surgical therapy.    History of abnormal pap. 2013 pap possible HSIL cells. Recommend paps space no more than every 3 years. Done today with HPV. Patient is very anxious regarding pelvic exams but was able to get through a pap exam today.     #. Return to clinic pending CT scan result.    Gaspar Garbe, MD

## 2020-10-24 NOTE — Patient Instructions (Signed)
Dear Ruth Hall,    Please call radiology to schedule the CT scan of the abdomen and pelvis.     I will contact you with the pap and HPV test result.     Sincerely,  Dr. Elveria Rising

## 2020-10-28 ENCOUNTER — Other Ambulatory Visit: Payer: Self-pay

## 2020-10-29 ENCOUNTER — Encounter (INDEPENDENT_AMBULATORY_CARE_PROVIDER_SITE_OTHER): Payer: Self-pay | Admitting: Student in an Organized Health Care Education/Training Program

## 2020-10-29 ENCOUNTER — Ambulatory Visit
Admission: RE | Admit: 2020-10-29 | Discharge: 2020-10-29 | Disposition: A | Discharge status: Home / Self Care | Payer: HMO | Attending: Student in an Organized Health Care Education/Training Program | Admitting: Student in an Organized Health Care Education/Training Program

## 2020-10-29 DIAGNOSIS — N806 Endometriosis in cutaneous scar: Secondary | ICD-10-CM | POA: Insufficient documentation

## 2020-10-29 DIAGNOSIS — N750 Cyst of Bartholin's gland: Secondary | ICD-10-CM

## 2020-10-29 DIAGNOSIS — Z01818 Encounter for other preprocedural examination: Secondary | ICD-10-CM

## 2020-10-29 MED ORDER — IOHEXOL 350 MG/ML IV SOLN
100.0000 mL | Freq: Once | INTRAVENOUS | Status: AC
Start: 2020-10-29 — End: 2020-10-29
  Administered 2020-10-29 (×2): 100 mL via INTRAVENOUS
  Filled 2020-10-29: qty 100

## 2020-10-30 ENCOUNTER — Telehealth (INDEPENDENT_AMBULATORY_CARE_PROVIDER_SITE_OTHER): Payer: Self-pay | Admitting: Family Medicine

## 2020-10-30 ENCOUNTER — Encounter (INDEPENDENT_AMBULATORY_CARE_PROVIDER_SITE_OTHER): Payer: Self-pay

## 2020-10-30 ENCOUNTER — Encounter (INDEPENDENT_AMBULATORY_CARE_PROVIDER_SITE_OTHER): Payer: Self-pay | Admitting: Student in an Organized Health Care Education/Training Program

## 2020-10-30 DIAGNOSIS — N806 Endometriosis in cutaneous scar: Secondary | ICD-10-CM

## 2020-10-30 LAB — HPV HIGH RISK DNA PROBE, FEMALE
HPV High Risk Genotype 16: NOT DETECTED
HPV High Risk Genotype 18: NOT DETECTED
HPV Other High Risk Genotypes (Not type 16 or 18): NOT DETECTED

## 2020-10-30 NOTE — Telephone Encounter (Signed)
CALL BACK    Pt calling back for nurse/pcp for  CT results.    Action required by office: Plesae contact caller     Best way to contact:     Encounter created by Care Assist MA.  If further action required please route encounter to appropriate in clinic MA/LVN/Resident Pool

## 2020-10-30 NOTE — Progress Notes (Signed)
Called to review CT scan. 16 x 13 mm soft tissue nodule to the right of midline and at the level of the c-section scar. Small uterine fibroid but no other uterine or adnexal anomalies. Reassured patient that reported liver hemangioma is small 25 mm and are common- occurring in up to 20% of the population. Small air cyst noted in LLL. From my reading usually no treatment needed for a small lung cyst.     Although, the endometriosis in her scar appears to be small, these can often be more invasive than anticipated. If some of the fascia needs to be excised the abdominal wall closure can be more complicated. Recommend general surgery referral to evaluate her case and see if a combined case should be done or if they wish to take over her care as the primary surgeon. We again reviewed medical management options including COCs, depo provera. Depo lupron would not be a long term treatment strategy.     All questions answered.     Gaspar Garbe[place] MD  PID [id number]  Obstetrics and Gynecology

## 2020-10-30 NOTE — Telephone Encounter (Signed)
Results in chart. Pls advise    Octavie Westerhold- MA

## 2020-10-30 NOTE — Telephone Encounter (Signed)
Mother Ellis Parents is calling requesting for MA or PCP to call pt to go over results. Please assist.

## 2020-10-30 NOTE — Telephone Encounter (Signed)
Called and spoke to [place], she isaware Dr Luvenia Heller has not reviewed results and will call her once she has. Usually will take 24-72 hours for call back.  Florene Route- MA

## 2020-10-31 ENCOUNTER — Telehealth (HOSPITAL_BASED_OUTPATIENT_CLINIC_OR_DEPARTMENT_OTHER): Payer: Self-pay

## 2020-10-31 ENCOUNTER — Encounter (INDEPENDENT_AMBULATORY_CARE_PROVIDER_SITE_OTHER): Payer: Self-pay | Admitting: Endocrinology

## 2020-10-31 NOTE — Telephone Encounter (Signed)
WQ Referral received from [place], [place] Benjaman Lobe, MD     DX: scar endometriosis in a previous Pfannenstiel incision, CT scan done 09/2020. Suspect superficial but these are often deeper than expected and may require fascial resection and more complex abdominal wall closure.     Please review and advise on scheduling. Thank you.

## 2020-11-01 ENCOUNTER — Encounter (INDEPENDENT_AMBULATORY_CARE_PROVIDER_SITE_OTHER): Payer: Self-pay | Admitting: Family Medicine

## 2020-11-01 NOTE — Telephone Encounter (Signed)
Pt mother is calling back in regards to schedule pt appt [id number] endometriosis in a previous Pfannenstiel incision, CT scan done 09/2020. Suspect superficial but these are often deeper than expected and may require fascial resection and more complex abdominal wall closure.       Please advise Mother Ellis Parents [phone]

## 2020-11-01 NOTE — Telephone Encounter (Signed)
Attempted to reach patient, left message on voicemail.  Dr. Elveria Rising has note in chart; she discussed CT results with patient.  Please let me know if you have additional questions.  MyChart message sent.

## 2020-11-04 ENCOUNTER — Telehealth (INDEPENDENT_AMBULATORY_CARE_PROVIDER_SITE_OTHER): Payer: Self-pay | Admitting: Family Medicine

## 2020-11-04 NOTE — Telephone Encounter (Signed)
A 16 x 13 mm superficial soft tissue nodule   Referral reviewed by RN     General surgery requirement is 5 cm or larger.

## 2020-11-04 NOTE — Telephone Encounter (Signed)
General Inquiry     Who is calling: Incoming call from patient    Reason for this call: Pt requesting call back to discuss referral to General Surgery.  Pt states Dr. Gaspar Garbe with [facility] referred pt but was advised referral is in corrrect and needs to be referred to different specialist. Pt states General Surgery was not very clear as they advised her to contact PCP for further directive    Action required by office: Plesae contact caller     Duplicate encounter? No previous documentation found on this issue.     Best way to contact: [phone]  Alternative:     Inquiry has been read verbatim to this caller. Verbalizes satisfaction and confirms the above is accurate: yes    Has been advised this message will be transmitted to office and can expect a response within the next 24-72 hours.    Encounter created by Care Assist MA.  If further action required please route encounter to appropriate in clinic MA/LVN/Resident Pool

## 2020-11-04 NOTE — Telephone Encounter (Addendum)
Pt is to be seen by Gen Surgery - which is where the pt has been referred.

## 2020-11-06 ENCOUNTER — Telehealth (HOSPITAL_BASED_OUTPATIENT_CLINIC_OR_DEPARTMENT_OTHER): Payer: Self-pay

## 2020-11-06 NOTE — Telephone Encounter (Addendum)
Caller: Dr. Ignacia Marvel  Relationship to patient: PCP  Phone # [phone]  Preferred Method of Communication: phone call  Notes: Please review referral and advise on proper scheduling for "scar endometriosis in a previous Pfannenstiel incision, CT scan done 09/2020. Suspect superficial but these are often deeper than expected and may require fascial resection and more complex abdominal wall closure." Please advise    Please assist    Caller has been advised of 48 hr turnaround time.

## 2020-11-06 NOTE — Telephone Encounter (Signed)
Left message on voicemail. Dr. Elveria Rising placed an order for a General Surgery consultation, but apparently there is a problem with the order(?) -- I recommend patient reach out to Dr. Elveria Rising for clarification. I will also send a Secure Message to Dr. Elveria Rising, letting her know that you called our office.

## 2020-11-07 NOTE — Telephone Encounter (Signed)
PER MON TRAUMA:    Hazle Quant, RN  Lebron Quam; Mon Surg General Triage; Utc Dd Mis Triage 19 hours ago (3:07 PM)     ME    Ok to schedule   thanks    Message text

## 2020-11-21 ENCOUNTER — Telehealth (HOSPITAL_BASED_OUTPATIENT_CLINIC_OR_DEPARTMENT_OTHER): Payer: Self-pay | Admitting: Surgery

## 2020-11-21 NOTE — Telephone Encounter (Signed)
Call to patient as a reminder about tomorrow appointment 8/26 with Gen Surg.       Confirmed appointment with patient

## 2020-11-22 ENCOUNTER — Ambulatory Visit: Payer: HMO | Attending: Surgery | Admitting: Surgery

## 2020-11-22 ENCOUNTER — Encounter (HOSPITAL_BASED_OUTPATIENT_CLINIC_OR_DEPARTMENT_OTHER): Payer: Self-pay | Admitting: Surgery

## 2020-11-22 ENCOUNTER — Encounter (HOSPITAL_BASED_OUTPATIENT_CLINIC_OR_DEPARTMENT_OTHER): Payer: Self-pay | Admitting: Hospital

## 2020-11-22 VITALS — BP 129/87 | HR 101 | Temp 98.4°F | Resp 18 | Ht 60.15 in | Wt 186.1 lb

## 2020-11-22 DIAGNOSIS — R1900 Intra-abdominal and pelvic swelling, mass and lump, unspecified site: Secondary | ICD-10-CM | POA: Insufficient documentation

## 2020-11-22 NOTE — Progress Notes (Signed)
Ruth Hall     [id number]       Hurley Cisco  Surgeon Name: Sharalyn Ink    Chief Complaint   Patient presents with    New Patient     Scar endometriosis in previous Pfannenstiel incision       HPI  35 yo F with history of a low transverse cesarean section 17 years ago.  As it healed she noted a nodule in the middle of the scar - was initially told this was a keloid, later a cyst, and finally got a biopsy that demonstrated endometriosis.  The mass is painful with every menses; overall this has not gotten worse over the last 17 years, but she did have one menses a few cycles ago where it was particularly bad.  The last few have been a little better.  No endometriosis elsewhere that she knows of.  The mass does not visibly change with her menses, just hurts more.  No other major medical problems.      PMH  Past Medical History:   Diagnosis Date    HPV in female     Migraine     Obesity        PSH  Past Surgical History:   Procedure Laterality Date    CHOLECYSTECTOMY, LAP  2006    c-section  2005       FamHx  Family History   Problem Relation Name Age of Onset    Cancer Maternal Grandmother          GI cancer, also in 2 aunts    No Known Problems Mother      Cancer Father  81        lymphoma    Diabetes Neg Hx      Heart Disease Neg Hx      Stroke Neg Hx      Psychiatry Neg Hx      Hypertension Neg Hx      Thyroid Neg Hx      No Known Cancer Neg Hx      No Known Diabetes Neg Hx      Other Neg Hx      No Known Heart Disease Neg Hx         SocHx  Social History     Tobacco Use   Smoking Status Former Smoker   Smokeless Tobacco Never Used     Social History     Substance and Sexual Activity   Alcohol Use Yes    Alcohol/week: 5.0 - 7.5 standard drinks    Types: 2 - 3 Shots of liquor per week     Social History     Substance and Sexual Activity   Drug Use No       Medications  Current Outpatient Medications   Medication Sig Dispense Refill    phentermine 15 MG capsule Take 1 capsule (15 mg)  by mouth every morning. 30 capsule 2    topiramate (TOPAMAX) 25 MG tablet Take 1 tablet (25 mg) by mouth daily. Every evening 30 tablet 2     No current facility-administered medications for this visit.       Allergies  Allergies   Allergen Reactions    Penicillins Hives         Review of Systems    Constitutional: negative.  Cardiovascular: negative.  Resp: negative.  GI: negative  GU: negative.  Musculoskeletal: negative.      Physical Exam  BP 129/87 (BP Location:  Left arm, BP Patient Position: Sitting, BP cuff size: Large)    Pulse 101    Temp 98.4 F (36.9 C) (Temporal)    Resp 18    Ht 5' 0.15" (1.528 m)    Wt 84.4 kg (186 lb 1.1 oz)    SpO2 100%    BMI 36.16 kg/m   Gen: NAD, comfortable, nontoxic  HEENT: anicteric  Abdomen: soft, ND.  Low transverse cesarean section scar well healed, but with ~1.5cm brown nodule in the middle, mildly tender to palpation.  Does not feel fixed to deeper structures.   Skin:  Warm, dry, no jaundice      Imaging  10/29/2020 CT Abdomen-Pelvis:  "IMPRESSION:  CT scan of the abdomen and pelvis with IV contrast.    A 16 x 13 mm superficial soft tissue nodule just to the right of midline at the level of the C-section scar site is consistent with known endometrial implant in the (sub)cutaneous C-section scar site.    A 14 mm right Bartholin gland duct cyst.    Probable uterine fibroid that abuts and appears to cause mass effect with probable submucosal component on the endometrium though not optimally evaluated on CT. Consider ultrasound for further evaluation."      Labs  Lab Results   Component Value Date    WBC 5.4 03/12/2006    HCT 42.2 03/12/2006    PLT 264 03/12/2006       Lab Results   Component Value Date    INR 1.1 05/06/2004       Lab Results   Component Value Date    NA 142 08/20/2020    K 4.6 08/20/2020    CL 106 08/20/2020    BICARB 25 08/20/2020    BUN 12 08/20/2020    CREAT 0.82 08/20/2020    GLU 102 (H) 08/20/2020    [place] 9.8 08/20/2020    MG 2.2 07/02/2004    PHOS  3.2 07/02/2004       Lab Results   Component Value Date    TBILI 0.25 08/20/2020    DBILI 0.2 05/14/2006    ALT 23 08/20/2020    AST 18 08/20/2020    ALK 73 08/20/2020    LIP 33 05/21/2004         Assessment / Plan  Nodule in cesarean section scar, endometriosis by biopsy, symptomatic and painful with menses.  Feels mobile and superficial on exam, CT with no evidence of fascial involvement.  Appropriate for resection for symptom control.  -The risks, benefits and alternatives of the procedure were discussed with the patient in detail.  Risks include infection, bleeding, damage to surrounding structures, recurrence, need for further procedures, and risks of anesthesia.  All questions were answered, and informed consent obtained.  Discussed that we cannot guarantee that this will not recur, though if mass is fully remove recurrence is unlikely.  -OK for [facility] anesthesia for surgery.  -Tentatively scheduled for outpatient procedure 02/10/2021.      Serena Croissant, MD  Trauma, Surgical Critical Care and Acute Care Surgery  PID# [id number]  Pager: [phone]

## 2020-11-22 NOTE — Patient Instructions (Signed)
Emergency General Surgery  Pre-Operative Instructions    Surgery Date and Time: 12/23/20 @ 3:30 pm    Prior to your surgery, it will be necessary for you to have a preoperative evaluation with the Anesthesia Preparedness Clinic [facility]).     Your APC appointment will be 12/18/20 @ 2 pm. This will be a telephone consultation.      The night before your surgery, you should have nothing to eat or drink after midnight.  If you are taking blood pressure or heart medications, you may take these the morning of your surgery with a small sip of water. Please let the doctor or nurse know if you are taking aspirin, plavix, coumadin, or insulin. You will receive additional instructions for taking these medications.     On the day of your surgery, you will need to arrive at the hospital 2 hours before your scheduled surgery time. You will report to the registration desk as you enter the hospital. They will direct you to the 2nd floor.    Surgery date & time:  12/23/20 @ 3:30 pm    Arrival time is 2 hours prior to your surgery: 1:30 pm     [facility]  [address]  [place], [place]    Please bring your ID, insurance card and contact information for your ride home. Please do not bring any valuables.    If you have questions regarding your surgery or need any clarification about what was discussed today, please feel free to contact:    Johnston Ebbs (surgery scheduling questions)  [phone]  [email or web address]    Cristal Generous, BSN (clinical questions)  [phone]  [email or web address] (preferred method of communication)    [place] customer support at [phone]

## 2020-12-04 ENCOUNTER — Encounter (HOSPITAL_BASED_OUTPATIENT_CLINIC_OR_DEPARTMENT_OTHER): Payer: Self-pay | Admitting: Gynecology

## 2020-12-18 ENCOUNTER — Encounter (INDEPENDENT_AMBULATORY_CARE_PROVIDER_SITE_OTHER): Payer: Self-pay | Admitting: Family Medicine

## 2020-12-18 ENCOUNTER — Ambulatory Visit (INDEPENDENT_AMBULATORY_CARE_PROVIDER_SITE_OTHER): Payer: Self-pay | Admitting: Family Medicine

## 2020-12-18 ENCOUNTER — Telehealth (HOSPITAL_BASED_OUTPATIENT_CLINIC_OR_DEPARTMENT_OTHER): Payer: Self-pay

## 2020-12-18 NOTE — Telephone Encounter (Signed)
Symptom Call         What symptom is the patient experiencing? Redness and itchiness on elbows, behind ears and scalp.   Is this a new or ongoing symptom? ongoing  When did the symptom(s) begin? 2 weeks  Who is reporting the symptoms? Incoming call from patient's mother Ogilvie: yes  Insurance Plan Name:  Verlee Monte to [place]: yes  Next office visit:  None    Transferred call to: Cold Symptoms Line (ext [phone])    Best way to contact patient: [phone] (mobile)   Alternative communication method: [phone] (mobile)       P CARE NAV TRIAGE POOL [ [id number] ]

## 2020-12-18 NOTE — Telephone Encounter (Signed)
PROVIDER ACTION REQUESTED: No, FYI only   Action item needed:  None    Appt scheduled:   Yes  Future Appointments   Date Time Provider [place]   12/20/2020  9:20 AM Mariel Sleet, MD [facility]   01/27/2021 12:30 PM PROVIDER [place] ANES [place] [facility]   02/04/2021  8:40 AM Tantisira, Tammi Klippel, MD [place] UTC   02/26/2021  1:20 PM [place], Dollene Cleveland, MD MON Srg Tram MON       Chief Complaint  rash x 2 weeks  Assessment details:    Reports x2 weeks of red, dry patches that are itchy. About dime size located on bilateral elbow, behind ears and scalp. Hx of eczema but home treatments are not working and unsure if this is eczema. No drainage, fever, pain.     CLINIC/RN/LVN ACTION REQUEST: NO, FYI Only  Action item needed: NO, FYI Only    Advice given, Acute appt scheduled and pt given strict ED precautions and reviewed all applicable Home Care Advice per protocol.       COVID VACCINATION STATUS: Yes, fully vaccinated  COVID TRIAGE PROTOCOL: NEGATIVE   Symptoms w/in the last 10 days (no F2F appt): fever or chills, cough, sob/diff breathing, fatigue, muscle or body aches, headache, loss of taste or smell, sore throat, congestion or runny nose, nausea and vomiting, diarrhea.   Questions:   1. Looking back to the last 10 days, have you been informed by a public health agency or a healthcare agency or a healthcare system that you have been exposed to Eugenio Saenz (COVID-19)  2. Looking back to the last 10 days, has a person in your household been diagnosed with Coronavirus (COVID-19) infection       Reason for Call: Rash     Disposition: See PCP When Office is Open (Within 3 Days)     Reason for Disposition   Localized rash present > 7 days    Additional Information   Negative: [1] Sudden onset of rash (within last 2 hours) AND [2] difficulty breathing or swallowing   Negative: Sounds like a life-threatening emergency to the triager   Negative: Poison ivy, oak, or sumac contact  suspected   Negative: Insect bite(s) suspected   Negative: Ringworm suspected (i.e., round pink patch, sometimes looks like ring, usually 1/2 to 1 inch [12-25 mm],  in size, slowly increasing in size)   Negative: Athlete's Foot suspected (i.e., itchy rash between the toes)   Negative: Jock Itch suspected (i.e., itchy rash on inner thighs near genital area)   Negative: Wound infection suspected (i.e., pain, spreading redness, or pus; in a cut, puncture, scrape or sutured wound)   Negative: Impetigo suspected  (i.e., painless infected superficial small sores, less than 1 inch or 2.5 cm, often covered by a soft, yellow-brown scab or crust; sometimes occurring near nasal openings)   Negative: Shingles suspected (i.e., painful rash, multiple small blisters grouped together in one area of body; dermatomal distribution)   Negative: Rash of external female genital area (vulva)   Negative: Rash of penis or scrotum   Negative: Small spot, skin growth, or mole   Negative: Sores or skin ulcer, not a rash   Negative: Localized lump (or swelling) without redness or rash   Negative: [1] Localized purple or blood-colored spots or dots AND [2] not from injury or friction AND [3] fever   Negative: Patient sounds very sick or weak to the triager   Negative: [  1] Red area or streak AND [2] fever   Negative: [1] Rash is painful to touch AND [2] fever   Negative: [1] Looks infected (spreading redness, pus) AND [2] large red area (> 2 in. or 5 cm)   Negative: [1] Looks infected (spreading redness, pus) AND [2] diabetes mellitus or weak immune system (e.g., HIV positive, cancer chemo, splenectomy, organ transplant, chronic steroids)   Negative: [1] Localized purple or blood-colored spots or dots AND [2] not from injury or friction AND [3] no fever   Negative: [1] Looks infected (spreading redness, pus) AND [2] no fever   Negative: Looks like a boil, infected sore, deep ulcer or other infected rash   Negative: [1]  Localized rash is very painful AND [2] no fever   Negative: Genital area rash   Negative: Lyme disease suspected (e.g., bull's eye rash or tick bite / exposure)   Negative: [1] Applying cream or ointment AND [2] causes severe itch, burning or pain   Negative: Medication patch causing local rash or itching   Negative: [1] Pimples (localized) AND [place].Ok ] no improvement after using CARE ADVICE   Negative: Tender bumps in armpits   Negative: [1] Severe localized itching AND [2] after 2 days of steroid cream    Answer Assessment - Initial Assessment Questions  1. APPEARANCE of RASH: "Describe the rash."       Redness and itchiness on bilateral elbow, behind ears and scalp.     2. LOCATION: "Where is the rash located?"       See note    3. NUMBER: "How many spots are there?"       Multiple    4. SIZE: "How big are the spots?" (Inches, centimeters or compare to size of a coin)       Various    5. ONSET: "When did the rash start?"       2 weeks    6. ITCHING: "Does the rash itch?" If Yes, ask: "How bad is the itch?"  (Scale 1-10; or mild, moderate, severe)      Mild    7. PAIN: "Does the rash hurt?" If Yes, ask: "How bad is the pain?"  (Scale 1-10; or mild, moderate, severe)      No    8. OTHER SYMPTOMS: "Do you have any other symptoms?" (e.g., fever)      None    9. PREGNANCY: "Is there any chance you are pregnant?" "When was your last menstrual period?"      no    Protocols used: RASH OR REDNESS - LOCALIZED-A-AH

## 2020-12-19 NOTE — Progress Notes (Signed)
Family Medicine Progress Note - Follow-up  Ruth Hall is a 35 year old adult who presents for follow-up on:  Patient's most important issues for today's visit:    PATIENT'S MOST IMPORTANT ISSUES 12/20/2020 08/13/2020   Patient's Most Important Issues For Your Visit Rash The pain on my endometriosis scar on c- sectiin.   Some recent data might be hidden             Assessment/Plan:  1. Other eczema  Recurrence of prior eczema  R knee with white flaking after OTC wart treatment  Making it difficult to determine underlying pathology  Some flaking in scalp eczema  Discussed with patient that ddx includes psoriasis among others but given itching, history of eczema, and recurrence in similar location and symptoms likely still eczema  Trial of hydrocortisone  Return if not improving and would consider bx and further evaluation.  Recommend no further OTC treatments  - hydrocortisone 2.5 % ointment; Apply 1 Application topically 2 times daily. Use a small amount as directed for 2-4 weeks and assess for improvement  Dispense: 1 each; Refill: 1    2. Healthcare maintenance:  Patient declines all vaccination including tdap   Advised on r/b             HPI: 35 yo female with history of eczema presents with 2 weeks of rash.   #  *eczema: had previously in pregnancy x1 and used some cream, which helped  Now has recurrence of rash for 3 weeks, itchy, present on elbows and knees on extensor surfaces - same as last time and feels the same to patient  Now also has on scalp and is itchy and flaking - didn't have on scalp previously  Tried OTC creams for eczema  No personal history of atopy otherwise  No family history of atopy  When patch developed on R knee - patient thought was a wart and put OTC wart treatment on it.     [facility] [place]  # Vaccine: patient encouraged to get tdap at a minimum  Advised on risks of tetanus - patient declines all vaccine "I don't want a vaccine"       ROS as per HPI    Allergies    Allergen Reactions    Penicillins Hives           Current Outpatient Medications:     phentermine 15 MG capsule, Take 1 capsule (15 mg) by mouth every morning., Disp: 30 capsule, Rfl: 2    topiramate (TOPAMAX) 25 MG tablet, Take 1 tablet (25 mg) by mouth daily. Every evening, Disp: 30 tablet, Rfl: 2    O:  BP 113/82 (BP Location: Left arm, BP Patient Position: Sitting, BP cuff size: Regular)    Pulse 85    Temp 98.8 F (37.1 C) (Temporal)    Ht 5' (1.524 m)    Wt 82.6 kg (182 lb)    LMP 12/15/2020    SpO2 98%    BMI 35.54 kg/m   PAVS:   minutes/week  Physical Exam  DerM: erythematous patch on Knee with white scale  erythema of extensor surfaces of elbows bilaterally with small papules  Erutehma behind R ear with papules extending into hair line  Small patch with white scale visible at lower hair line on R

## 2020-12-20 ENCOUNTER — Other Ambulatory Visit: Payer: Self-pay

## 2020-12-20 ENCOUNTER — Ambulatory Visit (INDEPENDENT_AMBULATORY_CARE_PROVIDER_SITE_OTHER): Payer: HMO | Admitting: Public Health & General Preventive Medicine

## 2020-12-20 ENCOUNTER — Encounter (INDEPENDENT_AMBULATORY_CARE_PROVIDER_SITE_OTHER): Payer: Self-pay | Admitting: Public Health & General Preventive Medicine

## 2020-12-20 VITALS — BP 113/82 | HR 85 | Temp 98.8°F | Ht 60.0 in | Wt 182.0 lb

## 2020-12-20 DIAGNOSIS — Z0000 Encounter for general adult medical examination without abnormal findings: Secondary | ICD-10-CM

## 2020-12-20 DIAGNOSIS — Z23 Encounter for immunization: Secondary | ICD-10-CM

## 2020-12-20 DIAGNOSIS — L308 Other specified dermatitis: Secondary | ICD-10-CM

## 2020-12-20 DIAGNOSIS — Z1339 Encounter for screening examination for other mental health and behavioral disorders: Secondary | ICD-10-CM

## 2020-12-20 DIAGNOSIS — Z1389 Encounter for screening for other disorder: Secondary | ICD-10-CM

## 2020-12-20 DIAGNOSIS — R21 Rash and other nonspecific skin eruption: Secondary | ICD-10-CM

## 2020-12-20 MED ORDER — HYDROCORTISONE 2.5 % EX OINT
1.0000 | TOPICAL_OINTMENT | Freq: Two times a day (BID) | CUTANEOUS | 1 refills | Status: DC
Start: 2020-12-20 — End: 2021-01-10

## 2021-01-08 ENCOUNTER — Encounter (HOSPITAL_BASED_OUTPATIENT_CLINIC_OR_DEPARTMENT_OTHER): Payer: Self-pay | Admitting: Student in an Organized Health Care Education/Training Program

## 2021-01-09 ENCOUNTER — Other Ambulatory Visit (INDEPENDENT_AMBULATORY_CARE_PROVIDER_SITE_OTHER): Payer: Self-pay | Admitting: Public Health & General Preventive Medicine

## 2021-01-09 ENCOUNTER — Other Ambulatory Visit (INDEPENDENT_AMBULATORY_CARE_PROVIDER_SITE_OTHER): Payer: Self-pay | Admitting: Endocrinology

## 2021-01-09 DIAGNOSIS — Z6837 Body mass index (BMI) 37.0-37.9, adult: Secondary | ICD-10-CM

## 2021-01-09 DIAGNOSIS — L308 Other specified dermatitis: Secondary | ICD-10-CM

## 2021-01-09 DIAGNOSIS — E669 Obesity, unspecified: Secondary | ICD-10-CM

## 2021-01-09 MED ORDER — PHENTERMINE HCL 15 MG OR CAPS
ORAL_CAPSULE | ORAL | 2 refills | Status: DC
Start: 2021-01-09 — End: 2021-02-04

## 2021-01-09 NOTE — Telephone Encounter (Signed)
Established with:  Last OV with PCP:  Next OV with Dept:  Future Appointments: Truddie Hidden   12/20/2020  Visit date not found  Future Appointments   Date Time Provider [place]   01/27/2021 12:30 PM PROVIDER B [facility] ANES [place] [facility]   02/04/2021  8:40 AM Tantisira, Tammi Klippel, MD [facility] [place]   02/26/2021  1:20 PM Diller, Dollene Cleveland, MD MON Srg Tram MON        Medication requested:   Requested Prescriptions     Pending Prescriptions Disp Refills   . hydrocortisone 2.5 % ointment [Pharmacy Med Name: HYDROCORTISONE 2.5% OINTMENT] 20 g 1     Sig: APPLY 1 APPLICATION TOPICALLY 2 TIMES DAILY. USE A SMALL AMOUNT AS DIRECTED FOR 2-4 WEEKS AND ASSESS FOR IMPROVEMENT       Last Filled Date    Quantity Last Filled     Send to:    [id number] [address], [place]  [place] [address]  Phone: [phone] Fax: [phone]      Current Medication(s):  Current Outpatient Medications   Medication Sig Dispense Refill   . hydrocortisone 2.5 % ointment Apply 1 Application topically 2 times daily. Use a small amount as directed for 2-4 weeks and assess for improvement 1 each 1   . phentermine 15 MG capsule TAKE 1 CAPSULE BY MOUTH EVERY MORNING. (NOT COVERED) 30 capsule 2   . topiramate (TOPAMAX) 25 MG tablet Take 1 tablet (25 mg) by mouth daily. Every evening 30 tablet 2     No current facility-administered medications for this visit.       Patient information: Allergies   Allergen Reactions   . Penicillins Hives      Health Maintenance Due   Topic Date Due   . COVID-19 Vaccine (4 - Booster for Pfizer series) 06/21/2020   . PHQ9 Depression Monitoring doc flowsheet  12/21/2020      Last labs:  Lab Results   Component Value Date    CHOL 159 08/30/2020    HDL 45 08/30/2020    LDLCALC 83 08/30/2020    TRIG 156 08/30/2020    TSH 1.13 08/20/2020    A1C 5.6 08/20/2020      Blood Pressure   12/20/20 113/82   11/22/20 129/87   10/24/20 127/88    No components found  for: 80M

## 2021-01-09 NOTE — Telephone Encounter (Signed)
hydrocortisone is not currently included in the Pharmacy Refill Clinic protocols. Re-routing to the responsible staff for processing.  Thank you

## 2021-01-10 MED ORDER — HYDROCORTISONE 2.5 % EX OINT
1.0000 | TOPICAL_OINTMENT | Freq: Two times a day (BID) | CUTANEOUS | 11 refills | Status: DC
Start: 2021-01-10 — End: 2021-04-07

## 2021-01-10 NOTE — Telephone Encounter (Signed)
General Inquiry     Who is calling: Incoming call from Relative: mother Ellis Parents on behalf of patient    Reason for this call: Ellis Parents is calling in to check status on refill. Ellis Parents is requesting to know if there can be 11 refills on Rx.     Ellis Parents states that patient is out of medication.     Action required by office: Please contact caller     Duplicate encounter? No previous documentation found on this issue.     Best way to contact: [phone]  Alternative: n/a    Inquiry has been read verbatim to this caller. Verbalizes satisfaction and confirms the above is accurate: yes    Has been advised this message will be transmitted to office and can expect a response within the next 24-72 hours.    Encounter created by Care Assist MA.  If further action required please route encounter to appropriate in clinic MA/LVN/Resident Pool

## 2021-01-27 ENCOUNTER — Encounter (INDEPENDENT_AMBULATORY_CARE_PROVIDER_SITE_OTHER): Payer: Self-pay

## 2021-01-27 ENCOUNTER — Ambulatory Visit (INDEPENDENT_AMBULATORY_CARE_PROVIDER_SITE_OTHER): Payer: HMO

## 2021-01-27 ENCOUNTER — Telehealth (INDEPENDENT_AMBULATORY_CARE_PROVIDER_SITE_OTHER): Payer: HMO

## 2021-01-27 DIAGNOSIS — Z01818 Encounter for other preprocedural examination: Secondary | ICD-10-CM

## 2021-01-27 MED ORDER — OXYCODONE HCL PO: ORAL | Status: AC

## 2021-01-27 NOTE — Patient Instructions (Signed)
PREOPERATIVE SURGICAL INFORMATION     Your surgery is currently scheduled at [facility] on February 10, 2021  The scheduler will be contacting you with the check in time                                                                         [facility], [place], [place], [place], [place] [address]    Please check in at [employer], [place], 1st floor.     PARKING    For [employer]:   Arbor Tour manager (at the end of [address]) or in [address] (at [employer] and [place]: [place] parking is available between 8 a.m. and 4:30 p.m. weekdays at the main entrance to the [employer] on [address]. The cost is the same as self parking. With a handicapped placard, [place] parking is free.   [email or web address]       QUESTIONS    If you have any questions between now and the day of your surgery, please do not hesitate to call:     [facility]: [phone]     DAY OF SURGERY ARRIVAL TIME:    On the day of your Surgery/Procedure, please arrive at the time provided by the surgeon's clinic. If you have any questions regarding your arrival time, please call the surgeon's clinic:      Preoperative Surgical Admissions at [facility]: [phone]        MEDICATION INSTRUCTIONS:       MEDICATION INSTRUCTIONS BEFORE SURGERY/PROCEDURE:      Medications to hold prior to surgery: Please hold Phentermine 7 days before your surgery.    PLEASE HOLD ALL NSAIDS (non-steroidal anti-inflammatory drugs) SUCH AS advil, aleve, motrin, ibuprofen, relafen, lodine, feldene, Diclofenac, voltaren, indomethacin, naproxen, celebrex, Mobic 7 days before surgery.       Please hold vitamins, supplements, CO-Q10, Glucosamine, herbs & fish oil 7 days before surgery. Vitamin C, D are ok to take.     It is OK to take acetaminophen (Tylenol) for pain around the time of surgery unless you  have liver disease.      AFTER YOUR VISIT WITH [place], IF YOU START TAKING A NEW MEDICATION BEFORE SURGERY, PLEASE CALL us TO MAKE SURE IT IS SAFE TO TAKE & WILL NOT AFFECT YOUR SURGERY.            EATING/DRINKING     DO NOT EAT OR DRINK ANYTHING AFTER MIDNIGHT ON THE DAY OF SURGERY    Preparing for your Surgery:     Please wear clean loose-fitting clothes and leave valuables at home   Do not shave or remove body hair. Facial shaving is permitted. If you are having head surgery, ask your doctor whether you can shave.  Bring a picture ID and your insurance card, and be prepared to pay your deductible or co-insurance by cash, check, or credit card when you arrive.   If you are going home after your surgery, please make sure to arrange for an adult to drive you home. You CANNOT use UBER or LYFT. If you do not have a ride, your surgery may be  cancelled.       On The Day of Your Surgery:      Check in at the location mentioned above   COVID testing may be done on arrival to the Pre-op or Procedural areas according to the current CDPH mandate.   If you are a woman of child bearing age, please note that you may be asked to give a urine sample upon check-in  You will meet your anesthesia and surgery teams in the preoperative holding area before surgery.   Once surgery is over, you will wake up in the recovery room.  If you go home, an adult chaperone will need to stay with you for the first 24 hours after surgery.   Visitor policy during the SHFWY-63 pandemic is subject to change. Current visitor policy can be found at [email or web address]      A video about what to expect for the day of surgery can be found here:    [email or web address]  Or by searching You-tube for [place] before surgery and [place] after surgery     You medical records are available to you at [email or web address] click sign up now.

## 2021-01-27 NOTE — Interdisciplinary (Signed)
Anesthesia Preparedness Clinic [facility]) RN PHONE CALL NOTE    Phone call to patient from [facility] RN today.     Confirmed medical history & that medications listed in Epic are accurate and up to date.     Patient scheduled for surgery on February 10, 2021 at [facility].    Patient denies any cardiac or pulmonary conditions, denies CP or SOB on exertion.    Confirmed preoperative instructions with patient including NPO instructions.     Planning your surgery information sent to patient via [place].    No questions or concerns at this time.

## 2021-01-29 ENCOUNTER — Encounter (INDEPENDENT_AMBULATORY_CARE_PROVIDER_SITE_OTHER): Payer: Self-pay | Admitting: Endocrinology

## 2021-01-29 ENCOUNTER — Encounter (INDEPENDENT_AMBULATORY_CARE_PROVIDER_SITE_OTHER): Payer: Self-pay

## 2021-01-29 DIAGNOSIS — Z6837 Body mass index (BMI) 37.0-37.9, adult: Secondary | ICD-10-CM

## 2021-02-04 ENCOUNTER — Ambulatory Visit (INDEPENDENT_AMBULATORY_CARE_PROVIDER_SITE_OTHER): Payer: HMO | Admitting: Endocrinology

## 2021-02-04 ENCOUNTER — Encounter (INDEPENDENT_AMBULATORY_CARE_PROVIDER_SITE_OTHER): Payer: Self-pay | Admitting: Endocrinology

## 2021-02-04 ENCOUNTER — Encounter (INDEPENDENT_AMBULATORY_CARE_PROVIDER_SITE_OTHER): Payer: Self-pay

## 2021-02-04 DIAGNOSIS — Z6834 Body mass index (BMI) 34.0-34.9, adult: Secondary | ICD-10-CM

## 2021-02-04 DIAGNOSIS — E6609 Other obesity due to excess calories: Secondary | ICD-10-CM

## 2021-02-04 MED ORDER — TOPIRAMATE 25 MG OR TABS
25.0000 mg | ORAL_TABLET | Freq: Every day | ORAL | 2 refills | Status: DC
Start: 2021-02-04 — End: 2022-05-20

## 2021-02-04 MED ORDER — PHENTERMINE HCL 30 MG OR CAPS
30.0000 mg | ORAL_CAPSULE | Freq: Every morning | ORAL | 0 refills | Status: DC
Start: 2021-02-04 — End: 2021-09-17

## 2021-02-04 NOTE — Progress Notes (Signed)
Subjective:   Ruth Hall is a 35 year old female who is here for Follow Up (4 mo f/u )      HPI  Has always been struggling with her weight since age 37.   Lowest adult 150 lbs  Highest adult weight 250 lbs with pregnancy (2005)  Lost weight with phentermine 10 years ago -->165-170 lbs  1st visit 7/22 193 lbs--> started phen/top.   Current weight 176 lbs    Phentermine 15 mg AM--> 30 mg in 8/22  topamax 25 mg at night.   Denies palpitations, insomnia, jittery.  No migraine since started topamax.   Was on a cruise last month, did not take med for 2 weeks.   Eats small portion. Eats 2 meals a day.   Recall  B: oatmeal  L: salad  D: may have cereal.   Drinks mostly water, 1 OJ.   Stopped drinking soda.   ETOH: occasionally 1/month.     Works at a retirement home.   Has more energy.   Has been more active. Hiking  Personal trainer 2/week.    Past Medical History:   Diagnosis Date    Blood transfusion declined because patient is Jehovah's Witness     HPV in female     Migraine     Obesity          Review of Systems   Constitutional: Negative for chills and fever.   HENT: Positive for congestion.    Respiratory: Negative for cough and shortness of breath.    Cardiovascular: Negative for chest pain and palpitations.   Gastrointestinal: Negative for constipation and diarrhea.   Genitourinary: Negative for menstrual problem.   Psychiatric/Behavioral: Negative for sleep disturbance.    remainder ROS neg      Current Outpatient Medications   Medication Sig Dispense Refill    hydrocortisone 2.5 % ointment APPLY 1 APPLICATION TOPICALLY 2 TIMES DAILY. USE A SMALL AMOUNT AS DIRECTED FOR 2-4 WEEKS AND ASSESS FOR IMPROVEMENT 20 g 11    OXYCODONE HCL PO       phentermine 30 MG capsule Take 1 capsule (30 mg) by mouth every morning. 90 capsule 0    topiramate (TOPAMAX) 25 MG tablet Take 1 tablet (25 mg) by mouth daily. Every evening 90 tablet 2     No current facility-administered medications for this visit.      Allergies   Allergen Reactions    Penicillins Hives     Other reaction(s): Hives       Reviewed patients pertinent information related to social history, past medical, past surgical, and family history.     Objective:  Vital signs: BP 122/78 (BP Location: Right arm, BP Patient Position: Sitting, BP cuff size: Large)    Pulse 74    Temp 97.7 F (36.5 C) (Temporal)    Resp 18    Ht 5' (1.524 m)    Wt 80.1 kg (176 lb 8 oz)    LMP 01/27/2021 (Approximate)    SpO2 100%    BMI 34.47 kg/m     Physical Exam  Constitutional:       General: She is not in acute distress.  Cardiovascular:      Rate and Rhythm: Normal rate and regular rhythm.      Heart sounds: No murmur heard.    No gallop.   Pulmonary:      Effort: Pulmonary effort is normal. No respiratory distress.      Breath sounds: Normal breath sounds.  No wheezing.   Abdominal:      General: There is no distension.      Tenderness: There is no abdominal tenderness.   Musculoskeletal:      Right lower leg: No edema.      Left lower leg: No edema.   Neurological:      Mental Status: She is alert and oriented to person, place, and time.   Psychiatric:         Mood and Affect: Mood normal.         Behavior: Behavior normal.           Lab Results   Component Value Date    A1C 5.6 08/20/2020    A1C 5.3 09/23/2017    A1C 5.3 11/29/2014        Lab Results   Component Value Date    TSH 1.13 08/20/2020       Lab Results   Component Value Date    CHOL 159 08/30/2020    HDL 45 08/30/2020    LDLCALC 83 08/30/2020    TRIG 156 08/30/2020        Lab Results   Component Value Date    WBC 5.4 03/12/2006    RBC 4.83 03/12/2006    HGB 14.2 03/12/2006    HCT 42.2 03/12/2006    MCV 87.5 03/12/2006    MCHC 33.6 03/12/2006    RDW 12.5 03/12/2006    PLT 264 03/12/2006    MPV Positive 07/06/2011       Lab Results   Component Value Date    BUN 12 08/20/2020    CREAT 0.82 08/20/2020    CL 106 08/20/2020    NA 142 08/20/2020    K 4.6 08/20/2020    [place] 9.8 08/20/2020    TBILI 0.25 08/20/2020     ALB 4.4 08/20/2020    TP 8.0 08/20/2020    AST 18 08/20/2020    ALK 73 08/20/2020    BICARB 25 08/20/2020    ALT 23 08/20/2020    GLU 102 (H) 08/20/2020        No results found for: Louanne Skye, VD2, VD3, VDT     Assessment/Plan:  Ruth Hall was seen today for follow up.    Diagnoses and all orders for this visit:    Class 1 obesity due to excess calories without serious comorbidity with body mass index (BMI) of 34.0 to 34.9 in adult  -     Weight Management Services - Follow Up in This Department  -     phentermine 30 MG capsule; Take 1 capsule (30 mg) by mouth every morning.  -     topiramate (TOPAMAX) 25 MG tablet; Take 1 tablet (25 mg) by mouth daily. Every evening    Other orders  -     CURES Review Documentation - I Reviewed CURES    35 yo lady obesity class 2--> now class 1 BMI 34 with knee pain, migraine. Doing well with diet, exercise, phen/top,    Plan  - diet counseling  - regular exercise  - continue current dose phen/top  - RTC 4-5 months

## 2021-02-10 ENCOUNTER — Encounter (HOSPITAL_COMMUNITY): Admission: RE | Disposition: A | Discharge status: Home Health | Payer: Self-pay | Attending: Surgery

## 2021-02-10 ENCOUNTER — Other Ambulatory Visit: Payer: Self-pay

## 2021-02-10 ENCOUNTER — Ambulatory Visit (HOSPITAL_BASED_OUTPATIENT_CLINIC_OR_DEPARTMENT_OTHER): Payer: HMO | Admitting: Anesthesiology

## 2021-02-10 ENCOUNTER — Ambulatory Visit
Admission: RE | Admit: 2021-02-10 | Discharge: 2021-02-10 | Disposition: A | Discharge status: Home / Self Care | Payer: HMO | Attending: Surgery | Admitting: Surgery

## 2021-02-10 ENCOUNTER — Ambulatory Visit (HOSPITAL_COMMUNITY): Payer: HMO | Admitting: Anesthesiology

## 2021-02-10 DIAGNOSIS — N808 Other endometriosis: Secondary | ICD-10-CM | POA: Insufficient documentation

## 2021-02-10 DIAGNOSIS — E669 Obesity, unspecified: Secondary | ICD-10-CM | POA: Insufficient documentation

## 2021-02-10 DIAGNOSIS — L905 Scar conditions and fibrosis of skin: Secondary | ICD-10-CM

## 2021-02-10 DIAGNOSIS — R1900 Intra-abdominal and pelvic swelling, mass and lump, unspecified site: Secondary | ICD-10-CM | POA: Insufficient documentation

## 2021-02-10 DIAGNOSIS — N806 Endometriosis in cutaneous scar: Secondary | ICD-10-CM

## 2021-02-10 SURGERY — EXCISION, LESION OR FOREIGN BODY, HEAD OR NECK
Anesthesia: General | Wound class: Class I (Clean)

## 2021-02-10 MED ORDER — MIDAZOLAM HCL 2 MG/2ML IJ SOLN
INTRAMUSCULAR | Status: AC
Start: 2021-02-10 — End: ?
  Filled 2021-02-10: qty 2

## 2021-02-10 MED ORDER — FENTANYL CITRATE (PF) 50 MCG/ML IJ SOLN (WRAPPED RECORD) ~~LOC~~
25.0000 ug | INTRAMUSCULAR | Status: DC | PRN
Start: 2021-02-10 — End: 2021-02-10

## 2021-02-10 MED ORDER — LACTATED RINGERS IV SOLN
INTRAVENOUS | Status: DC
Start: 2021-02-10 — End: 2021-02-10

## 2021-02-10 MED ORDER — DIPHENHYDRAMINE HCL 50 MG/ML IJ SOLN
12.5000 mg | Freq: Once | INTRAMUSCULAR | Status: DC | PRN
Start: 2021-02-10 — End: 2021-02-10

## 2021-02-10 MED ORDER — HYDROMORPHONE HCL 1 MG/ML IJ SOLN
0.5000 mg | INTRAMUSCULAR | Status: DC | PRN
Start: 2021-02-10 — End: 2021-02-10

## 2021-02-10 MED ORDER — METOPROLOL TARTRATE 5 MG/5ML IV SOLN
5.0000 mg | INTRAVENOUS | Status: DC | PRN
Start: 2021-02-10 — End: 2021-02-10

## 2021-02-10 MED ORDER — FENTANYL CITRATE (PF) 100 MCG/2ML IJ SOLN
INTRAMUSCULAR | Status: AC
Start: 2021-02-10 — End: ?
  Filled 2021-02-10: qty 2

## 2021-02-10 MED ORDER — LIDOCAINE HCL 1 % IJ SOLN
INTRAMUSCULAR | Status: DC | PRN
Start: 2021-02-10 — End: 2021-02-10
  Administered 2021-02-10 (×2): 11 mL via SUBCUTANEOUS

## 2021-02-10 MED ORDER — FENTANYL CITRATE (PF) 50 MCG/ML IJ SOLN (WRAPPED RECORD) ~~LOC~~
50.0000 ug | INTRAMUSCULAR | Status: DC | PRN
Start: 2021-02-10 — End: 2021-02-10

## 2021-02-10 MED ORDER — DEXMEDETOMIDINE HCL 200 MCG/2ML IV SOLN
INTRAVENOUS | Status: DC | PRN
Start: 2021-02-10 — End: 2021-02-10
  Administered 2021-02-10 (×2): 10 ug via INTRAVENOUS

## 2021-02-10 MED ORDER — CEFAZOLIN SODIUM 1 GM IJ SOLR
INTRAMUSCULAR | Status: DC | PRN
Start: 2021-02-10 — End: 2021-02-10
  Administered 2021-02-10 (×2): 2000 mg via INTRAVENOUS

## 2021-02-10 MED ORDER — MIDAZOLAM HCL 2 MG/2ML IJ SOLN
INTRAMUSCULAR | Status: DC | PRN
Start: 2021-02-10 — End: 2021-02-10
  Administered 2021-02-10 (×2): 2 mg via INTRAVENOUS

## 2021-02-10 MED ORDER — NALOXONE HCL 0.4 MG/ML IJ SOLN
0.1000 mg | INTRAMUSCULAR | Status: DC | PRN
Start: 2021-02-10 — End: 2021-02-10

## 2021-02-10 MED ORDER — LACTATED RINGERS IV SOLN
INTRAVENOUS | Status: DC | PRN
Start: 2021-02-10 — End: 2021-02-10

## 2021-02-10 MED ORDER — LIDOCAINE HCL 2 % IJ SOLN WRAPPED RECORD
INTRAMUSCULAR | Status: DC | PRN
Start: 2021-02-10 — End: 2021-02-10
  Administered 2021-02-10 (×2): 20 mg via INTRAVENOUS

## 2021-02-10 MED ORDER — PROPOFOL 1000 MG/100ML IV EMUL
INTRAVENOUS | Status: DC | PRN
Start: 2021-02-10 — End: 2021-02-10
  Administered 2021-02-10 (×2): 100 ug/kg/min via INTRAVENOUS
  Administered 2021-02-10: 150 ug/kg/min via INTRAVENOUS

## 2021-02-10 MED ORDER — PROPOFOL 200 MG/20ML IV EMUL
INTRAVENOUS | Status: DC | PRN
Start: 2021-02-10 — End: 2021-02-10
  Administered 2021-02-10 (×3): 50 mg via INTRAVENOUS

## 2021-02-10 MED ORDER — MEPERIDINE HCL 25 MG/ML IJ SOLN
12.5000 mg | INTRAMUSCULAR | Status: DC | PRN
Start: 2021-02-10 — End: 2021-02-10

## 2021-02-10 MED ORDER — ONDANSETRON HCL 4 MG/2ML IV SOLN
4.0000 mg | Freq: Once | INTRAMUSCULAR | Status: DC | PRN
Start: 2021-02-10 — End: 2021-02-10

## 2021-02-10 MED ORDER — FENTANYL CITRATE (PF) 250 MCG/5ML IJ SOLN
INTRAMUSCULAR | Status: DC | PRN
Start: 2021-02-10 — End: 2021-02-10
  Administered 2021-02-10 (×3): 50 ug via INTRAVENOUS

## 2021-02-10 MED ORDER — IBUPROFEN 600 MG OR TABS
600.0000 mg | ORAL_TABLET | Freq: Four times a day (QID) | ORAL | 0 refills | Status: DC | PRN
Start: 2021-02-10 — End: 2021-04-07
  Filled 2021-02-10: qty 100, 25d supply, fill #0

## 2021-02-10 MED ORDER — LIDOCAINE HCL (PF) 1 % IJ SOLN
0.1000 mL | Freq: Once | INTRAMUSCULAR | Status: DC | PRN
Start: 2021-02-10 — End: 2021-02-10

## 2021-02-10 MED ORDER — ACETAMINOPHEN 500 MG OR TABS
1000.0000 mg | ORAL_TABLET | Freq: Four times a day (QID) | ORAL | 0 refills | Status: DC | PRN
Start: 2021-02-10 — End: 2021-04-07
  Filled 2021-02-10: qty 100, 13d supply, fill #0

## 2021-02-10 SURGICAL SUPPLY — 31 items
APPLICATOR CHLORAPREP 26ML, ~~LOC~~ (Prep Solutions) IMPLANT
COVER LIGHT HANDLE RIGID (2 PACK) (Misc Surgical Supply) IMPLANT
DERMABOND ADVANCE 0.7ML (Dressings/packing) ×2 IMPLANT
DRAPE LAPAROTOMY W/ARMBOARD COVERS (T-DRAPE) (Drapes/towels) ×4 IMPLANT
GLOVE BIOGEL INDICATOR UNDERGLOVE SIZE 8.5 (Gloves/Gowns) ×2 IMPLANT
GLOVE BIOGEL PI INDICATOR SIZE 6 (Gloves/Gowns) ×2 IMPLANT
GLOVE BIOGEL PI INDICATOR SIZE 6.5 (Gloves/Gowns) ×6 IMPLANT
GLOVE BIOGEL PI INDICATOR SIZE 7 (Gloves/Gowns) ×8 IMPLANT
GLOVE SURGEON BIOGEL SIZE 7 (Gloves/Gowns) ×2 IMPLANT
GLOVE SURGICAL BIOGEL SIZE 6 (Gloves/Gowns) ×4 IMPLANT
GLOVE SURGICAL BIOGEL SIZE 8.5 (Gloves/Gowns) ×2 IMPLANT
GOWN MICRO COOL LG BLUE, AAMI LVL 4 (Gloves/Gowns) ×6 IMPLANT
GOWN SIRUS XLG BLUE, AAMI LVL 4 (Gloves/Gowns) ×2 IMPLANT
GOWN SURGICAL ULTRA LG BLUE, AAMI LVL 3 (Gloves/Gowns) ×2 IMPLANT
GOWN SURGICAL XXLG BLUE (Gloves/Gowns) ×2 IMPLANT
NEEDLE PROTECT 22G X 1.5" (Needles/punch/cannula/biopsy) IMPLANT
PAD GROUND VALLEYLAB REM ADULT E7507 (Cautery) IMPLANT
PROTECTOR ULNAR NERVE PAD, YELLOW (Patient Care Supply) ×6 IMPLANT
SKIN AFFIX 0.4ML HV (Dressings/packing) ×4 IMPLANT
SLEEVE SCD KNEE MEDIUM (Patient Care Supply) ×4 IMPLANT
SOLUTION IRR POUR BTL 0.9% NS 1000ML (Non-Pharmacy Meds/Solutions) ×2 IMPLANT
SOLUTION IRR POUR BTL H20 1000ML (Non-Pharmacy Meds/Solutions) ×2 IMPLANT
SURGICAL PACK BASIC MAJOR SET-UP (Procedure Packs/kits) ×2 IMPLANT
SUTURE MONOCRYL 4-0 18" PS-2 (Y496) (Suture) ×2 IMPLANT
SUTURE MONOCRYL PLUS 4-0 27" PS-2 MCP426 (Suture) ×2 IMPLANT
SUTURE PERMA-HAND SILK 2-0 18" FS 685H (Suture) IMPLANT
SUTURE VICRYL PLUS 3-0 27" PS-2 VCP427H (Suture) ×4 IMPLANT
SUTURE VICRYL PLUS 3-0 27" SH VCP416 (Suture) ×2 IMPLANT
SYRINGE 10CC LL CONTRL (Needles/punch/cannula/biopsy) IMPLANT
SYRINGE HYPO LL 20CC (Needles/punch/cannula/biopsy) ×2 IMPLANT
TOWELS OR BLUE 4-PACK STERILE, DISPOSABLE (Drapes/towels) ×4 IMPLANT

## 2021-02-10 NOTE — Op Note (Signed)
DATE OF SERVICE:  02/10/2021    PREOPERATIVE DIAGNOSIS:  Endometriosis implant in cesarean section scar.     POSTOPERATIVE DIAGNOSIS:  Endometriosis implant in cesarean section scar.     PROCEDURE PERFORMED:  Excision of subcutaneous mass from cesarean section scar.     SURGEON/STAFF:  Erin Fulling, MD    ASSISTANT:  Ricky Stabs, MD.    ANESTHESIA:  Local and sedation.    FINDINGS:  Subcutaneous mass in her prior cesarean section scar about 2 cm in diameter at the skin level and 2-3 cm deep.  This was excised along with a rim of surrounding fatty tissue and sent for permanent pathology.  No other abnormalities.  This did not extend to the level of the fascia.     SPECIMENS:  Endometrial implant for permanent pathology.    COMPLICATIONS:  None apparent.    DISPOSITION:  Stable to PACU and then home.      INDICATIONS FOR PROCEDURE:  Ruth Hall is a 35 year old woman who initially presented to clinic several months ago with note of a nodule in her cesarean section scar that had been present for approximately 17 years.  This had developed shortly after her cesarean section.  She had received multiple prior diagnoses, but ultimately had a biopsy showing endometriosis.  She was referred to [place] by her OB/GYN, as imaging was indeterminate of whether or not this mass involved the fascia.  This seemed unlikely on exam, but give that it was symptomatic with pain during menses, she was brought to the operating room for elective excision.     DESCRIPTION OF PROCEDURE:  Informed consent had previously been obtained from the patient.  All of her questions were answered.  She was brought into the operating room and placed supine on the operating room table where a surgical briefing was performed.  SCDs were placed and prophylactic IV antibiotics were given.  Sedation was induced by the anesthesia team, which the patient tolerated well.  The abdomen was prepped and draped in sterile fashion, and finally, a surgical  pause was performed.     After confirming appropriate sedation, lidocaine 1% without epinephrine was injected as a field block around the mass in the area of planned incision.  Once confirming good analgesia, we then made a transverse elliptical incision encompassing the mass in line with her prior C-section scar.  The pigmented mass was easily visible.  We then came down around this through the dermis with electrocautery. We did end up taking a small rim of further tissue superiorly to ensure that we were well around the mass and to make sure that the entire area was removed.  A combination of blunt dissection and cautery were then used to come down around the mass, dividing the surrounding fat.  This ultimately tracked only approximately 2-3 cm deep.  It did not involve the fascia.  We were able to come down around it and underneath it in a clear fat plane and felt confident that the entire mass was removed.  It was passed off the field as specimen for permanent pathology.     The incision was irrigated and dried.  It was inspected again.  There was no evidence of residual mass and we had good hemostasis.  The wound was then closed with a combination of interrupted deep dermal 3-0 Vicryl sutures, followed by running subcuticular 4-0 Monocryl, and Skin Affix was applied.     The patient tolerated the procedure well.  She had good  analgesia and sedation throughout.  She was transferred to the recovery room in stable condition.  All sponge, needle, and instrument counts were correct and Dr. Sharalyn Ink was present and supervising for the duration of the procedure.       Job #:  [id number]

## 2021-02-10 NOTE — Anesthesia Preprocedure Evaluation (Signed)
ANESTHESIA PRE-OPERATIVE EVALUATION    Patient Information    Name: Ruth Hall    MRN: [id number]    DOB: 11/13/1985    Age: 35 year old    Sex: female  Procedure(s):  Excision of subcutaneous mass from cesarean section scar      Pre-op Vitals:   BP 140/70 (BP Location: Left arm, BP Patient Position: Sitting)   Pulse 92   Temp 37 C   Resp 16   Ht 5' (1.524 m)   Wt 82.6 kg (182 lb)   LMP 01/27/2021 (Approximate)   SpO2 100%   BMI 35.54 kg/m    BMI kg/m2: 35.54 kg/m2    Primary language spoken:  English    ROS/Medical History:      History of Present Illness: No significant medical history      General:  positive for Obesity,   Cardiovascular:  negative cardio ROS     Anesthesia History:  negative anesthesia history ROS   Pulmonary:   negative pulmonary ROS     Neuro/Psych:   negative neuro/psych ROS   Hematology/Oncology:   hematologic/lymphatic negative      GI/Hepatic:  negative GI/hepatic ROS Infectious Disease:  negative for infectious disease     Renal:  negative renal ROS   Endocrine/Other:  negative endo/other ROS     Pregnancy History:   Pediatrics:         Pre Anesthesia Testing (PCC/CPC) notes/comments:                 Physical Exam    Airway:    Inter-inciser distance 3-4 cm    Mallampati: II  Neck ROM: full  TM distance: 5-6 cm            Cardiovascular:    Rhythm: regular   Rate: normal         Pulmonary:       breath sounds clear to auscultation        Neuro/Neck/Skeletal/Skin:      Dental:      Abdominal:              Last  OSA (STOP BANG) Score:  No data recorded    Last OSA  (STOP) Score for   Has a physician diagnosed you with sleep apnea?: No  Do you use a CPAP at home?: No  Do you snore loudly (loud enough to be heard through a closed door)?: 0  Do you often feel tired, fatigued or sleepy during the day?: 0  Has anyone observed that you stop breathing while you are sleeping?: 0  Have you ever been treated for high blood pressure?: 0  OSA total score (A score of 2 or more is high  risk. Offer patient sleep study.): 0                   Past Medical History:   Diagnosis Date   . Blood transfusion declined because patient is Jehovah's Witness    . HPV in female    . Migraine    . Obesity      Past Surgical History:   Procedure Laterality Date   . CHOLECYSTECTOMY, LAP  2006   . c-section  2005     Social History     Socioeconomic History   . Marital status: Single   . Highest education level: High school graduate   Occupational History   . Occupation: receptionist   Tobacco Use   . Smoking status: Former   .  Smokeless tobacco: Never   Substance and Sexual Activity   . Alcohol use: Yes     Alcohol/week: 5.0 - 7.5 standard drinks     Types: 2 - 3 Shots of liquor per week   . Drug use: No   . Sexual activity: Yes     Partners: Male     Birth control/protection: Pill     Comment: Ortho-Evra patch   Social History Narrative    2019 - Lives with mom, 71yo son, and boyfriend.    Cats, dogs, [place] pig, fish     Social Determinants of Health     Financial Resource Strain: Low Risk    . Difficulty of Paying Living Expenses: Not very hard   Food Insecurity: No Food Insecurity   . Worried About Charity fundraiser in the Last Year: Never true   . Ran Out of Food in the Last Year: Never true   Transportation Needs: No Transportation Needs   . Lack of Transportation (Medical): No   . Lack of Transportation (Non-Medical): No   Physical Activity: Sufficiently Active   . Days of Exercise per Week: 3 days   . Minutes of Exercise per Session: 50 min   Social Connections: Unknown   . Frequency of Communication with Friends and Family: Once a week   . Frequency of Social Gatherings with Friends and Family: Twice a week   . Attends Religious Services: Never   . Active Member of Clubs or Organizations: No   . Attends Archivist Meetings: Never   . Marital Status: Patient refused   Housing Stability: Low Risk    . Unable to Pay for Housing in the Last Year: No   . Number of Places Lived in the Last Year: 1    . Unstable Housing in the Last Year: No     Alcohol Use: Not on file       Current Facility-Administered Medications   Medication Dose Route Frequency Provider Last Rate Last Admin   . lactated ringers infusion   IntraVENOUS Continuous Erin Fulling, MD       . lidocaine (XYLOCAINE) 1% PF injection 0.1 mL  0.1 mL IntraDERMAL Once PRN Erin Fulling, MD         Allergies   Allergen Reactions   . Penicillins Hives     Other reaction(s): Hives       Labs and Other Data  Lab Results   Component Value Date    NA 142 08/20/2020    K 4.6 08/20/2020    CL 106 08/20/2020    BICARB 25 08/20/2020    BUN 12 08/20/2020    CREAT 0.82 08/20/2020    GLU 102 (H) 08/20/2020    [place] 9.8 08/20/2020     Lab Results   Component Value Date    AST 18 08/20/2020    ALT 23 08/20/2020    ALK 73 08/20/2020    TP 8.0 08/20/2020    ALB 4.4 08/20/2020    TBILI 0.25 08/20/2020    DBILI 0.2 05/14/2006     Lab Results   Component Value Date    WBC 5.4 03/12/2006    RBC 4.83 03/12/2006    HGB 14.2 03/12/2006    HCT 42.2 03/12/2006    MCV 87.5 03/12/2006    MCHC 33.6 03/12/2006    RDW 12.5 03/12/2006    PLT 264 03/12/2006    MPV Positive 07/06/2011    SEG 61 03/12/2006  LYMPHS 30 03/12/2006    MONOS 7 03/12/2006    EOS 0 (L) 03/12/2006    BASOS 2 03/12/2006     Lab Results   Component Value Date    INR 1.1 05/06/2004    PTT 26.4 05/06/2004     No results found for: ARTPH, ARTPO2, ARTPCO2    Anesthesia Plan:  Risks and Benefits of Anesthesia  I have personally performed an appropriate pre-anesthesia physical exam of the patient (including heart, lungs, and airway) prior to the anesthetic and reviewed the pertinent medical history, drug and allergy history, laboratory and imaging studies and consultations.   I have determined that the patient has had adequate assessment and testing.  I have validated the documentation of these elements of the patient exam and/or have made necessary changes to reflect my own observations during my  pre-anesthesia exam.  Anesthetic techniques, invasive monitors, anesthetic drugs for induction, maintenance and post-operative analgesia, risks and alternatives have been explained to the patient and/or patient's representatives.    I have prescribed the anesthetic plan:         Planned anesthesia method: General         ASA 2 (Mild systemic disease)     Potential anesthesia problems identified and risks including but not limited to the following were discussed with patient and/or patient's representative: Dental injury or sore throat and Patient declined further  discussion of risks of anesthesia    No Beta Blocker Indicated: Patient not on beta blockers    Planned monitoring method: Routine monitoring    Informed Consent:  Anesthetic plan and risks discussed with Patient.    Plan discussed with Surgeon.

## 2021-02-10 NOTE — Discharge Instructions (Addendum)
[facility] Department of Surgery      Surgery Postoperative Instructions      You had no complications and the surgery went well.    You are likely to have pain for the next few days after surgery. You may also feel like you have the flu, and you may have a low fever and feel tired and nauseated. This is common.  You will probably feel much better in 7 days. For several weeks you may feel twinges or pulling at the surgical site.      This care sheet gives you a general idea about how long it will take for you to recover. But each person recovers at a different pace. Follow the steps below to get better as quickly as possible.    You can expect to feel better and stronger each day. You may get tired easily or have less energy than usual. Rest as you need to, but staying comfortably active will help you heal.    How can you care for yourself at home?    Activity    - Arrange for extra help at home after surgery, especially if you live alone or provide care for another person.  - Be as active as you comfortably can. Do a little more each day. Moving boosts blood flow and helps prevent pneumonia, blood clots, and constipation. Simply walking is excellent activity.  - Avoid strenuous activities for 1-2 weeks after surgery. Examples of these include bicycle riding, jogging, weight lifting, or aerobic exercise.  - Most people are able to return to work within a few days after the type of surgery you did.      Diet    - You can return to your normal diet when you feel well. If your stomach is upset, try bland, low-fat foods like plain rice, broiled chicken, toast, and yogurt.  - Avoid alcohol while you are taking prescription pain medicine.  - Many people are constipated after surgery. This can be due to the pain medicine and a lack of activity. Be sure you get plenty of fluids, and take a fiber supplement such as methylcellulose (Citrucel) or psyllium (Metamucil) or a stool softener like docusate (Colace).      Medicine  -  Take pain medicine as needed, following the directions carefully. Do not wait until you are in severe pain. You will get better results if you take it sooner.  - Talk to your doctor or other health care professional before starting any new medicine, including an over-the-counter medicine.  - Do NOT take more than one pain medicine that contains acetaminophen (Tylenol) at the same time. Many over-the-counter medicines, as well as the commonly prescribed pain medicines hydrocodone with acetaminophen (Vicodin, Norco) and oxycodone and acetaminophen (Percocet), contain Tylenol. Too much Tylenol is dangerous. Check the labels carefully.  - To avoid an upset stomach, take your pain pills with food.  - You may resume taking all your home medications as previously prescribed.     Incision care  - You have skin glue over your incisions. This will come off on its own in 7-10 days. If some glue is still there after 2 weeks, it is ok to remove the glue.  - You may shower after the first 48 hours. Do not scrub your incision. You may clean it with plain warm water, and gently pat it dry.  - Keep the area clean and dry.  - Do not soak the incision under water during the first 2  weeks.  - You may have some swelling around your surgery site. This is normal and may take several weeks to go away.    You will follow up with your surgery team  at previously scheduled appointment.  An appointment will be scheduled, but you should call the office to verify.    When should you call 911?    If you think you are experiencing a medical emergency, call 911 immediately or seek other emergency services. Examples of symptoms that may be an emergency include:    - You pass out (lose consciousness).  - You have chest pain.    When should return to the Emergency Department?     - You are sick to your stomach or cannot keep fluids down.  - You have pain that does not get better after you take pain pills.  - You have a fever over 101 degrees.  - You  have signs of infection, such as increasing tenderness, red streaks, or pus from your incision.  - Your swelling is getting worse, and you have bright red bleeding from your incision.  - You have signs of a blood clot, such as unexplained pain or swelling in your leg.  - You have trouble passing urine or stool.  - You are not feeling better day by day.  - You have any problems with your medicine.    If you have any questions or concerns, please call your doctor or where you are receiving your care. After clinic hours, call the Page Operator and ask for the Surgical Resident on call.     [facility] Page Operator [phone]   Ask for the General Surgery Resident on call

## 2021-02-10 NOTE — Brief Op Note (Signed)
BRIEF OPERATIVE NOTE    DATE: 02/10/2021  TIME: 2:23 PM    PREOPERATIVE DIAGNOSIS: C-section scar lesion    POSTOPERATIVE DIAGNOSIS: subcutaneous mass at C-section    PROCEDURE INFORMATION:  Procedure(s):  Excision of subcutaneous mass from cesarean section scar - Wound Class: Class I (Clean) - Incision Closure: Deep and Superficial Layers    ATTENDING SURGEON:   Surgeon(s) and Role:     * Erin Fulling, MD - Primary     * Ricky Stabs, MD - Resident - Assisting    ANESTHESIA: local / MAC    FINDINGS: subcutaneous mass at C-section scar about 2cm in diameter excised with surrounding fatty tissue and sent for pathology. Mass did not extend into fascia, no other lesions identified. Closed with 3-0 vicryl and 4-0 monocryl with skin affix.    SPECIMENS:   ID Type Source Tests Collected by Time Destination   A : Endometrial Implant Tissue Abdomen PATHOLOGY TISSUE EXAM Erin Fulling, MD 02/10/2021 1343          Fluids/Blood Products:      IV Fluids: 600cc crystalloid    Blood Products: none    EBL: 3cc    Urine Output: not recorded    COMPLICATIONS: none    DISPOSITION: PACU, discharge home    Ricky Stabs, MD  General Surgery, PGY-4

## 2021-02-10 NOTE — Anesthesia Postprocedure Evaluation (Signed)
Anesthesia Post Note    Patient: Ruth Hall    Procedure(s) Performed: Procedure(s):  Excision of subcutaneous mass from cesarean section scar      Final anesthesia type: General    Patient location: PACU    Post anesthesia pain: adequate analgesia    Mental status: awake, alert  and oriented    Airway Patent: Yes    Last Vitals:   Vitals Value Taken Time   BP 120/70 02/10/21 1515   Temp 36.3 C 02/10/21 1500   Pulse 70 02/10/21 1515   Resp 21 02/10/21 1515   SpO2 100 % 02/10/21 1515        Post vital signs: stable    Hydration: adequate    N/V:no    Anesthetic complications: no    Plan of care per primary team.

## 2021-02-10 NOTE — H&P (Signed)
Pre-Operative Surgery History and Physical      Patient Name: Ruth Hall  MRN: [id number]  Room#: [facility] OR/Main OR        ID: Ruth Hall is a 35 year old presenting for Excision of subcutaneous mass from cesarean section scar     Patient previous seen 11/22/2020  "35 yo F with history of a low transverse cesarean section 17 years ago.  As it healed she noted a nodule in the middle of the scar - was initially told this was a keloid, later a cyst, and finally got a biopsy that demonstrated endometriosis.  The mass is painful with every menses; overall this has not gotten worse over the last 17 years, but she did have one menses a few cycles ago where it was particularly bad.  The last few have been a little better.  No endometriosis elsewhere that she knows of.  The mass does not visibly change with her menses, just hurts more.  No other major medical problems."     The risks and benefits of the procedure were discussed at that time and again today.     The patient notes no interval change in medical history and is not taking any new medications. Denies chest pain, shortness of breath, fevers, nausea, vomiting, and diarrhea.    She last ate yesterday 930pm and held all medications as instructed. She is nervous about recurrence, but eager to have it removed.     She notes a cold 3 weeks ago with a lingering cough (which happens to her frequently) and has tested negative for COVID in the past two weeks at work.    Patient Active Problem List    Diagnosis Date Noted   . Abdominal mass, unspecified abdominal location 11/22/2020     Added automatically from request for surgery [id number]     . Squamous blepharitis of both eyes 10/02/2020   . Chalazion left lower eyelid 10/02/2020   . Hordeolum of left eye 10/01/2020   . Inadequate exercise - not at goal 08/20/2020   . Herpes simplex infection of genitourinary system 08/19/2020   . BMI 37.0-37.9, adult 11/29/2014   . Anxiety 11/29/2014   . ASCUS  (atypical squamous cells of undetermined significance) on Pap smear 05/10/2010     05/05/10 Pap:  ASCUS, HR HPV positive  06/13/10 Pap:  [facility]  3/176/12 colpo:  Benign, ECC benign  12/23/10 Pap:  LSIL  01/09/11:  Pap (repeat) LSIL  01/09/11 colpo:  Benign endocervix with chronic inflammation  07/06/11 Pap:  'SIL, grade cannot be determined.'  HPV positive.  Comment: Previous cytology reviewed. Many LSIL cells and some smaller cells are seen worrisome for higher grade lesion.   09/11/11 colpo:  CIN-1, ECC negative  03/21/14: [facility]  2019 pap Bonnie, HPV negative   2022 pap [place], HPV negative   Repeat every 3 years for 25 years after the possible HSIL pap in 2013.         Past Medical History:   Diagnosis Date   . Blood transfusion declined because patient is Jehovah's Witness    . HPV in female    . Migraine    . Obesity        Past Surgical History:   Procedure Laterality Date   . CHOLECYSTECTOMY, LAP  2006   . c-section  2005         Current Facility-Administered Medications   Medication Dose Route Frequency Provider Last Rate Last Admin   .  lactated ringers infusion   IntraVENOUS Continuous Erin Fulling, MD       . lidocaine (XYLOCAINE) 1% PF injection 0.1 mL  0.1 mL IntraDERMAL Once PRN Erin Fulling, MD             Allergies   Allergen Reactions   . Penicillins Hives     Other reaction(s): Hives         Family History   Problem Relation Name Age of Onset   . Cancer Maternal Grandmother          GI cancer, also in 2 aunts   . No Known Problems Mother     . Cancer Father  36        lymphoma   . Diabetes Neg Hx     . Heart Disease Neg Hx     . Stroke Neg Hx     . Psychiatry Neg Hx     . Hypertension Neg Hx     . Thyroid Neg Hx     . No Known Cancer Neg Hx     . No Known Diabetes Neg Hx     . Other Neg Hx     . No Known Heart Disease Neg Hx         Social History     Socioeconomic History   . Marital status: Single     Spouse name: Not on file   . Number of children: Not on file   . Years of education: Not on  file   . Highest education level: High school graduate   Occupational History   . Occupation: receptionist   Tobacco Use   . Smoking status: Former   . Smokeless tobacco: Never   Substance and Sexual Activity   . Alcohol use: Yes     Alcohol/week: 5.0 - 7.5 standard drinks     Types: 2 - 3 Shots of liquor per week   . Drug use: No   . Sexual activity: Yes     Partners: Male     Birth control/protection: Pill     Comment: Ortho-Evra patch   Other Topics Concern   . Not on file   Social History Narrative    2019 - Lives with mom, 8yo son, and boyfriend.    Cats, dogs, [place] pig, fish     Social Determinants of Health     Financial Resource Strain: Low Risk    . Difficulty of Paying Living Expenses: Not very hard   Food Insecurity: No Food Insecurity   . Worried About Charity fundraiser in the Last Year: Never true   . Ran Out of Food in the Last Year: Never true   Transportation Needs: No Transportation Needs   . Lack of Transportation (Medical): No   . Lack of Transportation (Non-Medical): No   Physical Activity: Sufficiently Active   . Days of Exercise per Week: 3 days   . Minutes of Exercise per Session: 50 min   Stress: Not on file   Social Connections: Unknown   . Frequency of Communication with Friends and Family: Once a week   . Frequency of Social Gatherings with Friends and Family: Twice a week   . Attends Religious Services: Never   . Active Member of Clubs or Organizations: No   . Attends Archivist Meetings: Never   . Marital Status: Patient refused   Intimate Partner Violence: Not on file   Housing Stability: Low Risk    .  Unable to Pay for Housing in the Last Year: No   . Number of Places Lived in the Last Year: 1   . Unstable Housing in the Last Year: No       Review Of Systems  As noted in HPI/PMH      Physical Exam:   Vitals: BP 140/70 (BP Location: Left arm, BP Patient Position: Sitting)   Pulse 92   Temp 98.6 F (37 C)   Resp 16   Ht 5' (1.524 m)   Wt 82.6 kg (182 lb)   LMP  01/27/2021 (Approximate)   SpO2 100%   BMI 35.54 kg/m       General Description: Alert and oriented. Affect normal.     Chest and Lungs: No deformity.     Heart: Regular rate and rhythm.     Abdomen: Soft, non-distended, non-tender. Small hyperpigmented and violet colored nodule in right side of Pfannestiel incision, well healed.     Integument: No active lesion.    Neurologic: No focal deficit    Musculoskeletal: Moves all extremities without apparent deficit  Peripheral Vascular: extremities warm, well perfused    Recent Labs:  Lab Results   Component Value Date    NA 142 08/20/2020    K 4.6 08/20/2020    CL 106 08/20/2020    BICARB 25 08/20/2020    BUN 12 08/20/2020    CREAT 0.82 08/20/2020    GLU 102 (H) 08/20/2020    [place] 9.8 08/20/2020    AST 18 08/20/2020    ALT 23 08/20/2020    ALK 73 08/20/2020    TBILI 0.25 08/20/2020    ALB 4.4 08/20/2020    TP 8.0 08/20/2020       Lab Results   Component Value Date    WBC 5.4 03/12/2006    RBC 4.83 03/12/2006    HGB 14.2 03/12/2006    HCT 42.2 03/12/2006    MCV 87.5 03/12/2006    MCHC 33.6 03/12/2006    RDW 12.5 03/12/2006    PLT 264 03/12/2006    MPV Positive 07/06/2011    LYMPHS 30 03/12/2006    MONOS 7 03/12/2006    EOS 0 (L) 03/12/2006    BASOS 2 03/12/2006       Lab Results   Component Value Date    INR 1.1 05/06/2004    PTT 26.4 05/06/2004         Assessment  Preoperative history and physical completed for Consented Surgical Procedure.    PLAN  Proceed to OR for scheduled surgery  - site marked  - consent signed in clinic    Code Status: Full Code/Full Care

## 2021-02-12 ENCOUNTER — Telehealth (HOSPITAL_BASED_OUTPATIENT_CLINIC_OR_DEPARTMENT_OTHER): Payer: Self-pay | Admitting: Surgery

## 2021-02-12 NOTE — Telephone Encounter (Signed)
Pts husband is calling requesting a new return to work letter. States that her job is requesting a specific date be listed on the letter. Molli Knock 11/21 is the date that they would like added. Please assist    [phone]

## 2021-02-13 NOTE — Telephone Encounter (Signed)
Letter sent to patient with return date of 02/18/21 per patient.     [employer] sent to patient

## 2021-02-16 ENCOUNTER — Ambulatory Visit (INDEPENDENT_AMBULATORY_CARE_PROVIDER_SITE_OTHER): Payer: Self-pay | Admitting: Family Medicine

## 2021-02-17 ENCOUNTER — Other Ambulatory Visit: Payer: Self-pay

## 2021-02-17 ENCOUNTER — Encounter (INDEPENDENT_AMBULATORY_CARE_PROVIDER_SITE_OTHER): Payer: Self-pay | Admitting: Nurse Practitioner

## 2021-02-17 ENCOUNTER — Telehealth (INDEPENDENT_AMBULATORY_CARE_PROVIDER_SITE_OTHER): Payer: HMO | Admitting: Nurse Practitioner

## 2021-02-17 ENCOUNTER — Encounter (INDEPENDENT_AMBULATORY_CARE_PROVIDER_SITE_OTHER): Payer: Self-pay | Admitting: Family Medicine

## 2021-02-17 DIAGNOSIS — L299 Pruritus, unspecified: Secondary | ICD-10-CM

## 2021-02-17 DIAGNOSIS — R21 Rash and other nonspecific skin eruption: Secondary | ICD-10-CM

## 2021-02-17 DIAGNOSIS — F321 Major depressive disorder, single episode, moderate: Secondary | ICD-10-CM

## 2021-02-17 DIAGNOSIS — F419 Anxiety disorder, unspecified: Secondary | ICD-10-CM

## 2021-02-17 MED ORDER — TRIAMCINOLONE ACETONIDE 0.1 % EX CREA
1.0000 | TOPICAL_CREAM | Freq: Two times a day (BID) | CUTANEOUS | 0 refills | Status: DC
Start: 2021-02-17 — End: 2021-04-08

## 2021-02-17 MED ORDER — KETOCONAZOLE 2 % EX SHAM
1.0000 | MEDICATED_SHAMPOO | Freq: Once | CUTANEOUS | 0 refills | Status: DC
Start: 2021-02-17 — End: 2021-03-17

## 2021-02-17 NOTE — Telephone Encounter (Signed)
From: Luane School  To: Truddie Hidden, MD  Sent: 02/16/2021 9:35 AM PST  Subject: [place]     Hi Dr. Luvenia Heller,     Would you please be able to get me a referral again to see a therapy in person. I'm really having a hard time.     Thank you .   [name]

## 2021-02-17 NOTE — Telephone Encounter (Signed)
Can we pls call this pt to screen for symptoms?  Thank You  Florene Route- MA

## 2021-02-17 NOTE — Telephone Encounter (Signed)
Symptom Call         What symptom is the patient experiencing? Patient calling due to experiencing symptoms of depression and anxiety   Is this a new or ongoing symptom? ongoing  When did the symptom(s) begin? 3 months and been getting worse for the last month   Who is reporting the symptoms? Incoming call from patient    Insurance Coverage Verified: yes  Insurance Plan Name:    Assigned to [place]: yes  Next office visit:  Visit date not found  Did you offer Express Care/Urgent Care:  no    Transferred call to: Warm Symptoms Line (ext [phone])    Best way to contact patient: [phone] (mobile)   Alternative communication method: [phone] (mobile)

## 2021-02-17 NOTE — Patient Instructions (Addendum)
Anxiety and Depression:    1) Please call Behavioral Health for an appointment at: [phone]     2) As discussed, if you are not able to get in within a reasonable timeframe, please call your insurance company to see what other providers may be covered under your plan with earlier availability     3) Please follow up in 4-6 weeks    4) Follow up immediately or call 911 or [phone] if symptoms worsen    Rash:    1) Stop Hydrocortisone 2.5%    2) Start on the Triamcinolone as prescribed    3) Scalp: Trial Ketoconazole shampoo 2-3x/week as prescribed     4) If not improved in 2-4 weeks, follow up

## 2021-02-17 NOTE — Progress Notes (Signed)
FAMILY MEDICINE TELEMEDICINE PROGRESS NOTE    CC:  Depression    SUBJECTIVE:    Ruth Hall is a 35 year old female who is being seen for the following issues: Depression     ---------------------(data below generated by Urbano Heir, NP)--------------------    Patient Verification & Telemedicine Consent:    I am proceeding with this evaluation at the direct request of the patient.  I have verified this is the correct patient and have obtained verbal consent and written consent from the patient/ surrogate to perform this voluntary telemedicine evaluation (including obtaining history, performing examination and reviewing data provided by the patient).   The patient/ surrogate has the right to refuse this evaluation.  I have explained risks (including potential loss of confidentiality), benefits, alternatives, and the potential need for subsequent face to face care. Patient/ surrogate understands that there is a risk of medical inaccuracies given that our recommendations will be made based on reported data (and we must therefore assume this information is accurate).  Knowing that there is a risk that this information is not reported accurately, and that the telemedicine video, audio, or data feed may be incomplete, the patient agrees to proceed with evaluation and holds [place] harmless knowing these risks. In this evaluation, we will be providing recommendations only.  The ultimate decision to follow, or not follow, these recommendations will be left to the bedside treating/ requesting practitioner.  The patient/ surrogate has been notified that other healthcare professionals (including students, residents and Metallurgist) may be involved in this audio-video evaluation.   All laws concerning confidentiality and patient access to medical records and copies of medical records apply to telemedicine.  The patient/ surrogate has received the [place] Notice of Privacy Practices.  I have reviewed this above  verification and consent paragraph with the patient/ surrogate.  If the patient is not capacitated to understand the above, and no surrogate is available, since this is not an emergency evaluation, the visit will be rescheduled until such time that the patient can consent, or the surrogate is available to consent.    Demographics:   Medical Record #: [id number]   Date: February 17, 2021   Patient Name: Ruth Hall   DOB: January 10, 1986  Age: 35 year old  Sex: female  Location: Home address on file    Evaluator(s):   Chyler Creely was evaluated by me today.    Clinic Location:  [facility]  [address]  [place] [address]    HPI by Problem:     1) Depression x 3 Months: Patient reports that she has been experiencing worsening depression and anxiety over the past three months. Current stressors include: a recent eczema flare as well as endometriosis diagnosis which have kept her away from work. States that on (12/20/20), she saw Dr. Carlton Adam who prescribed Hydrocortisone 2.5% ointment for a potential eczema flare (psoriasis could not be ruled out). Instructed to follow up if not improved for potential biopsy or step up treatment. Has not followed up since that time, but is frustrated as she feels this treatment is not working, and "the rash is spreading." It is located to the extensor surfaces of her bilateral elbows, knees as well as scalp. Reports that her scalp is the worst, and now she has noticed dry patches of skin throughout. States that she was told by the last provider that this could be due to her stress. Also,  in terms of her endometriosis, she recently had surgery on (02/10/21) related to this for an excision of a subcutaneous mass from her C-section scare. Reports that work is a stress relief for her and she misses it. Feels like work keeps her mind occupied which is what she needs to not focus on the negativity of her health issues. Current mood  symptoms include: crying, feeling sad, and some lack of motivation. States that in the past, her PCP has placed her on medication for her mood (Celexa). Felt like it did help her since she improved. However, has not been on this in years. States that she has a good support system in her family. Has been out of work due to her surgery, but she will be returning tomorrow. Feels like she has a good support system via her family and friends. Ruth Hall is requesting a referral to therapy at this time, as she would like to start here prior to medication. Denies SI/HI.     Previously trialed medication: Celexa (did help patient), and stopped taking this since she got better. Previously stopped the Celexa years ago.     PHQ-2: 2    FM PHQ9 score 02/17/2021 08/20/2020 11/09/2018   PHQ9 Patient Summary Score (calculated) 12 10 10    Some recent data might be hidden     Last GAD7 score with date 02/17/2021 12/20/2020 08/13/2020 11/09/2018 04/01/2018   GAD7 Patient Total 6 6 7 18 7    Some recent data might be hidden     Review of Systems:   As per HPI and: - Constitutional: negative, fatigue, night sweats, weight loss, weight gain, malaise, anorexia, fever.  Eyes: negative.  Ears, Nose, Mouth, Throat: negative.  CV: negative.  Resp: negative.  GI: negative.  GU: negative.  Musculoskeletal: negative.  Integumentary: negative.  Neuro: negative.  Psych: depressed mood and anxiety.  Endo: negative.  Heme/Lymphatic: negative.  Allergy/Immun: negative.    Patient Active Problem List   Diagnosis   . ASCUS (atypical squamous cells of undetermined significance) on Pap smear   . BMI 37.0-37.9, adult   . Anxiety   . Herpes simplex infection of genitourinary system   . Inadequate exercise - not at goal   . Hordeolum of left eye   . Squamous blepharitis of both eyes   . Chalazion left lower eyelid   . Abdominal mass, unspecified abdominal location     Outpatient Medications Prior to Visit   Medication Sig Dispense Refill   . acetaminophen (TYLENOL) 500  MG tablet Take 2 tablets (1,000 mg) by mouth every 6 hours as needed for Mild Pain (Pain Score 1-3) or Moderate Pain (Pain Score 4-6). 100 tablet 0   . hydrocortisone 2.5 % ointment APPLY 1 APPLICATION TOPICALLY 2 TIMES DAILY. USE A SMALL AMOUNT AS DIRECTED FOR 2-4 WEEKS AND ASSESS FOR IMPROVEMENT 20 g 11   . ibuprofen (MOTRIN) 600 MG tablet Take 1 tablet (600 mg) by mouth every 6 hours as needed for Mild Pain (Pain Score 1-3) or Moderate Pain (Pain Score 4-6). 100 tablet 0   . OXYCODONE HCL PO      . phentermine 30 MG capsule Take 1 capsule (30 mg) by mouth every morning. 90 capsule 0   . topiramate (TOPAMAX) 25 MG tablet Take 1 tablet (25 mg) by mouth daily. Every evening 90 tablet 2     No facility-administered medications prior to visit.     OBJECTIVE:  Physical Exam: Exam conducted via [place]. Patient appears anxious, but  cooperative and in no acute distress at this time. Regular respiratory rate without accessory muscle use present. Speaking in full sentences without shortness of breath. No cyanosis or edema noted. Skin appears pink and dry. Rash (extensor surfaces of bilateral elbows and bilateral knees): Erythematous, slightly raised, dry and scale like with silvery scales at times - pruritic in nature. No drainage present. Scalp: (not visualized today): self reported dry, red and scale like patches with flaking - extremely pruritic in nature.     Psychiatric:   Appearance: well dressed, well groomed.  Speech: regular rate and rhythm  Mood: anxious  Affect: full range, congruent with mood  Thought Process: linear, goal directed  Appetite: no change  Sleep: less due to mind racing   Fatigue: yes  Agitation: none  Concentration: fair  Esteem:  good  Denies si/hi/ah/vh    PHQ-9: 12 (moderate)   GAD-7: 6 (mild)     LABS:  Results for orders placed or performed in visit on 10/24/20   HPV High Risk DNA Probe, Female   Result Value Ref Range    HPV High Risk Genotype 16 Not Detected Not Detected    HPV High Risk  Genotype 18 Not Detected Not Detected    HPV Other High Risk Genotypes (Not type 16 or 18) Not Detected Not Detected     ASSESSMENT & PLAN:  Ruth Hall is a 35 year old female was seen today for:  Diagnoses and all orders for this visit:    Anxiety  -     Consult/Referral to Integrated Behavioral Health    Current moderate episode of major depressive disorder without prior episode (CMS-HCC)  -     Consult/Referral to [place]    Scalp pruritus  -     ketoconazole (NIZORAL) 2 % shampoo; Apply 1 Application topically once for 1 dose.    Rash  -     triamcinolone (KENALOG) 0.1 % cream; Apply 1 Application topically 2 times daily. Apply a thin layer as directed    Anxiety and Depression:    1) Please call Behavioral Health for an appointment at: [phone]     2) As discussed, if you are not able to get in within a reasonable timeframe, please call your insurance company to see what other providers may be covered under your plan with earlier availability     3) Please follow up in 4-6 weeks    4) Follow up immediately or call 911 or [phone] if symptoms worsen    Rash:    1) Stop Hydrocortisone 2.5%    2) Start on the Triamcinolone as prescribed    3) Scalp: Trial Ketoconazole shampoo 2-3x/week as prescribed     4) If not improved in 2-4 weeks, follow up     Health Maintenance   Topic Date Due   . COVID-19 Vaccine (4 - Booster for Pfizer series) 06/21/2020   . PHQ9 Depression Monitoring doc flowsheet  12/21/2020   . Tetanus (5 - Tdap) 03/30/2021 (Originally 04/22/2006)   . Influenza (1) 09/26/2021 (Originally 10/28/2020)   . Cervical Cancer Screening  10/24/2025   . Polio Vaccine  Completed   . Hepatitis C Screening  Completed   . HPV Vaccine <= 26 Yrs  Aged Out   . Meningococcal MCV4 Vaccine  Aged Out   . Pneumococcal Vaccine  Aged Out     Return in about 4 weeks (around 03/17/2021).    Patient Instructions   Anxiety and Depression:  1) Please call Behavioral Health for an appointment at:  [phone]     2) As discussed, if you are not able to get in within a reasonable timeframe, please call your insurance company to see what other providers may be covered under your plan with earlier availability     3) Please follow up in 4-6 weeks    4) Follow up immediately or call 911 or [phone] if symptoms worsen    Rash:    1) Stop Hydrocortisone 2.5%    2) Start on the Triamcinolone as prescribed    3) Scalp: Trial Ketoconazole shampoo 2-3x/week as prescribed     4) If not improved in 2-4 weeks, follow up     Melchor Amour, FNP-BC    Plan discussed with pt including risks/benefits/alternatives including watchful waiting.  Informed pt of 24/7 on call MD.  ED if acutely worsening after hours.  Pt verbalized understanding.    Medications reviewed with patient and medication list reconciled.  Over the counter medications, herbal therapies and supplements reviewed.  Patient's understanding and response to medications assessed.    Barriers to medications assessed and addressed.  Risks, benefits, alternatives to medications reviewed.

## 2021-02-17 NOTE — Telephone Encounter (Signed)
Please call patient and determine symptoms patient is experiencing. Transfer to appropriate triage call queue. Thank you

## 2021-02-17 NOTE — Telephone Encounter (Signed)
PROVIDER ACTION REQUESTED: No, FYI only   Action item needed:  None    Appt scheduled:   Yes  Future Appointments   Date Time Provider [place]   02/17/2021  1:20 PM Urbano Heir, NP [place] [facility]   02/26/2021  1:20 PM Monna Fam, MD [facility]   08/04/2021 11:40 AM Tantisira, Tammi Klippel, MD [facility] [place] [facility]     COVID Testing Status: n/a   COVID TRIAGE PROTOCOL: n/a     Chief Complaint  Depression x 3 months  Assessment details:    Worsening depression and anxiety over last 3 months. Stressors: health (ezema, endometriosis) reports recent medical issues have kept her home away from work. Reports work is a stress relief for her and she misses it. Reports crying, feeling sad, and some lack of motivation. Reports in the past PCP placed her on rx medication but she didn't like it. Reports the medication made her feel drowsy. Pt requesting referral to therapy. Pt has supportive family. Denies SI/HI.     CLINIC/RN/LVN ACTION REQUEST: NO, FYI Only  Action item needed: NO, FYI Only    Advice given, Acute appt scheduled and pt given strict ED precautions and reviewed all applicable Home Care Advice per protocol.         COVID VACCINATION STATUS:   Current Covid Vaccinations   Administered Date(s) Administered   . COVID-19 Therapist, music) [employer] >= 12 Years 04/26/2020   . COVID-19 Therapist, music) [employer] >= 12 Years 05/19/2019, 06/09/2019     2023 Flu Vaccine Status: Yes     Reason for Call: Depression     Disposition: See PCP Within 24 Hours    Reason for Disposition  . [1] Depression AND [2] worsening (e.g.,sleeping poorly, less able to do activities of daily living)    Additional Information  . Negative: Patient attempted suicide  . Negative: Patient is threatening suicide now  . Negative: Violent behavior, or threatening to physically hurt or kill someone  . Negative: [1] Patient is very confused (disoriented, slurred speech) AND [2] no other adult (e.g., friend or family member)  available  . Negative: [1] Difficult to awaken or acting very confused (disoriented, slurred speech) AND [2] new-onset  . Negative: Sounds like a life-threatening emergency to the triager  . Negative: Suicide thoughts, threats, attempts, or questions  . Negative: Questions or concerns about alcohol use, unhealthy alcohol use, binge drinking, intoxication, or withdrawal  . Negative: Questions or concerns about substance use (drug use), unhealthy drug use, intoxication, or withdrawal  . Negative: Bipolar disorder (manic depression)  . Negative: Depression during the postpartum period (< 1 year since delivery)  . Negative: [1] Depression AND [2] unable to do any of normal activities (e.g., self care, school, work; in [place] to baseline).  . Negative: Very strange or confused behavior  . Negative: Patient sounds very sick or weak to the triager    Answer Assessment - Initial Assessment Questions  1. CONCERN: "What happened that made you call today?"      Worsening depression and anxiety over last 3 months. Stressor of health (ezema, endometriosis)    2. DEPRESSION SYMPTOM SCREENING: "How are you feeling overall?" (e.g., decreased energy, increased sleeping or difficulty sleeping, difficulty concentrating, feelings of sadness, guilt, hopelessness, or worthlessness)      Crying, feeling sad, lack of motivation    3. RISK OF HARM - SUICIDAL IDEATION:  "Do you ever have thoughts of hurting or killing  yourself?"  (e.g., yes, no, no but preoccupation with thoughts about death)    - INTENT:  "Do you have thoughts of hurting or killing yourself right NOW?" (e.g., yes, no, N/A)    - PLAN: "Do you have a specific plan for how you would do this?" (e.g., gun, knife, overdose, no plan, N/A)      No    4. RISK OF HARM - HOMICIDAL IDEATION:  "Do you ever have thoughts of hurting or killing someone else?"  (e.g., yes, no, no but preoccupation with thoughts about death)    - INTENT:  "Do you have thoughts of hurting or killing  someone right NOW?" (e.g., yes, no, N/A)    - PLAN: "Do you have a specific plan for how you would do this?" (e.g., gun, knife, no plan, N/A)       No    5. FUNCTIONAL IMPAIRMENT: "How have things been going for you overall? Have you had more difficulty than usual doing your normal daily activities?"  (e.g., better, same, worse; self-care, school, work, interactions)      Some    6. SUPPORT: "Who is with you now?" "Who do you live with?" "Do you have family or friends who you can talk to?"       Family    7. THERAPIST: "Do you have a counselor or therapist? Name?"      No    8. STRESSORS: "Has there been any new stress or recent changes in your life?"      Health    9. ALCOHOL USE OR SUBSTANCE USE (DRUG USE): "Do you drink alcohol or use any illegal drugs?"      No    10. OTHER: "Do you have any other physical symptoms right now?" (e.g., fever)        None    11. PREGNANCY: "Is there any chance you are pregnant?" "When was your last menstrual period?"        no    Protocols used: DEPRESSION-A-AH

## 2021-02-18 ENCOUNTER — Encounter (INDEPENDENT_AMBULATORY_CARE_PROVIDER_SITE_OTHER): Payer: Self-pay | Admitting: Hospital

## 2021-02-18 ENCOUNTER — Telehealth (INDEPENDENT_AMBULATORY_CARE_PROVIDER_SITE_OTHER): Payer: Self-pay

## 2021-02-18 NOTE — Telephone Encounter (Signed)
Returned patient's call to schedule new IBH visit. Discussed IBH, benefits and available providers. Added patient to the wait list. Provided 8-10 week wait. Patient is aware of the IBH number and offered to provide outside resources. Thank you.       Thank you,   Tasneem Cormier [place]   IBH Patient Navigator    [phone]

## 2021-02-25 ENCOUNTER — Telehealth (HOSPITAL_BASED_OUTPATIENT_CLINIC_OR_DEPARTMENT_OTHER): Payer: Self-pay | Admitting: Surgical Critical Care

## 2021-02-25 NOTE — Telephone Encounter (Signed)
11/30 apt confirmed

## 2021-02-26 ENCOUNTER — Encounter (HOSPITAL_BASED_OUTPATIENT_CLINIC_OR_DEPARTMENT_OTHER): Payer: Self-pay | Admitting: Surgical Critical Care

## 2021-02-26 ENCOUNTER — Ambulatory Visit: Payer: HMO

## 2021-02-26 VITALS — BP 146/94 | HR 103 | Temp 96.4°F | Resp 17

## 2021-02-26 DIAGNOSIS — Z09 Encounter for follow-up examination after completed treatment for conditions other than malignant neoplasm: Secondary | ICD-10-CM

## 2021-02-26 DIAGNOSIS — N806 Endometriosis in cutaneous scar: Secondary | ICD-10-CM | POA: Insufficient documentation

## 2021-02-26 NOTE — Progress Notes (Signed)
General Surgery Clinic Note    ID: Ruth Hall is a 35 year old presenting for excision of subcutaneous mass from cesarean section scar.    S:  Doing well post-operatively. Having some intermittent discomfort around the incision site. Continues to take tylenol for incisional pain. Eating well. Ambulating without issue. Having regular bowel movements. No issues with urinating. No fevers/chills.    O:  BP (!) 146/94 (BP Location: Right arm, BP Patient Position: Sitting, BP cuff size: Regular)   Pulse 103   Temp 96.4 F (35.8 C) (Temporal)   Resp 17   LMP 01/27/2021 (Approximate)   SpO2 99%   Gen: well appearing, sitting comfortably in chair  CV: RRR  P: Breathing comfortably on RA  Abd: Incision c/d/i with skin glue still in place  Ext: WWP    Pathology:  FINAL PATHOLOGIC DIAGNOSIS:   A: Abdominal mass, resection   -Subcutaneous endometriosis.   SPECIMEN(S) SUBMITTED:   A: Endometrial implant     A/P:  Ruth Hall is a 35 year old presenting for excision of subcutaneous mass from cesarean section scar. Doing well post-operatively.    - Doing well post-operatively  - No need for follow up in clinic      Discussed with attending    [facility] [facility] PGY-2

## 2021-03-04 ENCOUNTER — Other Ambulatory Visit: Payer: Self-pay

## 2021-03-04 NOTE — Telephone Encounter (Signed)
Population Health   S:  Ruth Hall is a 35 year old female who I have contacted regarding:   B:  PHQ Total: 12, related to FOLLOW UP ON elevated PHQ score.    A: Telephonic call placed to inform of eligibility Digital Behavioral Health Service.     [facility] 02/17/2021 02/17/2021 12/20/2020 10/24/2020 08/20/2020 11/09/2018 11/09/2018   PHQ2 Total 2 2 2  0 3 4 1    Some recent data might be hidden        Program overview reviewed with patient:    How to become eligible to participate   How to use the program services   How to opt in or out.   Review terms and conditions and confirmed patient accepts all texting related fees      P: Patient enrolled into digital behavioral health services. Call back number given for any further questions. PCP will be informed of patient's enrollment Digital Behavioral Health Program.    Past Medical History:   Diagnosis Date   . Blood transfusion declined because patient is Jehovah's Witness    . HPV in female    . Migraine    . Obesity       Current Outpatient Medications on File Prior to Visit   Medication Sig Dispense Refill   . acetaminophen (TYLENOL) 500 MG tablet Take 2 tablets (1,000 mg) by mouth every 6 hours as needed for Mild Pain (Pain Score 1-3) or Moderate Pain (Pain Score 4-6). 100 tablet 0   . hydrocortisone 2.5 % ointment APPLY 1 APPLICATION TOPICALLY 2 TIMES DAILY. USE A SMALL AMOUNT AS DIRECTED FOR 2-4 WEEKS AND ASSESS FOR IMPROVEMENT 20 g 11   . ibuprofen (MOTRIN) 600 MG tablet Take 1 tablet (600 mg) by mouth every 6 hours as needed for Mild Pain (Pain Score 1-3) or Moderate Pain (Pain Score 4-6). 100 tablet 0   . phentermine 30 MG capsule Take 1 capsule (30 mg) by mouth every morning. 90 capsule 0   . topiramate (TOPAMAX) 25 MG tablet Take 1 tablet (25 mg) by mouth daily. Every evening 90 tablet 2   . triamcinolone (KENALOG) 0.1 % cream Apply 1 Application topically 2 times daily. Apply a thin layer as directed 1 each 0     No current facility-administered  medications on file prior to visit.

## 2021-03-13 ENCOUNTER — Other Ambulatory Visit: Payer: Self-pay

## 2021-03-13 NOTE — Telephone Encounter (Signed)
Population Health Team [place] is a 35 year old female who I have contacted regarding: Follow up, Digital Behavioral Health Service.     Patient was outreached but unable to establish contact. Could not leave a voicemail with instructions to call back due to mailbox being full.

## 2021-03-17 ENCOUNTER — Other Ambulatory Visit (INDEPENDENT_AMBULATORY_CARE_PROVIDER_SITE_OTHER): Payer: Self-pay | Admitting: Nurse Practitioner

## 2021-03-17 DIAGNOSIS — L299 Pruritus, unspecified: Secondary | ICD-10-CM

## 2021-03-17 MED ORDER — KETOCONAZOLE 2 % EX SHAM
MEDICATED_SHAMPOO | CUTANEOUS | 1 refills | Status: DC
Start: 2021-03-17 — End: 2021-10-01

## 2021-03-17 NOTE — Telephone Encounter (Signed)
Established with:  Last OV with PCP:  Next OV with Dept:  Future Appointments: Truddie Hidden   02/17/2021  Visit date not found  Future Appointments   Date Time Provider [place]   08/04/2021 11:40 AM Tantisira, Tammi Klippel, MD [facility] [place] [facility]        Medication requested:   Requested Prescriptions     Pending Prescriptions Disp Refills   . ketoconazole (NIZORAL) 2 % shampoo [Pharmacy Med Name: KETOCONAZOLE 2% SHAMPOO] 120 mL      Sig: APPLY TOPICALLY 1 APPLICATION ONCE FOR 1 DOSE       Last Filled Date 02/17/21   Quantity Last Filled     Refill    Send to:    [id number] - [place], [place]  [address]  [place] [address]  Phone: [phone] Fax: [phone]      Current Medication(s):  Current Outpatient Medications   Medication Sig Dispense Refill   . acetaminophen (TYLENOL) 500 MG tablet Take 2 tablets (1,000 mg) by mouth every 6 hours as needed for Mild Pain (Pain Score 1-3) or Moderate Pain (Pain Score 4-6). 100 tablet 0   . hydrocortisone 2.5 % ointment APPLY 1 APPLICATION TOPICALLY 2 TIMES DAILY. USE A SMALL AMOUNT AS DIRECTED FOR 2-4 WEEKS AND ASSESS FOR IMPROVEMENT 20 g 11   . ibuprofen (MOTRIN) 600 MG tablet Take 1 tablet (600 mg) by mouth every 6 hours as needed for Mild Pain (Pain Score 1-3) or Moderate Pain (Pain Score 4-6). 100 tablet 0   . phentermine 30 MG capsule Take 1 capsule (30 mg) by mouth every morning. 90 capsule 0   . topiramate (TOPAMAX) 25 MG tablet Take 1 tablet (25 mg) by mouth daily. Every evening 90 tablet 2   . triamcinolone (KENALOG) 0.1 % cream Apply 1 Application topically 2 times daily. Apply a thin layer as directed 1 each 0     No current facility-administered medications for this visit.       Patient information: Allergies   Allergen Reactions   . Penicillins Hives     Other reaction(s): Hives      Health Maintenance Due   Topic Date Due   . COVID-19 Vaccine (4 - Booster for Pfizer series) 06/21/2020      Last labs:  Lab Results    Component Value Date    CHOL 159 08/30/2020    HDL 45 08/30/2020    LDLCALC 83 08/30/2020    TRIG 156 08/30/2020    TSH 1.13 08/20/2020    A1C 5.6 08/20/2020      Blood Pressure   02/26/21 (!) 146/94   02/10/21 120/70   02/04/21 122/78    No components found for: 80M

## 2021-03-17 NOTE — Telephone Encounter (Signed)
Nizoral  is not currently included in the Pharmacy Refill Clinic protocols. Re-routing to the responsible staff for processing.  Thank you

## 2021-04-07 NOTE — Progress Notes (Unsigned)
Ruth Hall is a 36yo G1P1001 presents with two concerns:    #. Increased vaginal discharge.  X 3 weeks  No itching, no burning.  Has not had intercourse since surgery 11/14  No douching.    #. Rash - had video visit 11/21.  Rx for ketoconazole  Triamcinolone helped, but ran out.  Wondering if rash is related to stress level.    PMH:  #. hx HPV  #. former smoker  #. genital herpes  #. BMI 35 - followed by Obesity Medicine, on phentermine and topiramax  #. Hx epression with anxiety  #. 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 Penicillin allergy    I reviewed the patient's medical record, including PMH, SH, medications, allergies, and ROS.    Vitals:    04/08/21 0955   BP: 122/78   Pulse: 72   Resp: 18   Temp: 97.8 F (36.6 C)   TempSrc: Temporal   SpO2: 99%   Weight: 82.6 kg (182 lb)   Height: 5' (1.524 m)     Physical Exam: General Appearance: healthy, alert, no distress, pleasant affect, cooperative.  Gyne: NEFG, Speculum - no discharge, no lesions  Skin:  Nonspecific dermatitis with small red macules/papules on extremities and scalp  Mental Status: Eye Contact: normal  Attention Span: good  Speech: normal volume, rate, and pitch  Mood (pt's report) :anxiety  Affect: full and appropriate  Suicidal Ideation: no  Homicidal Ideation: no    [name] 11/09/2018 08/20/2020 10/24/2020 12/20/2020 02/17/2021 02/17/2021 04/08/2021   Interest 2 0 0 0 1 1 1    Depressed 2 3 0 2 1 1 1    Sleep 1 3 -- -- -- 3 0   Energy 1 0 -- -- -- 3 1   Appetite 0 1 -- -- -- 0 1   Failure 2 3 -- -- -- 0 1   Concentration 2 0 -- -- -- 3 0   Movement 0 0 -- -- -- 1 1   Suicide -- 0 -- -- -- 0 1   Summary(Manual) -- -- -- -- -- -- --   Summary(Calculated) 10 10 -- -- -- 12 7   Functional Somewhat difficult Not difficult at all -- -- -- Somewhat difficult Somewhat difficult   Some recent data might be hidden     GAD 7 11/09/2018 11/09/2018 08/13/2020 12/20/2020 02/17/2021 04/08/2021   1. Feeling nervous,  anxious or on edge 3 - 1 0 1 1   2. Not being able to stop or control worrying 3 - 1 0 1 1   3. Worrying too much about different things 3 1 1 2 1 1    4. Trouble relaxing 3 1 1 2 1 1    5. Being so restless that it is hard to sit still 3 1 1  0 1 1   6. Being easily annoyed or irritable 3 3 1 2 1 3    7. Feeling afraid as if something awful might happen 0 0 1 0 0 1   GAD7 Patient Total 18 - 7 6 6 9    If you checked off any problems, how difficult have these problems made it for you to do your job along with other people? Somewhat difficult Somewhat difficult Very difficult Somewhat difficult Somewhat difficult Somewhat difficult   Some recent data might be hidden     ASSESSMENT/PLAN:    Ruth Hall was seen today for rash and discharge.    Diagnoses and  all orders for this visit:    Need for vaccination  -     Cancel: Tdap (Crozier or Adacel) for patients 57-72 years old    Depression with anxiety  -     escitalopram (LEXAPRO) 5 MG tablet; 1/2 tab po nightly x one week, then increase to one tab po qhs  -     Consult/Referral to Integrated Behavioral Health    Dermatitis  -     Consult/Referral to Dermatology Clinic - General Services    Rash  -     triamcinolone (KENALOG) 0.1 % cream; Apply 1 Application topically 2 times daily. Apply a thin layer as directed    Vaginal discharge  -     Vaginosis Screen BD Affirm Collection System

## 2021-04-08 ENCOUNTER — Encounter (INDEPENDENT_AMBULATORY_CARE_PROVIDER_SITE_OTHER): Payer: Self-pay | Admitting: Family Medicine

## 2021-04-08 ENCOUNTER — Encounter (INDEPENDENT_AMBULATORY_CARE_PROVIDER_SITE_OTHER): Payer: Self-pay

## 2021-04-08 ENCOUNTER — Other Ambulatory Visit: Payer: HMO | Attending: Family Medicine | Admitting: Family Medicine

## 2021-04-08 VITALS — BP 122/78 | HR 72 | Temp 97.8°F | Resp 18 | Ht 60.0 in | Wt 182.0 lb

## 2021-04-08 DIAGNOSIS — Z23 Encounter for immunization: Secondary | ICD-10-CM

## 2021-04-08 DIAGNOSIS — L309 Dermatitis, unspecified: Secondary | ICD-10-CM

## 2021-04-08 DIAGNOSIS — F418 Other specified anxiety disorders: Secondary | ICD-10-CM

## 2021-04-08 DIAGNOSIS — N898 Other specified noninflammatory disorders of vagina: Secondary | ICD-10-CM | POA: Insufficient documentation

## 2021-04-08 DIAGNOSIS — R21 Rash and other nonspecific skin eruption: Secondary | ICD-10-CM

## 2021-04-08 LAB — VAGINOSIS SCREEN
Candida, Vaginal Screen: NEGATIVE
Gardnerella, Vaginal Screen: POSITIVE — AB
Trichomonas, Vaginal Screen: NEGATIVE

## 2021-04-08 MED ORDER — ESCITALOPRAM OXALATE 5 MG OR TABS
ORAL_TABLET | ORAL | 1 refills | Status: DC
Start: 2021-04-08 — End: 2021-05-14

## 2021-04-08 MED ORDER — TRIAMCINOLONE ACETONIDE 0.1 % EX CREA
1.0000 | TOPICAL_CREAM | Freq: Two times a day (BID) | CUTANEOUS | 0 refills | Status: DC
Start: 2021-04-08 — End: 2021-10-01

## 2021-04-09 ENCOUNTER — Encounter (INDEPENDENT_AMBULATORY_CARE_PROVIDER_SITE_OTHER): Payer: Self-pay | Admitting: Family Medicine

## 2021-04-09 DIAGNOSIS — B9689 Other specified bacterial agents as the cause of diseases classified elsewhere: Secondary | ICD-10-CM

## 2021-04-09 MED ORDER — METRONIDAZOLE 500 MG OR TABS
500.0000 mg | ORAL_TABLET | Freq: Two times a day (BID) | ORAL | 0 refills | Status: DC
Start: 2021-04-09 — End: 2021-05-14

## 2021-04-16 ENCOUNTER — Telehealth (INDEPENDENT_AMBULATORY_CARE_PROVIDER_SITE_OTHER): Payer: Self-pay | Admitting: Clinical

## 2021-04-16 NOTE — Telephone Encounter (Signed)
Called patient to schedule new IBH visit. Discussed IBH, benefits and available providers. Scheduled patient on 4/50 with [facility] with a $20 copay. Instructed to complete the IBH questionnaires linked to the e-check in ahead of the visit time. She is aware IBH requires visit be completed in the state of [place], and to log on 15-20 minutes ahead to complete the e-check in. Thank you.       Thank you,   Josetta Huddle   [facility] Patient Navigator    [phone]

## 2021-04-17 ENCOUNTER — Encounter (INDEPENDENT_AMBULATORY_CARE_PROVIDER_SITE_OTHER): Payer: Self-pay | Admitting: Hospital

## 2021-04-22 NOTE — Progress Notes (Signed)
[place]   [place], [place] [address]  [phone]     Primary MD: Betha Loa A  Consult Requested By: Truddie Hidden      LAST CLINIC VISIT: 07/07/18 with Dr. 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      Chief complaint: dermatitis    HISTORY:  Jamiesha Victoria is a 36 year old female here for  dermatitis. Referred to Dermatology on 04/18/21.    History of rash:  - Has been using triamcinolone and recently request refill (11/21). Patient inquires if rash is related to stress.     Today, patient reports:   - Reports a rash for about 1 years. Has been prescribed creams, lotions and topicals with minimal improvement. Currently on the knees, elbows and scalp. Reports today she is not really flaring and is a good day. Denies family history of psoriasis. Denies previous history of dandruff. She used ketoconazole shampoo without improvement on the scalp. Currently uses lotion regularly (Baby lotion).     Otherwise no other itching, bleeding, painful, growing, ulcerating, or concerning lesions.    PAST DERM HISTORY:  Melanoma hx: negative   NMSC hx: negative   Prior skin pathology reports were reviewed and are summarized below:    Pathology review: 07/08/18   [id number]  A: midline lower abdomen  CUTANEOUS ENDOMETRIOSIS, ARISING IN ASSOCIATION WITH CSECTION  SCAR (N80.6)  Note: ADDENDUM @ 3:20pm: The anatomical site has been corrected for accuracy.  No diagnostic information or ICD10 coding has changed in this addendum.  There is no evidence of malignancy.    FAMILY HISTORY:  Family Hx of melanoma: negative   Family hx of nmsc negative .     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    She has no past medical history of Acute deep vein thrombosis of proximal leg (CMS-HCC), AF (atrial fibrillation) (CMS-HCC), Allergic rhinitis, Anemia, Asthma, Clotting disorder (CMS-HCC), Congestive heart failure (CMS-HCC), COPD (chronic obstructive pulmonary disease) (CMS-HCC), Diabetes mellitus (CMS-HCC), GERD (gastroesophageal reflux disease), Glaucoma, Heart disease, HIV disease (CMS-HCC), Hypercholesterolemia, Hypertension, Hypothyroidism, IBD (inflammatory bowel disease), Ischemic heart disease, Major depressive disorder, single episode, OSA (obstructive sleep apnea), Pneumonia, Polyarthropathy or polyarthritis of multiple sites, Seizures (CMS-HCC), or UTI (urinary tract infection).    MEDICATIONS:  Medications:   Current Outpatient Medications:   .  escitalopram (LEXAPRO) 5 MG tablet, 1/2 tab po nightly x one week, then increase to one tab po qhs, Disp: 30 tablet, Rfl: 1  .  ketoconazole (NIZORAL) 2 % shampoo, APPLY TOPICALLY 1 APPLICATION ONCE FOR 1 DOSE, Disp: 120 mL, Rfl: 1  .  metroNIDAZOLE (FLAGYL) 500 MG tablet, Take 1 tablet (500 mg) by mouth in the morning and at bedtime., Disp: 14 tablet, Rfl: 0  .  phentermine 30 MG capsule, Take 1 capsule (30 mg) by mouth every morning., Disp: 90 capsule, Rfl: 0  .  topiramate (TOPAMAX) 25 MG tablet, Take 1 tablet (25 mg) by mouth daily. Every evening, Disp: 90 tablet, Rfl: 2  .  triamcinolone (KENALOG) 0.1 % cream, Apply 1 Application topically 2 times daily. Apply a thin layer as directed, Disp:  45 g, Rfl: 0    Allergies: Penicillins    REVIEW OF SYSTEMS:  CONSTITUTIONAL: negative for fever or chills  DERMATOLOGIC: Feels well, no other skin complaints.    PHYSICAL EXAM:  Skin Type:                    2  General Appearance: within normal limits, WDWN  Neuro: Alert and oriented x 3  Psych: Mood and affect within normal limits  Eyes: Inspection of lids, sclera, and conjunctiva within normal limits     Skin: The areas examined included  scalp and face, neck, RUE, LUE, chest, abdomen, back, RLE, LLE, buttock     Pertinent findings below:  - Mild erythema of posterior occipital scalp   - Very thin scaly plaques on bilateral elbows  - Knees with erythematous to hyperpigmented patches     ASSESSMENT AND TREATMENT PLAN:    Dermatitis, most likely psoriasis given distribution but today is a good day and the active areas are very mild  Diagnoses, natural course, treatments and risks were discussed with the patient   - Discussed etiology of conditions. Reviewed effects of diet.   - Recommend lotions or creams with ammonium lactate or salicylic acid and regular application.  - For the bilateral elbows and knees, when flaring can use triamcinolone 0.1% cream twice daily for 5 days (M-F). Please take breaks on weekends. Patient already has rx.   - For the bilateral arms and knees, please start calcipotriene 0.005% ointment  twice daily only on the weekends scheduled as ppx  - For the scalp, can continue ketoconazole shampoo as directed 2-3 times weekly. Please let sit for 5-10 minutes. Patient already has prescription.   - start Clobetasol 0.05% solution, applying to the scalp twice daily for 1-2 weeks. Please use a small amount. Please stop use after two weeks.   -- Side effects of topical steroids include atrophy, telangiectasias, acne, and dyspigmentation.  Note: If applied on eyelids, note long term risk of steroid application near the eyes: cataracts and glaucoma.  - We discussed the systemic findings in psoriasis as well including association with metabolic syndrome and arthritis; pt to follow up with PCP regularly and let us know if she develops joint pain          Patient was instructed to make this follow-up appointment before leaving clinic today  F/u 4-5 months dermatitis        Alto Denver MD      Scribe Attestation  The notes I am recording reflect only actions made by and judgments taken by this provider, Dr. Posey Pronto, for whom I am scribing today.   I have performed no independent clinical work.    Daleen Bo Guenin    ____________________________________________________________________    Provider Attestation for Scribed Note    As the attending provider, I agree with the scribed content.  Any changes or edits are noted in the text above.     Jara Feider Rajul Posey Pronto

## 2021-04-25 ENCOUNTER — Other Ambulatory Visit: Payer: Self-pay

## 2021-04-25 ENCOUNTER — Telehealth (INDEPENDENT_AMBULATORY_CARE_PROVIDER_SITE_OTHER): Payer: 59 | Admitting: Clinical

## 2021-04-25 DIAGNOSIS — F4320 Adjustment disorder, unspecified: Secondary | ICD-10-CM

## 2021-04-28 ENCOUNTER — Other Ambulatory Visit: Payer: Self-pay

## 2021-04-28 NOTE — Telephone Encounter (Signed)
Population Health Team Ruth Hall is a 36 year old female who I have contacted regarding: Follow up, Digital Behavioral Health Service, [email or web address] Coaching.     Patient was outreached but unable to establish contact. Could not leave a voicemail with instructions to call back due to mailbox being full.

## 2021-04-29 ENCOUNTER — Telehealth (INDEPENDENT_AMBULATORY_CARE_PROVIDER_SITE_OTHER): Payer: HMO | Admitting: Family Medicine

## 2021-04-29 ENCOUNTER — Encounter (INDEPENDENT_AMBULATORY_CARE_PROVIDER_SITE_OTHER): Payer: Self-pay

## 2021-04-29 ENCOUNTER — Ambulatory Visit (INDEPENDENT_AMBULATORY_CARE_PROVIDER_SITE_OTHER): Payer: HMO | Admitting: Dermatology

## 2021-04-29 DIAGNOSIS — L309 Dermatitis, unspecified: Secondary | ICD-10-CM

## 2021-04-29 MED ORDER — CLOBETASOL PROPIONATE 0.05 % EX SOLN
CUTANEOUS | 1 refills | Status: DC
Start: 2021-04-29 — End: 2021-07-18

## 2021-04-29 MED ORDER — CLOBETASOL PROPIONATE 0.05 % EX CREA
TOPICAL_CREAM | CUTANEOUS | 1 refills | Status: DC
Start: 2021-04-29 — End: 2021-04-29

## 2021-04-29 MED ORDER — CALCIPOTRIENE 0.005 % EX OINT
TOPICAL_OINTMENT | CUTANEOUS | 5 refills | Status: AC
Start: 2021-04-29 — End: ?

## 2021-04-29 NOTE — Progress Notes (Signed)
Progress Notes  Length of Session: 50 Minutes  This was session number : 1  For therapy type : Individual  Treatment Model used was: Assesment  Mental/Functional Status  Mood was : Anxious  Affect was: Appropriate  Speech was: Clear/normal  Thought process was: Coherent  Auditory hallucinations reported: No  Visual hallucinations reported: No  PHQ-9 Respondent Score  PHQ9 Patient Summary Score (calculated): 11  GAD-7 Respondent Score  GAD7 Patient Total: 7              ---------------------(data below generated by Titus Mould, PSY.D.)--------------------     Patient Verification & Telemedicine Consent & Financial Waiver:    1.   Identity: I have verified this patient's identity to be accurate.  2.   Consent: I verify consent has been secured in one of the following methods: (a) obtained written/ online attestation consent (via MyChartVideoVisit pathway), (b) the spoke-side provider has obtained verbal or written consent from patient/surrogate (if this is a "provider to provider" evaluation), or (c) in all other cases, I have personally obtained verbal consent from the patient/ surrogate (noting all elements below) to perform this voluntary telemedicine evaluation (including obtaining history, performing examination and reviewing data provided by the patient).   The patient/ surrogate has the right to refuse this evaluation.  I have explained risks (including potential loss of confidentiality), benefits, alternatives, and the potential need for subsequent face to face care. Patient/ surrogate understands that there is a risk of medical inaccuracies given that our recommendations will be made based on reported data (and we must therefore assume this information is accurate).  Knowing that there is a risk that this information is not reported accurately, and that the telemedicine video, audio, or data feed may be incomplete, the patient agrees to proceed with evaluation and holds [place] harmless knowing these  risks.  3.   Healthcare Team: The patient/ surrogate has been notified that other healthcare professionals (including students, residents and Metallurgist) may be involved in this audio-video evaluation.   All laws concerning confidentiality and patient access to medical records and copies of medical records apply to telemedicine.  4.   Privacy: If this is a Radiographer, therapeutic Visit, the patient/ surrogate has received the [place] Notice of Privacy Practices via E-Checkin process.  For all other video visit techniques, I have verbally provided the patient/ surrogate with the [place] in [place] (https://[email or web address].[email or web address]) or Spanish (https://health.https://www.matthews.info/.aspx).  The patient/ surrogate acknowledges both being provided the NPP link, and has been offered to have the NPP mailed to the patient/ surrogate by [place] mail.  The patient/ surrogate has voiced understanding an acknowledgement of receipt of this NPP web address.  If the patient/surrogate has elected to receive the NPP via [place] mail, I verify that the NPP will be sent promptly to the patient/surrogate via [place] mail.  5.   Capacity: I have reviewed this above verification and consent paragraph with the patient/ surrogate and the patient is capacitated or has a surrogate. If the patient is not capacitated to understand the above, and no surrogate is available, since this is not an emergency evaluation, the visit will be rescheduled until such time that the patient can consent, or the surrogate is available to consent. If this is an emergency evaluation and the patient is not capacitated to understand the above, and no surrogate is available, I am proceeding with this evaluation as this is felt to be an emergency setting and no appropriate specialist is available at  the bedside to perform these evaluations.  6.   Financial Waiver: If this is a Radiographer, therapeutic Visit, the patient has been made aware of the financial waiver via  E-Checkin process.  For all other video visit techniques, an E-Checkin process is not performed.  As such, I have personally verbally informed the patient/ surrogate that this evaluation will be a billable encounter similar to an in-person clinic visit, and the patient/ surrogate has agreed to pay the fee for services rendered.  If we are billing insurance for the patient's telehealth visit, her out-of-pocket cost will be determined based on her plan and will be billed to her.    7.   Intra-State Location: The patient/ surrogate attests to understanding that if the patient accesses these services from a location outside of [place], that the patient does so at the patient's own risk and initiative and that the patient is ultimately responsible for compliance with any laws or regulations associated with the patient's use.  8.   Specific Use:The patient/ surrogate understands that [facility] makes no representation that materials or servicesdelivered via telecommunication services, or listed on telemedicine websites, are appropriate or available for use in any other location.           Demographics:  Medical Record #: [id number]  Date: April 25, 2021   Patient Name: Ruth Hall  DOB: 04/20/1985  Age: 36 year old  Sex: female  Location: [address] [place], [place] [address]  Patient seen Status: Seen     Evaluator(s):  Babs Dabbs was evaluated by me today.    Clinic Location: [facility]  [place] [address]    This note was co-written by trainee Migdalia Dk and supervising clinical psychologist, Dr. Maxie Barb, Psy.D.     Pt presented for initial session with trainee Migdalia Dk and supervising Clinical Psychologist,Dr. Maxie Barb, Psy.D. Initial session included discussion of consent form, telehealth consent, discussion ofco-therapy model facilitated by trainee and supervising psychologist, and related material i.e., confidentiality  and limits thereof, mandatory reporting laws, fees for counseling, cancellation policies, and therapist contact information. Pt provided verbal consent.     Presenting Problem: Pt was referred to [facility] by her PCP for depression with anxiety.     Mental Status: Pt arrived on-time for initial scheduled IBH appointment via [place]. Pt appeared well groomed and dressed in a casual long sleeve top. Pt presented in an anxious mood with congruent affect. Pt displayed a coherent linear thought process with normal rate of speech and volume. Pt maintained normal level of eye contact. Pt was oriented x4. Pt denied SI/HI/AH/VH.     Goal(s) for treatment: TBD    Description: Pt is a 36 year old, [place], cisgender female, whom uses she/her/hers pronouns. PT reports presenting with issues of depression and anxiety. Pt identified experiencing depression symptoms of lack of interest in previously enjoyable activities (hiking, work, shopping), low motivation/energy, low self-esteem, episodes of crying, increased appetite for comfort, isolation, and passive thoughts of suicide. Pt denied plan and intent. Pt identified anxiety is manifested through feeling overwhelmed, irritable, racing thoughts, excessive worries, and restlessness.  Pt reported symptom onset was approximately two years ago during Covid-19 pandemic and work related stressors.     PROMIS ADULT SHORT FORM V1.0 GLOBAL HEALTH    Question 04/25/2021 9:53 AM PST - Filed by Patient   1. In general, would you say your health is: /  En general, dira que su salud es: Fair/Pasable   2. In general, would you say your quality of life is: / En general, dira que su calidad de vida es: Fair/Pasable   3. In general, how would you rate your physical health? / En general, cmo calificara su salud fsica? Poor/Mal   4. In general, how would you rate your mental health, including your mood and your ability to think? / En general, cmo calificara su salud mental,  incluyendo su estado de nimo y su capacidad para pensar? Poor/Mal   5. In general, how would you rate your satisfaction with your social activities and relationships? / En general, cmo calificara su satisfaccin con sus actividades sociales y sus relaciones con Person? Poor/Mal   6. To what extent are you able to carry out your everyday physical activities such as walking, climbing stairs, carrying groceries, or moving a chair? / En qu medida puede realizar sus actividades fsicas diarias, como caminar, subir escaleras, cargar las compras o mover una silla? Mostly/En su mayora   7. In general, please rate how well you carry out your usual social activities and roles. (This includes activities at home, at work and in Charity fundraiser, and responsibilities as a parent, child, spouse, employee, friend, etc.) / En general, califique en qu medida puede realizar sus actividades sociales y funciones habituales. (Ingenio, en el trabajo y en el rea donde reside, as como sus responsabilidades como padre o madre, hijo/a, cnyuge, empleado/a, amigo/a, etc.)  Good/Bien   In the past 7 days / En los ultimos 7 dias    8. How would you rate your pain on average? / En promedio, cmo calificara su dolor? 0   9. How would you rate your fatigue on average? / En promedio, cmo calificara su cansancio? Moderate/Moderado   10. How often have you been bothered by emotional problems such as feeling anxious, depressed or irritable? / Con qu frecuencia le han afectado problemas emocionales como sentir ansiedad, depresin o irritabilidad?  Often/Menudo/Muchas veces   PROMIS Adult Short Form-Global Health Score (Physical) (range: 16 - 68) 42.3   PROMIS Adult Short Form-Global Health Score (Mental) (range: 21 - 68) 28.4 (Needs Intervention)Critical     [facility] MYC COLLAB CARE INTAKE    Question 04/25/2021 10:00 AM PST - Filed by Patient   What is your current relationship status: Cohabitating    Which category best describes your race? (One or more categories may be marked) Declined   What is your highest level of education College Year - 1   Physically dependent adult (e.g., aging parent)? No   Child with a chronic illness (e.g. diabetes)? No   Have you ever received outpatient counseling/psychotherapy services? Yes   If yes, When and Where? [facility]   Are you currently seeing a psychiatrist? No   If yes, Who and Where?    Have you ever been hospitalized for psychiatric/mental health reasons? No   If yes, When and Where?    Have you ever had thoughts of harming yourself or someone else? Yes   If yes, When Years ago   Have you ever attempted suicide or intentionally severely hurt yourself or someone else? Yes   If yes, When Years ago on myself   Problem: Anxiety,   Rating 7   Problem: Depressed   Rating 7   Problem: Stress   Rating 7   Now thinking about the past 30 days, how many days did these  concerns keep you from doing your usual activities, such as self-care, work/school, interaction with friends/loved ones, or recreation? (0-30) 8   How much do your concerns interfere with your Work/Academic Performance 3 - Moderately   How much do your concerns interfere with your Emotional Well-being 4 - Significantly   How much do your concerns interfere with your Social Relationships/Activities 4 - Significantly   In general how happy were you growing up? Very   How satisfied are you with the level of help and support received from important people. Satisfied   How much do you agree with the following statement: I have a good sense of what makes my life meaningful. Neutral   Sometimes things happen to people that are extremely upsetting--things like being in a life threatening situation like a major disaster, very serious accident or fire; being physically assaulted, or threatened with a weapon; being physically forced to have sex, or having your private body parts touched against your wishes; seeing another  person killed or dead, or badly hurt, or hearing about something horrible that has happened to someone you are close to. At any time during your life, have any of these kinds of things happened to you? No   If "yes", do any of these experiences still bother or affect your life today in anyway? No   How would you rate your quality of sleep? Poor   How much caffeine (i.e. coffee, colas, energy drinks) do you drink each day on average? 25-60oz or 3-5 cups/cans   Do you currently smoke cigarettes? No   If "yes", how many cigarettes do you smoke per day? (1-100)    How many alcoholic beverages (beer/wine/liquor) do you drink per week on average? (0-100)    When was the last time you had more than 4 drinks (women) or 5 drinks (men) in one day? Never   In addition to the substances mentioned above, when was the last time you used any drug including marijuana and/or prescription drugs that were not prescribed by a doctor? Never   How many times a week, do you usually do 20 minutes of vigorous physical activity that makes you sweat or puff and pant? (for example, jogging, heavy lifting, digging, aerobics, or fast bicycling) 1-2 times a week   How many times a week, do you usually do 30 minutes of moderate physical activity or walking that increases your heart rate or makes you breath harder than normal? (for example, mowing the lawn, carrying light loads, bicycling at a regular pace, or playing doubles tennis) 1-2 times a week   How many hours a day do you spend watching TV, on the computer, or playing video games? 1-2 times a week   How much do you agree that spirituality/meditation/religion is an important part of your life. Strongly Disagree   Would you like to integrate your religion/spirituality, cultural, or alternative healing practices into treatment? No   How?      [facility] MYC GAD-7    Question 04/25/2021 10:01 AM PST - Filed by Patient   Over the last 2 weeks, how often have you been bothered by the following problems?     Feeling nervous, anxious or on edge Several days   Not being able to stop or control worrying Several days    Worrying too much about different things Several days    Trouble relaxing Several days    Being so restless that it is hard to sit still Several days  Being easily annoyed or irritable Several days   Feeling afraid as if something awful might happen Several days   If you checked off any problems, how difficult have these problems made it for you to do your job along with other people? Somewhat difficult   GAD-7 Total (range: 0 - 21) 7     MYCHART PHQ-9    Question 04/25/2021 10:02 AM PST - Filed by Patient   Over the last 2 weeks, how often have you been bothered by any of the following problems?    Little interest or pleasure in doing things More than half the days   Feeling down, depressed, or hopeless Several days   Trouble falling or staying asleep, or sleeping too much? Several days   Feeling tired or having little energy Several days   Poor appetite or overeating More than half the days   Feeling bad about yourself--or that you are a failure to have let yourself or your family down Several days   Trouble concentrating on things, such as reading the newspaper or watching television Several days   Moving or speaking so slowly that other people could have noticed. Or the opposite--being so fidgety or restless that you have been moving around a lot more than usual Not at all   Thoughts that you would be better off dead, or of hurting yourself in some way More than half the daysAbnormal   If you checked off any problems, how difficult have these problems made it for you to do your work, take care of things at home, or get along with other people? Somewhat difficult   PHQ-9 Severity Score (range: 0 - 27) 7 (minimal symptoms)Abnormal     [facility] MYC AMB AUDIT OPT    Question 04/25/2021 10:03 AM PST - Filed by Patient   1. How often do you have a drink containing alcohol? Never   2. How many drinks  containing alcohol do you have on a typical day when you are drinking? 0-2   3. How often do you have four or more drinks on one occasion? Never   4. How often during the last year have you found that you were not able to stop drinking once you had started? Never   5. How often during the last year have you failed to do what was normally expected of you because of drinking? Never   6. How often during the last year have you needed a first drink in the morning to get yourself going after a heavy drinking session? Never   7. How often during the last year have you had a feeling of guilt or remorse after drinking? Never   8. How often during the last year have you been unable to remember what happened the night before because of your drinking? Never   9. Have you or someone else been injured because of your drinking? No   10. Has a relative, friend, doctor, or other health care worker been concerned about your drinking or suggested you cut down? No   11. Have you ever been in treatment for an alcohol problem? Never     [facility] MYC AMB DAST OPT    Question 04/25/2021 10:03 AM PST - Filed by Patient   1. Have you used drugs other than those required for medical reasons? No   2. Do you abuse more than one drug at a time? No   3. Are you unable to stop using drugs when you want  to? No   4. Have you ever had blackouts or flashbacks as a result of drug use? No   5. Do you ever feel bad or guilty about your drug use? No   6. Does your spouse (or parents) ever complain about your involvement with drugs? No   7. Have you neglected your family because of your use of drugs? No   8. Have you engaged in illegal activities in order to obtain drugs? No   9. Have you ever experienced withdrawal symptoms (felt sick) when you stopped taking drugs? No   10. Have you had medical problems as a result of your drug use (e.g. memory loss, hepatitis, convulsions, bleeding)? No   Have you ever injected drugs? Never   Have you ever been in treatment  for substance abuse? Never         Assessment/Action: Administrator, sports reviewed chart and intake questionnaires. Therapist's and trainee's intervention primarily focused on rapport building, empathic listening, and gathering history of presenting problem. Therapist assessed for risk for self-harm and harm to others. Therapist provided Pt with information regarding [facility] focused on short-term goal-oriented treatment with treatment duration approximately 12-sessions. Therapist informed Pt of initial sessions focused on assessment, followed by goal-setting and treatment planning.       Response: Pt was receptive to intervention and consented to [facility] focused on assessment, goal setting, and collaborative treatment planning with an expectation and treatment duration of approximately 12 session. Pt denied SI/HI. Pt stated "I'm excited" and expressed motivation to engage in treatment.       Plan: Pt scheduled second session for Friday February 3 at 10 am via [place]. Future session will focus on further assessment needed for diagnosis, case conceptualization, and treatment planning purposes.     Treatment Plan: TBD

## 2021-04-29 NOTE — Patient Instructions (Addendum)
-   Recommend lotions or creams with ammonium lactate or salicylic acid regularly to the elbows and knees  - For the bilateral elbows and knees, when flaring can use triamcinolone 0.1% cream twice daily for 5 days (M-F). Please take breaks on weekends. Already has rx.   - For the bilateral arms and knees, please start calcipotriene 0.005% ointment twice daily only on the weekends scheduled to prevent flares  - For the scalp, can continue ketoconazole shampoo as directed 2-3 times weekly. Please let sit for 5-10 minutes. Patient already has prescription.   - For the scalp, start clobetasol 0.05% solution, applying to the scalp twice daily for 1-2 weeks. Please use a small amount. Please stop use after two weeks.   -- Side effects of topical steroids include atrophy, telangiectasias, acne, and dyspigmentation.  Note: If applied on eyelids, note long term risk of steroid application near the eyes: cataracts and glaucoma.

## 2021-05-02 ENCOUNTER — Other Ambulatory Visit: Payer: Self-pay

## 2021-05-02 ENCOUNTER — Telehealth (INDEPENDENT_AMBULATORY_CARE_PROVIDER_SITE_OTHER): Payer: 59 | Admitting: Clinical

## 2021-05-02 DIAGNOSIS — F4320 Adjustment disorder, unspecified: Secondary | ICD-10-CM

## 2021-05-09 ENCOUNTER — Telehealth (INDEPENDENT_AMBULATORY_CARE_PROVIDER_SITE_OTHER): Payer: 59 | Admitting: Clinical

## 2021-05-09 ENCOUNTER — Other Ambulatory Visit: Payer: Self-pay

## 2021-05-09 DIAGNOSIS — F4323 Adjustment disorder with mixed anxiety and depressed mood: Secondary | ICD-10-CM

## 2021-05-09 NOTE — Progress Notes (Signed)
Progress Notes  Length of Session: 70 Minutes  This was session number : 2  For therapy type : Individual  Treatment Model used was: Assesment  Mental/Functional Status  Mood was : Anxious  Affect was: Appropriate  Speech was: Clear/normal  Thought process was: Coherent  Auditory hallucinations reported: No  Visual hallucinations reported: No                    ---------------------(data below generated by Titus Mould, PSY.D.)--------------------     Patient Verification & Telemedicine Consent & Financial Waiver:    1.   Identity: I have verified this patient's identity to be accurate.  2.   Consent: I verify consent has been secured in one of the following methods: (a) obtained written/ online attestation consent (via MyChartVideoVisit pathway), (b) the spoke-side provider has obtained verbal or written consent from patient/surrogate (if this is a "provider to provider" evaluation), or (c) in all other cases, I have personally obtained verbal consent from the patient/ surrogate (noting all elements below) to perform this voluntary telemedicine evaluation (including obtaining history, performing examination and reviewing data provided by the patient).   The patient/ surrogate has the right to refuse this evaluation.  I have explained risks (including potential loss of confidentiality), benefits, alternatives, and the potential need for subsequent face to face care. Patient/ surrogate understands that there is a risk of medical inaccuracies given that our recommendations will be made based on reported data (and we must therefore assume this information is accurate).  Knowing that there is a risk that this information is not reported accurately, and that the telemedicine video, audio, or data feed may be incomplete, the patient agrees to proceed with evaluation and holds [place] harmless knowing these risks.  3.   Healthcare Team: The patient/ surrogate has been notified that other healthcare professionals  (including students, residents and Metallurgist) may be involved in this audio-video evaluation.   All laws concerning confidentiality and patient access to medical records and copies of medical records apply to telemedicine.  4.   Privacy: If this is a Radiographer, therapeutic Visit, the patient/ surrogate has received the [place] Notice of Privacy Practices via E-Checkin process.  For all other video visit techniques, I have verbally provided the patient/ surrogate with the [place] in [place] (https://[email or web address].se.aspx) or Spanish (https://[email or web address]://www.matthews.info/.aspx).  The patient/ surrogate acknowledges both being provided the NPP link, and has been offered to have the NPP mailed to the patient/ surrogate by [place] mail.  The patient/ surrogate has voiced understanding an acknowledgement of receipt of this NPP web address.  If the patient/surrogate has elected to receive the NPP via [place] mail, I verify that the NPP will be sent promptly to the patient/surrogate via [place] mail.  5.   Capacity: I have reviewed this above verification and consent paragraph with the patient/ surrogate and the patient is capacitated or has a surrogate. If the patient is not capacitated to understand the above, and no surrogate is available, since this is not an emergency evaluation, the visit will be rescheduled until such time that the patient can consent, or the surrogate is available to consent. If this is an emergency evaluation and the patient is not capacitated to understand the above, and no surrogate is available, I am proceeding with this evaluation as this is felt to be an emergency setting and no appropriate specialist is available at the bedside to perform these evaluations.  6.   Financial Waiver: If this  is a Radiographer, therapeutic Visit, the patient has been made aware of the financial waiver via E-Checkin process.  For all other video visit techniques, an E-Checkin process is not performed.  As such, I  have personally verbally informed the patient/ surrogate that this evaluation will be a billable encounter similar to an in-person clinic visit, and the patient/ surrogate has agreed to pay the fee for services rendered.  If we are billing insurance for the patient's telehealth visit, her out-of-pocket cost will be determined based on her plan and will be billed to her.   7.   Intra-State Location: The patient/ surrogate attests to understanding that if the patient accesses these services from a location outside of [place], that the patient does so at the patient's own risk and initiative and that the patient is ultimately responsible for compliance with any laws or regulations associated with the patient's use.  8.   Specific Use:The patient/ surrogate understands that [place] makes no representation that materials or servicesdelivered via telecommunication services, or listed on telemedicine websites, are appropriate or available for use in any other location.           Demographics:  Medical Record #: [id number]  Date: May 01, 2021   Patient Name: Ruth Hall  DOB: [birth year]  Age: 36 year old  Sex: female  Location: Home address on file  Patient seen Status: Seen     Evaluator(s):  Ebonie Westerlund was evaluated by me today.    Clinic Location: [facility]  [place] [address]    This note was co-written by trainee Migdalia Dk and supervising clinical psychologist, Dr. Maxie Barb, Psy.D.     Presenting Problem: Pt was referred to [facility] by her PCP for depression with anxiety.     Mental Status: Pt arrived on-time for second scheduled IBH appointment via [place]. Pt attended session at her home, sitting upright in bed. Pt appeared well groomed and dressed long sleeve sweater. Pt presented in an anxious mood with congruent affect. Pt displayed a coherent linear thought process with normal rate of speech and volume.  Pt maintained normal level of eye contact. Pt was oriented x4. Pt denied SI/HI/AH/VH.     Goal(s) for treatment: TBD     Description: Pt reported continued symptoms of depression and anxiety and processed her experience going to [place] with her friend in which she experienced anxiety and urges to want to leave.     Assessment/Action: Therapist's and trainee's intervention primarily focused on continued rapport building, empathic listening, and gathering additional history of presenting problem. Therapist assessed for risk for self-harm and provided Pt with the [facility] at [phone], [place] and [employer] number [phone]) as well as Suicide and Crisis text line (988), and was encouraged to call 911, or go to the nearest Emergency Room with any thoughts of harming self or others or for any other concerns for safety. Therapist identified Pt's son as her strongest protective factor. Therapist inquired on Pt's daily routine and identified Pt's thoughts on this routine. Therapist provided psychoeducation on emotions/thoughts and prepared Pt for next steps in session on identifying and connecting to thoughts/emotions that Pt experiences.     Response: Pt was receptive to intervention and engaged with Therapist's questions. Pt identified with the emotion's sadness and anger. Pt denied SI and HI. Pt accepted the above crisis resources. Pt identified her son  as a protective factor. Pt reported her current routine prevents her from seeing her family, increases feeling tired, and is "sad."     Plan: Pt scheduled next session for Friday February 10 at 10 am via [place]. Future session will focus on treatment planning, identify goals, and practice skills to acknowledge her thoughts.     Treatment Plan: TBD

## 2021-05-12 NOTE — Progress Notes (Signed)
Progress Notes  Length of Session: 84 Minutes  This was session number : 3  For therapy type : Individual  Treatment Model used was: Acceptance Commitment Therapy  Mental/Functional Status  Mood was : Anxious  Affect was: Appropriate  Speech was: Clear/normal  Thought process was: Coherent  Auditory hallucinations reported: No  Visual hallucinations reported: No              ---------------------(data below generated by Titus Mould, PSY.D.)--------------------     Patient Verification & Telemedicine Consent & Financial Waiver:    1.   Identity: I have verified this patient's identity to be accurate.  2.   Consent: I verify consent has been secured in one of the following methods: (a) obtained written/ online attestation consent (via MyChartVideoVisit pathway), (b) the spoke-side provider has obtained verbal or written consent from patient/surrogate (if this is a "provider to provider" evaluation), or (c) in all other cases, I have personally obtained verbal consent from the patient/ surrogate (noting all elements below) to perform this voluntary telemedicine evaluation (including obtaining history, performing examination and reviewing data provided by the patient).   The patient/ surrogate has the right to refuse this evaluation.  I have explained risks (including potential loss of confidentiality), benefits, alternatives, and the potential need for subsequent face to face care. Patient/ surrogate understands that there is a risk of medical inaccuracies given that our recommendations will be made based on reported data (and we must therefore assume this information is accurate).  Knowing that there is a risk that this information is not reported accurately, and that the telemedicine video, audio, or data feed may be incomplete, the patient agrees to proceed with evaluation and holds [place] harmless knowing these risks.  3.   Healthcare Team: The patient/ surrogate has been notified that other healthcare  professionals (including students, residents and Metallurgist) may be involved in this audio-video evaluation.   All laws concerning confidentiality and patient access to medical records and copies of medical records apply to telemedicine.  4.   Privacy: If this is a Radiographer, therapeutic Visit, the patient/ surrogate has received the [place] Notice of Privacy Practices via E-Checkin process.  For all other video visit techniques, I have verbally provided the patient/ surrogate with the [place] in [place] (https://health.PodcastRanking.se.aspx) or Spanish (https://[email or web address]://www.matthews.info/.aspx).  The patient/ surrogate acknowledges both being provided the NPP link, and has been offered to have the NPP mailed to the patient/ surrogate by [place] mail.  The patient/ surrogate has voiced understanding an acknowledgement of receipt of this NPP web address.  If the patient/surrogate has elected to receive the NPP via [place] mail, I verify that the NPP will be sent promptly to the patient/surrogate via [place] mail.  5.   Capacity: I have reviewed this above verification and consent paragraph with the patient/ surrogate and the patient is capacitated or has a surrogate. If the patient is not capacitated to understand the above, and no surrogate is available, since this is not an emergency evaluation, the visit will be rescheduled until such time that the patient can consent, or the surrogate is available to consent. If this is an emergency evaluation and the patient is not capacitated to understand the above, and no surrogate is available, I am proceeding with this evaluation as this is felt to be an emergency setting and no appropriate specialist is available at the bedside to perform these evaluations.  6.   Financial Waiver: If this is a Radiographer, therapeutic  Visit, the patient has been made aware of the financial waiver via E-Checkin process.  For all other video visit techniques, an E-Checkin process is not  performed.  As such, I have personally verbally informed the patient/ surrogate that this evaluation will be a billable encounter similar to an in-person clinic visit, and the patient/ surrogate has agreed to pay the fee for services rendered.  If we are billing insurance for the patient's telehealth visit, her out-of-pocket cost will be determined based on her plan and will be billed to her.    7.   Intra-State Location: The patient/ surrogate attests to understanding that if the patient accesses these services from a location outside of [place], that the patient does so at the patient's own risk and initiative and that the patient is ultimately responsible for compliance with any laws or regulations associated with the patient's use.  8.   Specific Use:The patient/ surrogate understands that [facility] makes no representation that materials or servicesdelivered via telecommunication services, or listed on telemedicine websites, are appropriate or available for use in any other location.           Demographics:  Medical Record #: [id number]  Date: May 09, 2021   Patient Name: Ruth Hall  DOB: [birth year]  Age: 36 year old  Sex: female  Location: [address] [place], [place] [address]  Patient seen Status: Seen     Evaluator(s):  Robecca Fulgham was evaluated by me today.    Clinic Location: [facility]  [address]  [place] [address]    Presenting Problem: Pt was referred to [facility] by her PCP for depression with anxiety.     Mental Status: Pt arrived on-time for scheduled IBH therapy appointment via [place]. Pt appeared well groomed and dressed in a casual long sleeve top. Pt presented in an anxious mood with congruent affect. Pt displayed a coherent linear thought process with normal rate of speech and volume. Pt maintained normal level of eye contact. Pt was oriented x4. Pt denied SI/HI/AH/VH.     Goal(s) for treatment:   1. Establish a  sleep schedule of falling asleep at 10 pm and waking up at 5 am   2. Having working hours of 8 am to 4:30 pm   3. Re-engage with hiking   4. Say yes to all enjoyable events invited to   5. Iniciate inviting friends out to planned events   6. Be closer and present with family (no cellphone)     Description: Pt reported "I had a pretty good week." Pt processed her experience spending more time with her family and provided additional information regarding her tendency to stay late at work to decrease time alone with her thoughts.     Assessment/Action: Therapist's and trainee's intervention primarily focused on continued rapport building, empathic listening, and gathering history of presenting problem. Therapist reflected on Pt's experience following the past session. Therapist explored what a "less stressed week" looks like for Pt. Therapist explored Pt's relationship with emotions, specifically anger and sadness. Therapist collaborated with Pt on specific treatment goals she would like accomplish by the end of treament.     Response: Pt was receptive and engaged with intervention and identified treatment goals listed above.     Plan: Pt scheduled next session for Friday February 17 at 10 am via [place]. Therapist will provide psycho-education on emotions next session.     Treatment Plan:  Acceptance and Commitment Therapy; 1x/week; 55-minute sessions.

## 2021-05-14 ENCOUNTER — Encounter (INDEPENDENT_AMBULATORY_CARE_PROVIDER_SITE_OTHER): Payer: Self-pay | Admitting: Hospital

## 2021-05-14 ENCOUNTER — Telehealth (INDEPENDENT_AMBULATORY_CARE_PROVIDER_SITE_OTHER): Payer: HMO | Admitting: Family Medicine

## 2021-05-14 ENCOUNTER — Encounter (INDEPENDENT_AMBULATORY_CARE_PROVIDER_SITE_OTHER): Payer: Self-pay | Admitting: Family Medicine

## 2021-05-14 DIAGNOSIS — Z713 Dietary counseling and surveillance: Secondary | ICD-10-CM | POA: Insufficient documentation

## 2021-05-14 DIAGNOSIS — Z723 Lack of physical exercise: Secondary | ICD-10-CM

## 2021-05-14 DIAGNOSIS — F418 Other specified anxiety disorders: Secondary | ICD-10-CM

## 2021-05-14 MED ORDER — ESCITALOPRAM OXALATE 10 MG OR TABS
10.0000 mg | ORAL_TABLET | Freq: Every day | ORAL | 2 refills | Status: DC
Start: 2021-05-14 — End: 2021-11-19

## 2021-05-14 NOTE — Progress Notes (Signed)
---------------------(data below generated by Truddie Hidden, MD)--------------------     Patient Verification & Telemedicine Consent & Financial Waiver:    1.   Identity: I have verified this patient's identity to be accurate.  2.   Consent: I verify consent has been secured in one of the following methods: (a) obtained written/ online attestation consent (via MyChartVideoVisit pathway), (b) the spoke-side provider has obtained verbal or written consent from patient/surrogate (if this is a "provider to provider" evaluation), or (c) in all other cases, I have personally obtained verbal consent from the patient/ surrogate (noting all elements below) to perform this voluntary telemedicine evaluation (including obtaining history, performing examination and reviewing data provided by the patient).   The patient/ surrogate has the right to refuse this evaluation.  I have explained risks (including potential loss of confidentiality), benefits, alternatives, and the potential need for subsequent face to face care. Patient/ surrogate understands that there is a risk of medical inaccuracies given that our recommendations will be made based on reported data (and we must therefore assume this information is accurate).  Knowing that there is a risk that this information is not reported accurately, and that the telemedicine video, audio, or data feed may be incomplete, the patient agrees to proceed with evaluation and holds [place] harmless knowing these risks.  3.   Healthcare Team: The patient/ surrogate has been notified that other healthcare professionals (including students, residents and Metallurgist) may be involved in this audio-video evaluation.   All laws concerning confidentiality and patient access to medical records and copies of medical records apply to telemedicine.  4.   Privacy: If this is a Radiographer, therapeutic Visit, the patient/ surrogate has received the [place] Notice of Privacy Practices via E-Checkin  process.  For all other video visit techniques, I have verbally provided the patient/ surrogate with the [place] in [place] (https://health.PodcastRanking.se.aspx) or Spanish (https://[email or web address]://www.matthews.info/.aspx).  The patient/ surrogate acknowledges both being provided the NPP link, and has been offered to have the NPP mailed to the patient/ surrogate by [place] mail.  The patient/ surrogate has voiced understanding an acknowledgement of receipt of this NPP web address.  If the patient/surrogate has elected to receive the NPP via [place] mail, I verify that the NPP will be sent promptly to the patient/surrogate via [place] mail.  5.   Capacity: I have reviewed this above verification and consent paragraph with the patient/ surrogate and the patient is capacitated or has a surrogate. If the patient is not capacitated to understand the above, and no surrogate is available, since this is not an emergency evaluation, the visit will be rescheduled until such time that the patient can consent, or the surrogate is available to consent. If this is an emergency evaluation and the patient is not capacitated to understand the above, and no surrogate is available, I am proceeding with this evaluation as this is felt to be an emergency setting and no appropriate specialist is available at the bedside to perform these evaluations.  6.   Financial Waiver: If this is a Radiographer, therapeutic Visit, the patient has been made aware of the financial waiver via E-Checkin process.  For all other video visit techniques, an E-Checkin process is not performed.  As such, I have personally verbally informed the patient/ surrogate that this evaluation will be a billable encounter similar to an in-person clinic visit, and the patient/ surrogate has agreed to pay the fee for services rendered.  If we are billing insurance for the  patient's telehealth visit, her out-of-pocket cost will be determined based on her plan and will be billed to  her.  The patient/ surrogate has also been informed that if the patient does not have insurance or does not wish to use insurance, [place] [employer] price for a primary care telehealth visit is $59.00 and specialist telehealth visit is $88.00.  I have further informed the patient/ surrogate that in the event the patient has additional services provided in conjunction with the specialty visit (Ex. Psychotherapy services), those services will be billed at the current rate less a 45% discount.  7.   Intra-State Location: The patient/ surrogate attests to understanding that if the patient accesses these services from a location outside of [place], that the patient does so at the patient's own risk and initiative and that the patient is ultimately responsible for compliance with any laws or regulations associated with the patient's use.  8.   Specific Use:The patient/ surrogate understands that [facility] makes no representation that materials or servicesdelivered via telecommunication services, or listed on telemedicine websites, are appropriate or available for use in any other location.           Demographics:  Medical Record #: [id number]  Date: May 14, 2021  Patient Name: Ruth Hall  DOB: 05/13/1985  Age: 36 year old  Sex: female  Location: Home address on file  Patient seen Status: Patient was evaluated via telemedicine (audio or audio/video)     Evaluator(s):  Ruth Hall was evaluated by me today.    Clinic Location: [facility]  [address]  [place] [address]    Ruth Hall is a 36yo G1P1001 w/hx depression with anxiety, here for follow up.  Started escitalopram 04/08/21 - currently on 5 mg.  Feels she is doing better. Has also started cognitive therapy with Ruth Hall, has had two sessions    PMH:  #. hx HPV  #. former smoker  #. genital herpes  #. BMI 35 - followed by Obesity Medicine, on phentermine and topiramax  #. Hx  depressionwith anxiety  #. 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 Penicillin allergy    I reviewed the patient's medical record, including PMH, SH, medications, allergies, and ROS.    Physical Exam: General Appearance: healthy, alert, no distress, pleasant affect, cooperative.  Mental Status: Eye Contact: normal  Attention Span: good  Speech: normal volume, rate, and pitch  Mood (pt's report) :anxiety  Affect: full and appropriate  Suicidal Ideation: no  Homicidal Ideation: no    ASSESSMENT/PLAN:    Amiah was seen today for follow up.    Diagnoses and all orders for this visit:    Depression with anxiety  -     escitalopram (LEXAPRO) 10 MG tablet; Take 1 tablet (10 mg) by mouth daily.

## 2021-05-15 ENCOUNTER — Other Ambulatory Visit: Payer: Self-pay

## 2021-05-15 ENCOUNTER — Encounter (INDEPENDENT_AMBULATORY_CARE_PROVIDER_SITE_OTHER): Payer: Self-pay | Admitting: Family Medicine

## 2021-05-15 ENCOUNTER — Other Ambulatory Visit: Payer: HMO | Attending: Family Medicine | Admitting: Family Medicine

## 2021-05-15 VITALS — BP 119/83 | HR 89 | Temp 98.5°F | Ht 60.0 in | Wt 177.8 lb

## 2021-05-15 DIAGNOSIS — N898 Other specified noninflammatory disorders of vagina: Secondary | ICD-10-CM | POA: Insufficient documentation

## 2021-05-15 LAB — VAGINOSIS SCREEN
Candida, Vaginal Screen: NEGATIVE
Gardnerella, Vaginal Screen: NEGATIVE
Trichomonas, Vaginal Screen: NEGATIVE

## 2021-05-15 MED ORDER — CELEXA PO
ORAL | Status: DC
Start: ? — End: 2021-09-17

## 2021-05-15 NOTE — Patient Instructions (Signed)
I will notify you of test results and follow up plan.

## 2021-05-15 NOTE — Progress Notes (Signed)
Ruth Hall is a 36 year old female G1P1001 who presents for vaginal discharge.  No itching, no odor, no burning.    PMH:  #.hx HPV  #.former smoker  #.genital herpes  #.BMI 35 - followed by Obesity Medicine, on phentermine and topiramax  #. 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 Penicillin allergy      Metronidazole - Jan 13 x 7 days  Jan 20 - no symptoms  Clear discharge, no itching, no odor.    Patient's last menstrual period was 04/25/2021 (exact date).    Vitals:    05/15/21 1111   BP: 119/83   BP Location: Left arm   BP Patient Position: Sitting   BP cuff size: Regular   Pulse: 89   Temp: 98.5 F (36.9 C)   TempSrc: Temporal   SpO2: 100%   Weight: 80.6 kg (177 lb 12.8 oz)   Height: 5' (1.524 m)     Physical Exam:   General Appearance: healthy, alert, no distress, pleasant affect, cooperative.  Gyn:  Normal external female genitalia.  Speculum no discharge, no lesions.      ASSESSMENT/PLAN:    Ruth Hall was seen today for other.    Diagnoses and all orders for this visit:    Vaginal discharge  -     Vaginitis PCR Hologic Aptima multitest swab ([place] label)    Other orders  -     Vaginosis Screen

## 2021-05-15 NOTE — Telephone Encounter (Signed)
Population Health Team [employer] is a 37 year old female who I have contacted regarding: Follow up for  [email or web address]  Health Coaching, [place].    Spoke with patient regarding service. Patient reports not being active in the program as much due to having weekly therapy sessions. Staff encouraged patient to continue to use the service to reinforce what she is learning in therapy. Patient agreed to do so.

## 2021-05-16 ENCOUNTER — Encounter (INDEPENDENT_AMBULATORY_CARE_PROVIDER_SITE_OTHER): Payer: Self-pay | Admitting: Family Medicine

## 2021-05-16 ENCOUNTER — Telehealth (INDEPENDENT_AMBULATORY_CARE_PROVIDER_SITE_OTHER): Payer: 59 | Admitting: Clinical

## 2021-05-16 ENCOUNTER — Other Ambulatory Visit: Payer: Self-pay

## 2021-05-16 DIAGNOSIS — F411 Generalized anxiety disorder: Secondary | ICD-10-CM

## 2021-05-23 ENCOUNTER — Other Ambulatory Visit: Payer: Self-pay

## 2021-05-23 ENCOUNTER — Telehealth (INDEPENDENT_AMBULATORY_CARE_PROVIDER_SITE_OTHER): Payer: 59 | Admitting: Clinical

## 2021-05-23 DIAGNOSIS — F411 Generalized anxiety disorder: Secondary | ICD-10-CM

## 2021-05-26 NOTE — Progress Notes (Signed)
Progress Notes  Length of Session: 21 Minutes  This was session number : 5  For therapy type : Individual  Treatment Model used was: Acceptance Commitment Therapy  Mental/Functional Status  Mood was : Anxious  Affect was: Appropriate  Speech was: Clear/normal  Thought process was: Coherent  Auditory hallucinations reported: No  Visual hallucinations reported: No                 ---------------------(data below generated by Titus Mould, PSY.D.)--------------------     Patient Verification & Telemedicine Consent & Financial Waiver:    1.   Identity: I have verified this patient's identity to be accurate.  2.   Consent: I verify consent has been secured in one of the following methods: (a) obtained written/ online attestation consent (via MyChartVideoVisit pathway), (b) the spoke-side provider has obtained verbal or written consent from patient/surrogate (if this is a "provider to provider" evaluation), or (c) in all other cases, I have personally obtained verbal consent from the patient/ surrogate (noting all elements below) to perform this voluntary telemedicine evaluation (including obtaining history, performing examination and reviewing data provided by the patient).   The patient/ surrogate has the right to refuse this evaluation.  I have explained risks (including potential loss of confidentiality), benefits, alternatives, and the potential need for subsequent face to face care. Patient/ surrogate understands that there is a risk of medical inaccuracies given that our recommendations will be made based on reported data (and we must therefore assume this information is accurate).  Knowing that there is a risk that this information is not reported accurately, and that the telemedicine video, audio, or data feed may be incomplete, the patient agrees to proceed with evaluation and holds [place] harmless knowing these risks.  3.   Healthcare Team: The patient/ surrogate has been notified that other healthcare  professionals (including students, residents and Metallurgist) may be involved in this audio-video evaluation.   All laws concerning confidentiality and patient access to medical records and copies of medical records apply to telemedicine.  4.   Privacy: If this is a Radiographer, therapeutic Visit, the patient/ surrogate has received the [place] Notice of Privacy Practices via E-Checkin process.  For all other video visit techniques, I have verbally provided the patient/ surrogate with the [place] in [place] ([email or web address]://[email or web address]) or Spanish (https://[email or web address]://[email or web address].[email or web address]).  The patient/ surrogate acknowledges both being provided the NPP link, and has been offered to have the NPP mailed to the patient/ surrogate by [place] mail.  The patient/ surrogate has voiced understanding an acknowledgement of receipt of this NPP web address.  If the patient/surrogate has elected to receive the NPP via [place] mail, I verify that the NPP will be sent promptly to the patient/surrogate via [place] mail.  5.   Capacity: I have reviewed this above verification and consent paragraph with the patient/ surrogate and the patient is capacitated or has a surrogate. If the patient is not capacitated to understand the above, and no surrogate is available, since this is not an emergency evaluation, the visit will be rescheduled until such time that the patient can consent, or the surrogate is available to consent. If this is an emergency evaluation and the patient is not capacitated to understand the above, and no surrogate is available, I am proceeding with this evaluation as this is felt to be an emergency setting and no appropriate specialist is available at the bedside to perform these evaluations.  6.   Financial Waiver: If this is  a MyChart Video Visit, the patient has been made aware of the financial waiver via E-Checkin process.  For all other video visit techniques, an E-Checkin process is not  performed.  As such, I have personally verbally informed the patient/ surrogate that this evaluation will be a billable encounter similar to an in-person clinic visit, and the patient/ surrogate has agreed to pay the fee for services rendered.  If we are billing insurance for the patient's telehealth visit, her out-of-pocket cost will be determined based on her plan and will be billed to her.  7.   Intra-State Location: The patient/ surrogate attests to understanding that if the patient accesses these services from a location outside of [place], that the patient does so at the patient's own risk and initiative and that the patient is ultimately responsible for compliance with any laws or regulations associated with the patient's use.  8.   Specific Use:The patient/ surrogate understands that [facility] makes no representation that materials or servicesdelivered via telecommunication services, or listed on telemedicine websites, are appropriate or available for use in any other location.           Demographics:  Medical Record #: [id number]  Date: May 23, 2021   Patient Name: Ruth Hall  DOB: 10/30/1985  Age: 35 year old  Sex: female  Location: [address] [place], [place] [address]  Patient seen Status: Seen     Evaluator(s):  Keonda Dow was evaluated by me today.    Clinic Location: [facility]  [place] [address]     Therapy note co-written by trainee, Migdalia Dk, and supervising psychologist, Dr. Maxie Barb, Psy.D.     Presenting Problem: Pt was referred to [place] by her PCP for depression with anxiety.     Mental Status: Pt arrived on-time for scheduled IBH therapy appointment via [place]. Pt appeared well groomed and dressed in a sweater. Pt presented in an anxious mood with congruent affect. Pt displayed a coherent linear thought process with normal rate of speech and volume. Pt maintained normal level of eye  contact. Pt was oriented x4. Pt denied SI/HI/AH/VH.     Goal(s) for treatment:   1. Establish a sleep schedule of falling asleep at 10 pm and waking up at 5 am   2. Having working hours of 8 am to 4:30 pm   3. Re-engage with hiking   4. Say yes to all enjoyable events invited to   5. Initiate inviting friends out to planned events   6. Be closer and present with family (no cellphone)     Description: Pt reported having a "breakdown" on Wednesday and overall doing "good." Pt processed her experience going out with friends for dinner and noticed she initially felt nervous, however, following nervousness was feelings of happiness and laughter. Pt also went to the dog park. Pt reported having "one bad day" this past week where as prior to therapy, Pt was experiencing 3-4 "bad days" in a week.     Assessment/Action: Therapist's and trainee's intervention primarily focused on continued rapport building, empathic listening, validation, and normalization of Pt's experiences. Therapist reflected on Pt's experience of managing feelings of anxiety while being out with her friends. Therapist explored which emotions Pt avoids. Therapist reflected on what Pt remembered from the previous session. Therapist guided Pt through dropping anchor mindfulness exercise.Therapist collaborated with Pt on homework she would like to accomplish before  next session.     Response: Pt was receptive and engaged with intervention. Pt identified homework as completing a hike on Sunday with her dogs and a friend.     Plan: Therapist will continue mindfulness practice next session. Pt scheduled next session for Friday March 3 at 10 am via [place].     Treatment Plan: Acceptance and Commitment Therapy; 55-minute sessions; 12-sessions.

## 2021-05-26 NOTE — Progress Notes (Signed)
Progress Notes  Length of Session: 84 Minutes  This was session number : 4  For therapy type : Individual  Treatment Model used was: Acceptance Commitment Therapy  Mental/Functional Status  Mood was : Anxious  Affect was: Appropriate  Speech was: Clear/normal  Thought process was: Coherent  Auditory hallucinations reported: No  Visual hallucinations reported: No              ---------------------(data below generated by Titus Mould, PSY.D.)--------------------     Patient Verification & Telemedicine Consent & Financial Waiver:    1.   Identity: I have verified this patient's identity to be accurate.  2.   Consent: I verify consent has been secured in one of the following methods: (a) obtained written/ online attestation consent (via MyChartVideoVisit pathway), (b) the spoke-side provider has obtained verbal or written consent from patient/surrogate (if this is a "provider to provider" evaluation), or (c) in all other cases, I have personally obtained verbal consent from the patient/ surrogate (noting all elements below) to perform this voluntary telemedicine evaluation (including obtaining history, performing examination and reviewing data provided by the patient).   The patient/ surrogate has the right to refuse this evaluation.  I have explained risks (including potential loss of confidentiality), benefits, alternatives, and the potential need for subsequent face to face care. Patient/ surrogate understands that there is a risk of medical inaccuracies given that our recommendations will be made based on reported data (and we must therefore assume this information is accurate).  Knowing that there is a risk that this information is not reported accurately, and that the telemedicine video, audio, or data feed may be incomplete, the patient agrees to proceed with evaluation and holds [place] harmless knowing these risks.  3.   Healthcare Team: The patient/ surrogate has been notified that other healthcare  professionals (including students, residents and Metallurgist) may be involved in this audio-video evaluation.   All laws concerning confidentiality and patient access to medical records and copies of medical records apply to telemedicine.  4.   Privacy: If this is a Radiographer, therapeutic Visit, the patient/ surrogate has received the [place] Notice of Privacy Practices via E-Checkin process.  For all other video visit techniques, I have verbally provided the patient/ surrogate with the [place] in [place] (https://[email or web address]) or Spanish (https://[email or web address]://[email or web address]).  The patient/ surrogate acknowledges both being provided the NPP link, and has been offered to have the NPP mailed to the patient/ surrogate by [place] mail.  The patient/ surrogate has voiced understanding an acknowledgement of receipt of this NPP web address.  If the patient/surrogate has elected to receive the NPP via [place] mail, I verify that the NPP will be sent promptly to the patient/surrogate via [place] mail.  5.   Capacity: I have reviewed this above verification and consent paragraph with the patient/ surrogate and the patient is capacitated or has a surrogate. If the patient is not capacitated to understand the above, and no surrogate is available, since this is not an emergency evaluation, the visit will be rescheduled until such time that the patient can consent, or the surrogate is available to consent. If this is an emergency evaluation and the patient is not capacitated to understand the above, and no surrogate is available, I am proceeding with this evaluation as this is felt to be an emergency setting and no appropriate specialist is available at the bedside to perform these evaluations.  6.   Financial Waiver: If this is a Radiographer, therapeutic  Visit, the patient has been made aware of the financial waiver via E-Checkin process.  For all other video visit techniques, an E-Checkin process is not  performed.  As such, I have personally verbally informed the patient/ surrogate that this evaluation will be a billable encounter similar to an in-person clinic visit, and the patient/ surrogate has agreed to pay the fee for services rendered.  If we are billing insurance for the patient's telehealth visit, her out-of-pocket cost will be determined based on her plan and will be billed to her.    7.   Intra-State Location: The patient/ surrogate attests to understanding that if the patient accesses these services from a location outside of [place], that the patient does so at the patient's own risk and initiative and that the patient is ultimately responsible for compliance with any laws or regulations associated with the patient's use.  8.   Specific Use:The patient/ surrogate understands that [facility] makes no representation that materials or servicesdelivered via telecommunication services, or listed on telemedicine websites, are appropriate or available for use in any other location.           Demographics:  Medical Record #: [id number]  Date: May 16, 2021   Patient Name: Ruth Hall  DOB: November 10, 1985  Age: 36 year old  Sex: female  Location: [address] [place], [place] [address]  Patient seen Status: Seen     Evaluator(s):  Aries Kasa was evaluated by me today.    Clinic Location: [facility]  [place] [address]    Therapy note co-written by trainee, Migdalia Dk, and supervising psychologist, Dr. Maxie Barb, Psy.D.      Presenting Problem: Pt was referred to [facility] by her PCP for depression with anxiety.     Mental Status: Pt arrived on-time for scheduled IBH therapy appointment via [place]. Pt appeared well groomed and dressed in a casual long sleeve top. Pt presented in an anxious mood with congruent affect. Pt displayed a coherent linear thought process with normal rate of speech and volume. Pt maintained  normal level of eye contact. Pt was oriented x4. Pt denied SI/HI/AH/VH.     Goal(s) for treatment:   1. Establish a sleep schedule of falling asleep at 10 pm and waking up at 5 am   2. Having working hours of 8 am to 4:30 pm   3. Re-engage with hiking   4. Say yes to all enjoyable events invited to   5. Initiate inviting friends out to planned events   6. Be closer and present with family (no cellphone)     Description: Pt reported having increased stress at work on Friday and overall less stress." Pt processed her experience going out for a friend's birthday dinner which she stated "It actually does feel good." Pt reported leaving work at 5:30 pm vs. 7:30 pm and stated overall decreased stress and irritability.     Assessment/Action: Therapist's and trainee's intervention primarily focused on continued rapport building, empathic listening, validation, and normalization of Pt's experiences. Therapist reflected on Pt's experience of managing feelings of anxiety while being out with her friends. Therapist provided psychoeducation on emotions through and ACT perspective. Therapist utilized creative hopelessness. Therapist collaborated with Pt on homework she would like to accomplish before next session.       Response: Pt was receptive and engaged with intervention. Pt identified homework as completing a hike on Sunday  with her dogs and friend and continuing to leave work by 5:30 pm.     Plan: Pt scheduled next session for Friday February 24 at 10 am via [place].

## 2021-05-30 ENCOUNTER — Other Ambulatory Visit: Payer: Self-pay

## 2021-05-30 ENCOUNTER — Telehealth (INDEPENDENT_AMBULATORY_CARE_PROVIDER_SITE_OTHER): Payer: 59 | Admitting: Clinical

## 2021-05-30 DIAGNOSIS — F411 Generalized anxiety disorder: Secondary | ICD-10-CM

## 2021-06-02 NOTE — Progress Notes (Signed)
Progress Notes  Length of Session: 39 Minutes  This was session number : 6  For therapy type : Individual  Treatment Model used was: Acceptance Commitment Therapy  Mental/Functional Status  Mood was : Anxious  Affect was: Appropriate  Speech was: Clear/normal  Thought process was: Coherent  Auditory hallucinations reported: No  Visual hallucinations reported: No              ---------------------(data below generated by Titus Mould, PSY.D.)--------------------     Patient Verification & Telemedicine Consent & Financial Waiver:    1.   Identity: I have verified this patient's identity to be accurate.  2.   Consent: I verify consent has been secured in one of the following methods: (a) obtained written/ online attestation consent (via MyChartVideoVisit pathway), (b) the spoke-side provider has obtained verbal or written consent from patient/surrogate (if this is a "provider to provider" evaluation), or (c) in all other cases, I have personally obtained verbal consent from the patient/ surrogate (noting all elements below) to perform this voluntary telemedicine evaluation (including obtaining history, performing examination and reviewing data provided by the patient).   The patient/ surrogate has the right to refuse this evaluation.  I have explained risks (including potential loss of confidentiality), benefits, alternatives, and the potential need for subsequent face to face care. Patient/ surrogate understands that there is a risk of medical inaccuracies given that our recommendations will be made based on reported data (and we must therefore assume this information is accurate).  Knowing that there is a risk that this information is not reported accurately, and that the telemedicine video, audio, or data feed may be incomplete, the patient agrees to proceed with evaluation and holds [place] harmless knowing these risks.  3.   Healthcare Team: The patient/ surrogate has been notified that other healthcare  professionals (including students, residents and Metallurgist) may be involved in this audio-video evaluation.   All laws concerning confidentiality and patient access to medical records and copies of medical records apply to telemedicine.  4.   Privacy: If this is a Radiographer, therapeutic Visit, the patient/ surrogate has received the [place] Notice of Privacy Practices via E-Checkin process.  For all other video visit techniques, I have verbally provided the patient/ surrogate with the [place] in [place] (https://[email or web address]) or Spanish (https://[email or web address]://[email or web address].[email or web address]).  The patient/ surrogate acknowledges both being provided the NPP link, and has been offered to have the NPP mailed to the patient/ surrogate by [place] mail.  The patient/ surrogate has voiced understanding an acknowledgement of receipt of this NPP web address.  If the patient/surrogate has elected to receive the NPP via [place] mail, I verify that the NPP will be sent promptly to the patient/surrogate via [place] mail.  5.   Capacity: I have reviewed this above verification and consent paragraph with the patient/ surrogate and the patient is capacitated or has a surrogate. If the patient is not capacitated to understand the above, and no surrogate is available, since this is not an emergency evaluation, the visit will be rescheduled until such time that the patient can consent, or the surrogate is available to consent. If this is an emergency evaluation and the patient is not capacitated to understand the above, and no surrogate is available, I am proceeding with this evaluation as this is felt to be an emergency setting and no appropriate specialist is available at the bedside to perform these evaluations.  6.   Financial Waiver: If this is a Radiographer, therapeutic  Visit, the patient has been made aware of the financial waiver via E-Checkin process.  For all other video visit techniques, an E-Checkin process is not  performed.  As such, I have personally verbally informed the patient/ surrogate that this evaluation will be a billable encounter similar to an in-person clinic visit, and the patient/ surrogate has agreed to pay the fee for services rendered.  If we are billing insurance for the patient's telehealth visit, her out-of-pocket cost will be determined based on her plan and will be billed to her.    7.   Intra-State Location: The patient/ surrogate attests to understanding that if the patient accesses these services from a location outside of [place], that the patient does so at the patient's own risk and initiative and that the patient is ultimately responsible for compliance with any laws or regulations associated with the patient's use.  8.   Specific Use:The patient/ surrogate understands that [facility] makes no representation that materials or servicesdelivered via telecommunication services, or listed on telemedicine websites, are appropriate or available for use in any other location.           Demographics:  Medical Record #: [id number]  Date: May 30, 2021   Patient Name: Ruth Hall  DOB: 1986/02/04  Age: 36 year old  Sex: female  Location: [address] [place], [place] [address]  Patient seen Status: Seen     Evaluator(s):  Sonnet Rizor was evaluated by me today.    Clinic Location: [facility]  [place] [address]    Therapy note co-written by trainee, Migdalia Dk, and supervising psychologist, Dr. Maxie Barb, Psy.D.       Presenting Problem: Pt was referred to [facility] by her PCP for depression with anxiety.     Mental Status: Pt arrived on-time for scheduled IBH therapy appointment via [place]. Pt appeared well groomed and dressed in a casual long sleeve sweater. Pt presented in an anxious mood with congruent affect. Pt displayed a coherent linear thought process with normal rate of speech and volume. Pt maintained  normal level of eye contact. Pt was oriented x4. Pt denied SI/HI/AH/VH.     Goal(s) for treatment:   1. Establish a sleep schedule of falling asleep at 10 pm and waking up at 5 am   2. Having working hours of 8 am to 4:30 pm   3. Re-engage with hiking   4. Say yes to all enjoyable events invited to   5. Initiate inviting friends out to planned events   6. Be closer and present with family (no cellphone)     Description: Pt reported work being busy to do implications from the weather and has not been able to leave work between 4:30-5:30 pm. Pt processed her experience going to [place] with her friend and dogs on Saturday which she stated, "I was happy and enjoyed my time." Pt also reported overall improved sleep.     Assessment/Action: Therapist reflected on what Pt remembered from the previous session. Therapist guided Pt through dropping anchor mindfulness exercise. Therapist reviewed Pt's progress and explored what holds Pt back from being present with her family. Therapist collaborated with Pt on homework she would like to accomplish before next session.     Response: Pt was receptive and engaged with intervention. Pt identified homework as leaving work at 4:30 pm and being with her family without being on the phone.  Plan: Pt scheduled next session for Friday March 10 at 10 am via [place].     Treatment Plan: Acceptance and Commitment Therapy; 1x/week; 55-minute sessions; 12-sessions.

## 2021-06-06 ENCOUNTER — Telehealth (INDEPENDENT_AMBULATORY_CARE_PROVIDER_SITE_OTHER): Payer: 59 | Admitting: Clinical

## 2021-06-06 ENCOUNTER — Other Ambulatory Visit: Payer: Self-pay

## 2021-06-06 DIAGNOSIS — F411 Generalized anxiety disorder: Secondary | ICD-10-CM

## 2021-06-10 NOTE — Progress Notes (Signed)
Progress Notes  Length of Session: 12 Minutes  This was session number : 7  For therapy type : Individual  Treatment Model used was: Acceptance Commitment Therapy  Mental/Functional Status  Mood was : Anxious  Affect was: Appropriate  Speech was: Clear/normal  Thought process was: Coherent  Auditory hallucinations reported: No  Visual hallucinations reported: No                    ---------------------(data below generated by Titus Mould, PSY.D.)--------------------     Patient Verification & Telemedicine Consent & Financial Waiver:    1.   Identity: I have verified this patient's identity to be accurate.  2.   Consent: I verify consent has been secured in one of the following methods: (a) obtained written/ online attestation consent (via MyChartVideoVisit pathway), (b) the spoke-side provider has obtained verbal or written consent from patient/surrogate (if this is a "provider to provider" evaluation), or (c) in all other cases, I have personally obtained verbal consent from the patient/ surrogate (noting all elements below) to perform this voluntary telemedicine evaluation (including obtaining history, performing examination and reviewing data provided by the patient).   The patient/ surrogate has the right to refuse this evaluation.  I have explained risks (including potential loss of confidentiality), benefits, alternatives, and the potential need for subsequent face to face care. Patient/ surrogate understands that there is a risk of medical inaccuracies given that our recommendations will be made based on reported data (and we must therefore assume this information is accurate).  Knowing that there is a risk that this information is not reported accurately, and that the telemedicine video, audio, or data feed may be incomplete, the patient agrees to proceed with evaluation and holds [place] harmless knowing these risks.  3.   Healthcare Team: The patient/ surrogate has been notified that other healthcare  professionals (including students, residents and Metallurgist) may be involved in this audio-video evaluation.   All laws concerning confidentiality and patient access to medical records and copies of medical records apply to telemedicine.  4.   Privacy: If this is a Radiographer, therapeutic Visit, the patient/ surrogate has received the [place] Notice of Privacy Practices via E-Checkin process.  For all other video visit techniques, I have verbally provided the patient/ surrogate with the [place] in [place] (https://[email or web address]) or Spanish (https://[email or web address]://www.matthews.[email or web address]).  The patient/ surrogate acknowledges both being provided the NPP link, and has been offered to have the NPP mailed to the patient/ surrogate by [place] mail.  The patient/ surrogate has voiced understanding an acknowledgement of receipt of this NPP web address.  If the patient/surrogate has elected to receive the NPP via [place] mail, I verify that the NPP will be sent promptly to the patient/surrogate via [place] mail.  5.   Capacity: I have reviewed this above verification and consent paragraph with the patient/ surrogate and the patient is capacitated or has a surrogate. If the patient is not capacitated to understand the above, and no surrogate is available, since this is not an emergency evaluation, the visit will be rescheduled until such time that the patient can consent, or the surrogate is available to consent. If this is an emergency evaluation and the patient is not capacitated to understand the above, and no surrogate is available, I am proceeding with this evaluation as this is felt to be an emergency setting and no appropriate specialist is available at the bedside to perform these evaluations.  6.   Financial Waiver:  If this is a Radiographer, therapeutic Visit, the patient has been made aware of the financial waiver via E-Checkin process.  For all other video visit techniques, an E-Checkin process is not  performed.  As such, I have personally verbally informed the patient/ surrogate that this evaluation will be a billable encounter similar to an in-person clinic visit, and the patient/ surrogate has agreed to pay the fee for services rendered.  If we are billing insurance for the patient's telehealth visit, her out-of-pocket cost will be determined based on her plan and will be billed to her.   7.   Intra-State Location: The patient/ surrogate attests to understanding that if the patient accesses these services from a location outside of [place], that the patient does so at the patient's own risk and initiative and that the patient is ultimately responsible for compliance with any laws or regulations associated with the patient's use.  8.   Specific Use:The patient/ surrogate understands that [facility] makes no representation that materials or servicesdelivered via telecommunication services, or listed on telemedicine websites, are appropriate or available for use in any other location.           Demographics:  Medical Record #: [id number]  Date: June 06, 2021   Patient Name: Ruth Hall  DOB: 1986/03/10  Age: 36 year old  Sex: female  Location: [address] [place], [place] [address]  Patient seen Status: Seen     Evaluator(s):  Zawadi Aplin was evaluated by me today.    Clinic Location: [facility]  [place] [address]     Therapy note co-written by trainee, Ruth Hall, and supervising psychologist, Dr. Maxie Barb, Psy.D.       Presenting Problem: Pt was referred to [facility] by her PCP for depression with anxiety.     Mental Status: Pt arrived on-time for scheduled IBH therapy appointment via [place]. Pt appeared well groomed and dressed in a casual long sleeve sweater. Pt presented in an anxious mood with congruent affect. Pt displayed a coherent linear thought process with normal rate of speech and volume. Pt  maintained normal level of eye contact. Pt was oriented x4. Pt denied SI/HI/AH/VH.     Goal(s) for treatment:   1. Establish a sleep schedule of falling asleep at 10 pm and waking up at 5 am   2. Having working hours of 8 am to 4:30 pm   3. Re-engage with hiking   4. Say yes to all enjoyable events invited to   5. Initiate inviting friends out to planned events   6. Be closer and present with family (no cellphone)     Description: Pt reported leaving work between 5/530 pm and during this week Pt had to be at work 6 am and left at 2:30 pm. Pt described enjoying having an hour to herself at home before everyone else arrived. Pt processed her experience going to a funeral and dinner with her friends. Pt reported noticing an overall decrease in arguments at home this past week.     Assessment/Action: Therapist reflected on what Pt remembered from the previous session. Therapist explored family interpersonal functioning and processed Pt's grief experience. Therapist guided Pt through grounding mindfulness 5 senses exercise. Therapist collaborated with Pt on homework she would like to accomplish before next session.     Response: Pt was receptive and engaged with intervention. Pt identified homework as leaving work at  4:30 pm, being with her family without being on the phone, and writing a grief letter.     Plan: Pt scheduled next session for Friday March 17 at 10 am via [place].      Treatment Plan: Acceptance and Commitment Therapy; 1x/week; 55-minute sessions; 12-sessions.

## 2021-06-13 ENCOUNTER — Telehealth (INDEPENDENT_AMBULATORY_CARE_PROVIDER_SITE_OTHER): Payer: 59 | Admitting: Clinical

## 2021-06-13 ENCOUNTER — Other Ambulatory Visit: Payer: Self-pay

## 2021-06-13 DIAGNOSIS — F411 Generalized anxiety disorder: Secondary | ICD-10-CM

## 2021-06-16 NOTE — Progress Notes (Signed)
Progress Notes  Length of Session: 105 Minutes  This was session number : 8  For therapy type : Individual  Treatment Model used was: Acceptance Commitment Therapy  Mental/Functional Status  Mood was : Anxious  Affect was: Appropriate  Speech was: Clear/normal  Thought process was: Coherent  Auditory hallucinations reported: No  Visual hallucinations reported: No                 ---------------------(data below generated by Ruth Hall, PSY.D.)--------------------     Patient Verification & Telemedicine Consent & Financial Waiver:    1.   Identity: I have verified this patient's identity to be accurate.  2.   Consent: I verify consent has been secured in one of the following methods: (a) obtained written/ online attestation consent (via MyChartVideoVisit pathway), (b) the spoke-side provider has obtained verbal or written consent from patient/surrogate (if this is a "provider to provider" evaluation), or (c) in all other cases, I have personally obtained verbal consent from the patient/ surrogate (noting all elements below) to perform this voluntary telemedicine evaluation (including obtaining history, performing examination and reviewing data provided by the patient).   The patient/ surrogate has the right to refuse this evaluation.  I have explained risks (including potential loss of confidentiality), benefits, alternatives, and the potential need for subsequent face to face care. Patient/ surrogate understands that there is a risk of medical inaccuracies given that our recommendations will be made based on reported data (and we must therefore assume this information is accurate).  Knowing that there is a risk that this information is not reported accurately, and that the telemedicine video, audio, or data feed may be incomplete, the patient agrees to proceed with evaluation and holds [place] harmless knowing these risks.  3.   Healthcare Team: The patient/ surrogate has been notified that other healthcare  professionals (including students, residents and Ruth Hall) may be involved in this audio-video evaluation.   All laws concerning confidentiality and patient access to medical records and copies of medical records apply to telemedicine.  4.   Privacy: If this is a Radiographer, therapeutic Visit, the patient/ surrogate has received the [place] Notice of Privacy Practices via E-Checkin process.  For all other video visit techniques, I have verbally provided the patient/ surrogate with the [place] in [place] (https://[email or web address].[email or web address]) or Spanish (https://[email or web address]://[email or web address].[email or web address]).  The patient/ surrogate acknowledges both being provided the NPP link, and has been offered to have the NPP mailed to the patient/ surrogate by [place] mail.  The patient/ surrogate has voiced understanding an acknowledgement of receipt of this NPP web address.  If the patient/surrogate has elected to receive the NPP via [place] mail, I verify that the NPP will be sent promptly to the patient/surrogate via [place] mail.  5.   Capacity: I have reviewed this above verification and consent paragraph with the patient/ surrogate and the patient is capacitated or has a surrogate. If the patient is not capacitated to understand the above, and no surrogate is available, since this is not an emergency evaluation, the visit will be rescheduled until such time that the patient can consent, or the surrogate is available to consent. If this is an emergency evaluation and the patient is not capacitated to understand the above, and no surrogate is available, I am proceeding with this evaluation as this is felt to be an emergency setting and no appropriate specialist is available at the bedside to perform these evaluations.  6.   Financial Waiver: If this is  a MyChart Video Visit, the patient has been made aware of the financial waiver via E-Checkin process.  For all other video visit techniques, an E-Checkin process is not  performed.  As such, I have personally verbally informed the patient/ surrogate that this evaluation will be a billable encounter similar to an in-person clinic visit, and the patient/ surrogate has agreed to pay the fee for services rendered.  If we are billing insurance for the patient's telehealth visit, her out-of-pocket cost will be determined based on her plan and will be billed to her.  The patient/ surrogate has also been informed that if the patient does not have insurance or does not wish to use insurance, [place] Google price for a primary care telehealth visit is $59.00 and specialist telehealth visit is $88.00.  I have further informed the patient/ surrogate that in the event the patient has additional services provided in conjunction with the specialty visit (Ex. Psychotherapy services), those services will be billed at the current rate less a 45% discount.  7.   Intra-State Location: The patient/ surrogate attests to understanding that if the patient accesses these services from a location outside of [place], that the patient does so at the patient's own risk and initiative and that the patient is ultimately responsible for compliance with any laws or regulations associated with the patient's use.  8.   Specific Use:The patient/ surrogate understands that [facility] makes no representation that materials or servicesdelivered via telecommunication services, or listed on telemedicine websites, are appropriate or available for use in any other location.           Demographics:  Medical Record #: [id number]  Date: June 13, 2021   Patient Name: Ruth Hall  DOB: 01-01-1986  Age: 36 year old  Sex: female  Location: [address] [place], [place] [address]  Patient seen Status: Seen     Evaluator(s):  Ruth Hall was evaluated by me today.    Clinic Location: [facility]  [place] [address]     Therapy note co-written by  trainee, Ruth Hall, and supervising psychologist, Ruth Hall, Psy.D.       Presenting Problem: Pt was referred to [facility] by her PCP for depression with anxiety.     Mental Status: Pt arrived on-time for scheduled IBH therapy appointment via [place]. Pt appeared well groomed and dressed in a casual long sleeve sweater. Pt presented in an euthymic/anxious mood with congruent affect. Pt displayed a coherent linear thought process with normal rate of speech and volume. Pt maintained normal level of eye contact. Pt was oriented x4. Pt denied SI/HI/AH/VH.     Goal(s) for treatment:   1. Establish a sleep schedule of falling asleep at 10 pm and waking up at 5 am   2. Having working hours of 8 am to 4:30 pm   3. Re-engage with hiking   4. Say yes to all enjoyable events invited to   5. Initiate inviting friends out to planned events   6. Be closer and present with family (no cellphone)     Description: Pt reported leaving work at 5 pm during this week. Pt processed experiencing a second week with less disagreements at home. Pt described feeling more "relaxed" when waking up in the mornings.     Assessment/Action: Therapist reflected on what Pt remembered from the previous session. Therapist guided Pt through dropping  anchor mindfulness exercise. Therapist reviewed Pt's progress and reflected on how the progress has impacted her life and daily functioning. Therapist provided psychoeducation on anger and sadness and misplaced projection of the emotions. Therapist collaborated with Pt on homework she would like to accomplish before next session.     Response: Pt was receptive and engaged with intervention. Pt identified homework as leaving work between 4:30/5 pm.     Plan: Pt scheduled next session for Friday March 24 at 10 am via [place].      Treatment Plan: Acceptance and Commitment Therapy; 1x/week; 55-minute sessions; 12-sessions.

## 2021-06-20 ENCOUNTER — Encounter (INDEPENDENT_AMBULATORY_CARE_PROVIDER_SITE_OTHER): Payer: 59 | Admitting: Clinical

## 2021-06-23 ENCOUNTER — Telehealth (INDEPENDENT_AMBULATORY_CARE_PROVIDER_SITE_OTHER): Payer: Self-pay

## 2021-06-23 NOTE — Telephone Encounter (Signed)
[facility] called to follow up with Pt following appointment cancellation. Pt did not answer and [facility] left a message instructing Pt to call the patient navigator at [phone], Dr. Carron Brazen at [phone], or respond to MyChart message to reschedule [facility] appointment.

## 2021-06-30 NOTE — Telephone Encounter (Signed)
Wanted to keep you in the loop and share her response.

## 2021-07-18 ENCOUNTER — Encounter (INDEPENDENT_AMBULATORY_CARE_PROVIDER_SITE_OTHER): Payer: Self-pay | Admitting: Endocrinology

## 2021-07-18 ENCOUNTER — Other Ambulatory Visit (INDEPENDENT_AMBULATORY_CARE_PROVIDER_SITE_OTHER): Payer: Self-pay | Admitting: Dermatology

## 2021-07-18 DIAGNOSIS — L309 Dermatitis, unspecified: Secondary | ICD-10-CM

## 2021-07-21 MED ORDER — CLOBETASOL PROPIONATE 0.05 % EX SOLN
CUTANEOUS | 1 refills | Status: DC
Start: 2021-07-21 — End: 2021-09-17

## 2021-08-04 ENCOUNTER — Telehealth (INDEPENDENT_AMBULATORY_CARE_PROVIDER_SITE_OTHER): Payer: HMO | Admitting: Endocrinology

## 2021-08-04 ENCOUNTER — Encounter (INDEPENDENT_AMBULATORY_CARE_PROVIDER_SITE_OTHER): Payer: Self-pay | Admitting: Endocrinology

## 2021-08-04 DIAGNOSIS — Z6835 Body mass index (BMI) 35.0-35.9, adult: Secondary | ICD-10-CM

## 2021-08-04 DIAGNOSIS — E6609 Other obesity due to excess calories: Secondary | ICD-10-CM

## 2021-08-04 MED ORDER — WEGOVY 1 MG/0.5ML SC SOAJ
1.0000 mg | SUBCUTANEOUS | 0 refills | Status: DC
Start: 2021-09-29 — End: 2021-09-17

## 2021-08-04 MED ORDER — WEGOVY 2.4 MG/0.75ML SC SOAJ
2.4000 mg | SUBCUTANEOUS | 2 refills | Status: DC
Start: 2021-11-24 — End: 2021-09-17

## 2021-08-04 MED ORDER — WEGOVY 0.5 MG/0.5ML SC SOAJ
0.5000 mg | SUBCUTANEOUS | 0 refills | Status: DC
Start: 2021-09-01 — End: 2021-09-17

## 2021-08-04 MED ORDER — WEGOVY 0.25 MG/0.5ML SC SOAJ
0.2500 mg | SUBCUTANEOUS | 0 refills | Status: AC
Start: 2021-08-04 — End: 2021-09-01

## 2021-08-04 MED ORDER — WEGOVY 1.7 MG/0.75ML SC SOAJ
1.7000 mg | SUBCUTANEOUS | 0 refills | Status: DC
Start: 2021-10-27 — End: 2021-09-17

## 2021-08-04 NOTE — Progress Notes (Signed)
Subjective:   Ruth Hall is a 36 year old female who is here for Follow Up (Class 1 obesity)    ---------------------(data below generated by Sherian Rein, MD)--------------------     Patient Verification & Telemedicine Consent & Financial Waiver:    1.   Identity: I have verified this patient's identity to be accurate.  2.   Consent: I verify consent has been secured in one of the following methods: (a) obtained written/ online attestation consent (via MyChartVideoVisit pathway), (b) the spoke-side provider has obtained verbal or written consent from patient/surrogate (if this is a "provider to provider" evaluation), or (c) in all other cases, I have personally obtained verbal consent from the patient/ surrogate (noting all elements below) to perform this voluntary telemedicine evaluation (including obtaining history, performing examination and reviewing data provided by the patient).   The patient/ surrogate has the right to refuse this evaluation.  I have explained risks (including potential loss of confidentiality), benefits, alternatives, and the potential need for subsequent face to face care. Patient/ surrogate understands that there is a risk of medical inaccuracies given that our recommendations will be made based on reported data (and we must therefore assume this information is accurate).  Knowing that there is a risk that this information is not reported accurately, and that the telemedicine video, audio, or data feed may be incomplete, the patient agrees to proceed with evaluation and holds [place] harmless knowing these risks.  3.   Healthcare Team: The patient/ surrogate has been notified that other healthcare professionals (including students, residents and Metallurgist) may be involved in this audio-video evaluation.   All laws concerning confidentiality and patient access to medical records and copies of medical records apply to telemedicine.  4.   Privacy: If this is a  Radiographer, therapeutic Visit, the patient/ surrogate has received the [place] Notice of Privacy Practices via E-Checkin process.  For all other video visit techniques, I have verbally provided the patient/ surrogate with the [place] in [place] ([email or web address]) or Spanish ([email or web address]://[email or web address]://[email or web address].[email or web address]).  The patient/ surrogate acknowledges both being provided the NPP link, and has been offered to have the NPP mailed to the patient/ surrogate by [place] mail.  The patient/ surrogate has voiced understanding an acknowledgement of receipt of this NPP web address.  If the patient/surrogate has elected to receive the NPP via [place] mail, I verify that the NPP will be sent promptly to the patient/surrogate via [place] mail.  5.   Capacity: I have reviewed this above verification and consent paragraph with the patient/ surrogate and the patient is capacitated or has a surrogate. If the patient is not capacitated to understand the above, and no surrogate is available, since this is not an emergency evaluation, the visit will be rescheduled until such time that the patient can consent, or the surrogate is available to consent. If this is an emergency evaluation and the patient is not capacitated to understand the above, and no surrogate is available, I am proceeding with this evaluation as this is felt to be an emergency setting and no appropriate specialist is available at the bedside to perform these evaluations.  6.   Financial Waiver: If this is a Radiographer, therapeutic Visit, the patient has been made aware of the financial waiver via E-Checkin process.  For all other video visit techniques, an E-Checkin process is not performed.  As such, I have personally verbally informed the patient/ surrogate that this evaluation will be a billable encounter similar to an  in-person clinic visit, and the patient/ surrogate has agreed to pay the fee for services rendered.  If we are billing insurance for the  patient's telehealth visit, her out-of-pocket cost will be determined based on her plan and will be billed to her.  The patient/ surrogate has also been informed that if the patient does not have insurance or does not wish to use insurance, [place] [employer] price for a primary care telehealth visit is $59.00 and specialist telehealth visit is $88.00.  I have further informed the patient/ surrogate that in the event the patient has additional services provided in conjunction with the specialty visit (Ex. Psychotherapy services), those services will be billed at the current rate less a 45% discount.  7.   Intra-State Location: The patient/ surrogate attests to understanding that if the patient accesses these services from a location outside of [place], that the patient does so at the patient's own risk and initiative and that the patient is ultimately responsible for compliance with any laws or regulations associated with the patient's use.  8.   Specific Use:The patient/ surrogate understands that [place] makes no representation that materials or servicesdelivered via telecommunication services, or listed on telemedicine websites, are appropriate or available for use in any other location.           Demographics:  Medical Record #: [id number]  Date: Aug 04, 2021  Patient Name: Ruth Hall  DOB: January 14, 1986  Age: 36 year old  Sex: female  Location: Work address: on file  Patient seen Status: Patient was evaluated via telemedicine (audio or audio/video)     Evaluator(s):  Diani Jillson was evaluated by me today.    Clinic Location: [facility]  [address], [address]  [place] [address]                       HPI  Has always been struggling with her weight since age 13.   Lowest adult 150 lbs  Highest adult weight250 lbs with pregnancy (2005)  Lost weight with phentermine 10 years ago -->165-170 lbs  1st visit 7/22 193 lbs-->  started phen/top.   Last visit 176 lbs 11/22  Current weight182 lbs    Stopped taking phentermine/topamax last week as she has gained wt back. Just restarted topamax   Went to wellness clinic and got 1 dose of ozempic.   Denies side effects from injection but did not notice change of appetite.  Appetite has increased past month and started craving soda.   Recall  B: oatmeal   L: salad  D: cereal or salad.       Works at a retirement home.   Has more energy.   Has been more active. Started going to the gym last week.     Past Medical History:   Diagnosis Date   . Blood transfusion declined because patient is Jehovah's Witness    . HPV in female    . Migraine    . Obesity    '      Review of Systems   Constitutional: Negative for chills and fever.   Respiratory: Negative for cough and shortness of breath.    Cardiovascular: Negative for chest pain and palpitations.   Gastrointestinal: Negative for constipation and diarrhea.   Neurological: Negative for headaches.    remainder ROS neg    Current Outpatient  Medications   Medication Sig Dispense Refill   . calcipotriene (DOVONEX) 0.005 % ointment Apply topically to affected area twice a day on the weekends to affected areas on the elbows and knees 60 g 5   . Citalopram Hydrobromide (CELEXA PO)      . clobetasol propionate (TMOVATE) 0.05 % solution Apply a small amount to the scalp twice daily for 2 weeks. 25 mL 1   . escitalopram (LEXAPRO) 10 MG tablet Take 1 tablet (10 mg) by mouth daily. 30 tablet 2   . ketoconazole (NIZORAL) 2 % shampoo APPLY TOPICALLY 1 APPLICATION ONCE FOR 1 DOSE 120 mL 1   . phentermine 30 MG capsule Take 1 capsule (30 mg) by mouth every morning. 90 capsule 0   . Semaglutide-Weight Management (WEGOVY) 0.25 MG/0.5ML SOAJ Inject 0.25 mg under the skin once a week for 28 days. 2 mL 0   . [START ON 09/01/2021] Semaglutide-Weight Management (WEGOVY) 0.5 MG/0.5ML SOAJ Inject 0.5 mg under the skin once a week for 28 days. 2 mL 0   . [START ON 09/29/2021]  Semaglutide-Weight Management (WEGOVY) 1 MG/0.5ML SOAJ Inject 1 mg under the skin once a week for 28 days. 2 mL 0   . [START ON 10/27/2021] Semaglutide-Weight Management (WEGOVY) 1.7 MG/0.75ML SOAJ Inject 1.7 mg under the skin once a week for 28 days. 3 mL 0   . [START ON 11/24/2021] Semaglutide-Weight Management (WEGOVY) 2.4 MG/0.75ML SOAJ Inject 2.4 mg under the skin once a week. 3 mL 2   . topiramate (TOPAMAX) 25 MG tablet Take 1 tablet (25 mg) by mouth daily. Every evening 90 tablet 2   . triamcinolone (KENALOG) 0.1 % cream Apply 1 Application topically 2 times daily. Apply a thin layer as directed 45 g 0     No current facility-administered medications for this visit.     Allergies   Allergen Reactions   . Penicillins Hives     Other reaction(s): Hives       Reviewed patients pertinent information related to social history, past medical, past surgical, and family history.     Objective:  Vital signs: Wt 82.6 kg (182 lb)   BMI 35.54 kg/m     Physical Exam  Constitutional:       General: She is not in acute distress.  Pulmonary:      Effort: Pulmonary effort is normal. No respiratory distress.   Neurological:      Mental Status: She is alert and oriented to person, place, and time.   Psychiatric:         Mood and Affect: Mood normal.         Behavior: Behavior normal.         Assessment/Plan:  Ruth Hall was seen today for follow up.    Diagnoses and all orders for this visit:    Class 2 obesity due to excess calories without serious comorbidity with body mass index (BMI) of 35.0 to 35.9 in adult  -     Weight Management Services - Follow Up in This Department  -     Semaglutide-Weight Management (WEGOVY) 0.25 MG/0.5ML SOAJ; Inject 0.25 mg under the skin once a week for 28 days.  -     Semaglutide-Weight Management (WEGOVY) 0.5 MG/0.5ML SOAJ; Inject 0.5 mg under the skin once a week for 28 days.  -     Semaglutide-Weight Management (WEGOVY) 1 MG/0.5ML SOAJ; Inject 1 mg under the skin once a week for 28 days.  -  Semaglutide-Weight Management (WEGOVY) 1.7 MG/0.75ML SOAJ; Inject 1.7 mg under the skin once a week for 28 days.  -     Semaglutide-Weight Management (WEGOVY) 2.4 MG/0.75ML SOAJ; Inject 2.4 mg under the skin once a week.    36 yo lady obesity class 2--> class 1--> class 2 BMI 35 with knee pain, migraine. Gained wt back despite taking phen/top. Wants to try wegovy.    Plan  - diet counseling  - regular exercise  - continue topamax  - dc phentermine  - start wegovy  - RTC 4-5 months

## 2021-08-31 NOTE — Progress Notes (Incomplete)
[facility]   [place], [place] [address]  [phone]     Primary MD: Betha Loa A  Consult Requested By: Alto Denver Rajul    Last office visit 04/29/2021 with me:  - Recommend lotions or cream with ammonium lactate or salicylic acid   - For bilateral elbows and knees,, when flaring, use triamcinolone cream twice daily for 5 days taking a break on weekends.   - For bilateral arms and knees, start calcipotriene 0.0005% ointment twice daily on the weekends   - For the scalp, continue ketoconazole shampoo 2-3x weekly   - Start clobetasol 0.05% solution to the scalp twice daily for 1-2 weeks     Chief complaint: Dermatitis follow up    HISTORY:  Ruth Hall is a 36 year old female here for follow up evaluation of dermatitis.     Lesion of concern today:   - ***    Otherwise no other itching, bleeding, painful, growing, ulcerating, or concerning lesions.    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    She has no past medical history of Acute deep vein thrombosis of proximal leg (CMS-HCC), AF (atrial fibrillation) (CMS-HCC), Allergic rhinitis, Anemia, Asthma, Clotting disorder (CMS-HCC), Congestive heart failure (CMS-HCC), COPD (chronic obstructive pulmonary disease) (CMS-HCC), Diabetes mellitus (CMS-HCC), GERD (gastroesophageal reflux disease), Glaucoma, Heart disease, HIV disease (CMS-HCC), Hypercholesterolemia, Hypertension, Hypothyroidism, IBD (inflammatory bowel disease), Ischemic heart disease, Major depressive disorder, single episode, OSA (obstructive sleep apnea), Pneumonia, Polyarthropathy or polyarthritis of multiple sites,  Seizures (CMS-HCC), or UTI (urinary tract infection).      MEDICATIONS:  Medications:   Current Outpatient Medications:   .  calcipotriene (DOVONEX) 0.005 % ointment, Apply topically to affected area twice a day on the weekends to affected areas on the elbows and knees, Disp: 60 g, Rfl: 5  .  Citalopram Hydrobromide (CELEXA PO), , Disp: , Rfl:   .  clobetasol propionate (TMOVATE) 0.05 % solution, Apply a small amount to the scalp twice daily for 2 weeks., Disp: 25 mL, Rfl: 1  .  escitalopram (LEXAPRO) 10 MG tablet, Take 1 tablet (10 mg) by mouth daily., Disp: 30 tablet, Rfl: 2  .  ketoconazole (NIZORAL) 2 % shampoo, APPLY TOPICALLY 1 APPLICATION ONCE FOR 1 DOSE, Disp: 120 mL, Rfl: 1  .  phentermine 30 MG capsule, Take 1 capsule (30 mg) by mouth every morning., Disp: 90 capsule, Rfl: 0  .  Semaglutide-Weight Management (WEGOVY) 0.25 MG/0.5ML SOAJ, Inject 0.25 mg under the skin once a week for 28 days., Disp: 2 mL, Rfl: 0  .  [START ON 09/01/2021] Semaglutide-Weight Management (WEGOVY) 0.5 MG/0.5ML SOAJ, Inject 0.5 mg under the skin once a week for 28 days., Disp: 2 mL, Rfl: 0  .  [START ON 09/29/2021] Semaglutide-Weight Management (WEGOVY) 1 MG/0.5ML SOAJ, Inject 1 mg under the skin once a week for 28 days., Disp: 2 mL, Rfl: 0  .  [START ON 10/27/2021] Semaglutide-Weight Management (WEGOVY) 1.7 MG/0.75ML SOAJ, Inject 1.7 mg under the skin once a week for 28 days., Disp: 3 mL, Rfl: 0  .  [  START ON 11/24/2021] Semaglutide-Weight Management (WEGOVY) 2.4 MG/0.75ML SOAJ, Inject 2.4 mg under the skin once a week., Disp: 3 mL, Rfl: 2  .  topiramate (TOPAMAX) 25 MG tablet, Take 1 tablet (25 mg) by mouth daily. Every evening, Disp: 90 tablet, Rfl: 2  .  triamcinolone (KENALOG) 0.1 % cream, Apply 1 Application topically 2 times daily. Apply a thin layer as directed, Disp: 45 g, Rfl: 0    Allergies: Penicillins    REVIEW OF SYSTEMS:  CONSTITUTIONAL: negative for fever or chills  DERMATOLOGIC: Feels well, no other skin  complaints.      PHYSICAL EXAM:  Skin Type:                    2  General Appearance: within normal limits, WDWN  Neuro: Alert and oriented x 3  Psych: Mood and affect within normal limits  Eyes: Inspection of lids, sclera, and conjunctiva within normal limits     Skin: The areas examined included *** scalp and face, neck, RUE, LUE, chest, abdomen, back, RLE, LLE, buttock     Pertinent findings below:  -***  - Gritty erythematous macules of the ***  - Crusted erythematous stuck on papule on the ***   - Scattered even-bordered, even-pigmented macules and papules on the head, trunk and extremities  - Feathery brown macules on the face, forearms and shoulders  - Well demarcated, brown, stuck on appearing plaques on head, trunk and extremities  - Scattered 2-4 mm cherry red macules  - Yellow papules with central dell on the face ***  - Pink brown papule with + dimple sign on the ***  - Dyschromia, thinning, loss of elasticity, and wrinkling of the skin in sun exposed areas    ***  - Mild erythema of posterior occipital scalp   - Very thin scaly plaques on bilateral elbows  - Knees with erythematous to hyperpigmented patches       ASSESSMENT AND TREATMENT PLAN:    Dermatitis, most likely psoriasis given distribution but today is a good day and the active areas are very mild  Diagnoses, natural course, treatments and risks were discussed with the patient   - Discussed etiology of conditions. Reviewed effects of diet.   - Recommend lotions or creams with ammonium lactate or salicylic acid and regular application.  - For the bilateral elbows and knees, when flaring can use triamcinolone 0.1% cream twice daily for 5 days (M-F). Please take breaks on weekends. Patient already has rx.   - For the bilateral arms and knees, please start calcipotriene 0.005% ointment  twice daily only on the weekends scheduled as ppx  - For the scalp, can continue ketoconazole shampoo as directed 2-3 times weekly. Please let sit for 5-10 minutes.  Patient already has prescription.   - start Clobetasol 0.05% solution, applying to the scalp twice daily for 1-2 weeks. Please use a small amount. Please stop use after two weeks.   -- Side effects of topical steroids include atrophy, telangiectasias, acne, and dyspigmentation.  Note: If applied on eyelids, note long term risk of steroid application near the eyes: cataracts and glaucoma.  - We discussed the systemic findings in psoriasis as well including association with metabolic syndrome and arthritis; pt to follow up with PCP regularly and let us know if she develops joint pain  ________________________________      Neoplasm of uncertain behavior skin *** (A) r/o ***  Site: ***  Written and verbal, witnessed informed consent was obtained.  Risks of biopsy discussed including scar, dyspigmentation, pain, recurrence, nerve damage. A time out period was observed to identify the correct location of the lesion(s), name of procedure, patient's name and DOB.  Each biopsy site was prepped with an alcohol pad.  Each area was infiltrated with 0.5 cc of 0.5% lidocaine with 1:200,000 epinephrine.  A shave biopsy was perfomed using a dermablade.  Hemostasis achieved with DrySol.  The biopsy site(s) was allowed to heal by secondary intention.  The patient was advised to clean each area with soap and water, apply petrolatum ointment afterwards, and to cover with a bandaid daily.  Patient was advised to call our office for any complications.  Patient gave verbal permission to leave the results of the biopsy on their answering machine in the event that they were not available to take the call. The pathology results will be discussed with the patient when they become available. Patient verbally consents to [facility] communication of biopsy results.    Actinic keratosis of ***   - As noted in exam  - Discussed with patient the premalignant nature of these lesions. After discussion of treatment options and risks, benefits,  alternatives of cryotherapy, patient was verbally consented for treatment with liquid nitrogen cryotherapy including atrophy, scar, dyspigmentation, pain, recurrence and incomplete treatment.  Liquid nitrogen x 5-7 seconds x 2 cycles to *** lesions on the ***.  Tolerated without complication. Expectations of some redness, blistering discussed. Patient was instructed not pick at the areas. Instructed patient to return if lesions fail to fully resolve.     Inflamed Seborrheic Keratosis  - As noted in exam  - Discussed with patient the benign but irritated nature of these lesions. After discussion of treatment options and risks, benefits, alternatives of cryotherapy, patient was verbally consented for treatment with liquid nitrogen cryotherapy including atrophy, scar, dyspigmentation, pain, recurrence and incomplete treatment.  Liquid nitrogen x 5-7 seconds x 2 cycles to *** lesion(s) on the ***.  Tolerated without complication. Expectations of some redness, blistering discussed. Patient was instructed not pick at the areas. Instructed patient to return if lesions fail to fully resolve.      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  Best sunscreens use zinc or titanium oxide as the active ingredient and act as a physical blocker.   - ABCDEs of melanoma reviewed; Signs of new and recurrent NMSC discussed with patient       Return to clinic: *** month follow up, or sooner for any concerns      Alto Denver MD     Scribe Attestation  The notes I am recording reflect only actions made by and judgments taken  by this provider, Dr. Posey Pronto, for whom I am scribing today.  I have performed no independent clinical work.    Kathrina Silvino    ____________________________________________________________________    {Double click to enter provider attestation for scribed [id number]}

## 2021-09-01 ENCOUNTER — Ambulatory Visit (INDEPENDENT_AMBULATORY_CARE_PROVIDER_SITE_OTHER): Payer: HMO | Admitting: Dermatology

## 2021-09-01 DIAGNOSIS — L309 Dermatitis, unspecified: Secondary | ICD-10-CM

## 2021-09-17 ENCOUNTER — Ambulatory Visit (INDEPENDENT_AMBULATORY_CARE_PROVIDER_SITE_OTHER): Payer: HMO | Admitting: Family Medicine

## 2021-09-17 ENCOUNTER — Other Ambulatory Visit: Payer: Self-pay

## 2021-09-17 ENCOUNTER — Encounter (INDEPENDENT_AMBULATORY_CARE_PROVIDER_SITE_OTHER): Payer: Self-pay | Admitting: Family Medicine

## 2021-09-17 VITALS — BP 109/75 | HR 104 | Temp 99.0°F | Ht 60.0 in | Wt 184.6 lb

## 2021-09-17 DIAGNOSIS — Z723 Lack of physical exercise: Secondary | ICD-10-CM | POA: Insufficient documentation

## 2021-09-17 DIAGNOSIS — M545 Low back pain, unspecified: Secondary | ICD-10-CM

## 2021-09-17 DIAGNOSIS — Z23 Encounter for immunization: Secondary | ICD-10-CM

## 2021-09-17 MED ORDER — IBUPROFEN 600 MG OR TABS
600.0000 mg | ORAL_TABLET | Freq: Four times a day (QID) | ORAL | 0 refills | Status: DC | PRN
Start: 2021-09-17 — End: 2022-10-16

## 2021-09-17 MED ORDER — CYCLOBENZAPRINE HCL 10 MG OR TABS
10.0000 mg | ORAL_TABLET | Freq: Three times a day (TID) | ORAL | 0 refills | Status: DC | PRN
Start: 2021-09-17 — End: 2022-10-16

## 2021-09-17 NOTE — Addendum Note (Signed)
Addended by: Betha Loa on: 09/17/2021 02:42 PM     Modules accepted: Orders

## 2021-09-17 NOTE — Progress Notes (Signed)
Ruth Hall is a 36yo G1P1001, who presents for low back pain.  Attended concert one month ago, running down stairs and fell on her bottom.  Still with pain four weeks later.  Works 8 hours per day, has to sit all day as Chiropractor.    No numbness, tingling, weakness.  No radiation.  Medication left over from surgery has been helping, but worried about taking meds    Patient Active Problem List   Diagnosis   . ASCUS (atypical squamous cells of undetermined significance) on Pap smear   . BMI 35.0-35.9,adult - on Wegovy   . Anxiety   . Herpes simplex infection of genitourinary system   . Hx subcutaneous endometriosis s/p excision     I reviewed the patient's medical record, including PMH, SH, medications, allergies, and ROS.    Vitals:    09/17/21 1410   BP: 109/75   BP Location: Left arm   BP Patient Position: Sitting   BP cuff size: Regular   Pulse: 104   Temp: 99 F (37.2 C)   TempSrc: Temporal   SpO2: 100%   Weight: 83.7 kg (184 lb 9.6 oz)   Height: 5' (1.524 m)     Physical Exam:   General Appearance: healthy, alert, no distress, pleasant affect, cooperative.  Back: no midline tenderness, slight left sided paraspinal muscle tenderness.  Neuro: non-focal exam    ASSESSMENT/PLAN:    Ruth Hall was seen today for fall major.    Diagnoses and all orders for this visit:    Left-sided low back pain without sciatica, unspecified chronicity  -     cyclobenzaprine (FLEXERIL) 10 MG tablet; Take 1 tablet (10 mg) by mouth every 8 hours as needed for Muscle Spasms.  -     ibuprofen (MOTRIN) 600 MG tablet; Take 1 tablet (600 mg) by mouth every 6 hours as needed for Mild Pain (Pain Score 1-3) or Moderate Pain (Pain Score 4-6).  -     Physical Therapy - Outside (Non-[place])    Need for vaccination  -     Tdap (BOOSTRIX or Adacel) for patients 45-6 years old  -     Gardasil-9 HPV Vaccine 3-Dose  (Admin now)  -     Gardasil-9 HPV Vaccine 3-Dose  (Admin in 1 to 2 mon); Future  -     Gardasil-9 HPV Vaccine 3-Dose (Admin in  6 mon); Future

## 2021-10-01 ENCOUNTER — Other Ambulatory Visit (INDEPENDENT_AMBULATORY_CARE_PROVIDER_SITE_OTHER): Payer: Self-pay | Admitting: Family Medicine

## 2021-10-01 DIAGNOSIS — R21 Rash and other nonspecific skin eruption: Secondary | ICD-10-CM

## 2021-10-01 DIAGNOSIS — L299 Pruritus, unspecified: Secondary | ICD-10-CM

## 2021-10-01 MED ORDER — KETOCONAZOLE 2 % EX SHAM
MEDICATED_SHAMPOO | CUTANEOUS | 1 refills | Status: DC
Start: 2021-10-01 — End: 2022-02-27

## 2021-10-01 MED ORDER — TRIAMCINOLONE ACETONIDE 0.1 % EX CREA
1.00 | TOPICAL_CREAM | Freq: Two times a day (BID) | CUTANEOUS | 0 refills | Status: DC
Start: 2021-10-01 — End: 2022-04-22

## 2021-10-01 NOTE — Telephone Encounter (Signed)
[facility]     ketoconazole  is not currently included in the Pharmacy Refill Clinic protocols. Re-routing to the responsible staff for processing.  Thank you.

## 2021-10-01 NOTE — Telephone Encounter (Signed)
[facility]     triamcinolone  is not currently included in the Pharmacy Refill Clinic protocols. Re-routing to the responsible staff for processing.  Thank you

## 2021-10-13 ENCOUNTER — Encounter (INDEPENDENT_AMBULATORY_CARE_PROVIDER_SITE_OTHER): Payer: Self-pay | Admitting: Endocrinology

## 2021-10-13 ENCOUNTER — Encounter (INDEPENDENT_AMBULATORY_CARE_PROVIDER_SITE_OTHER): Payer: Self-pay | Admitting: Hospital

## 2021-10-13 ENCOUNTER — Encounter (INDEPENDENT_AMBULATORY_CARE_PROVIDER_SITE_OTHER): Payer: Self-pay

## 2021-10-13 DIAGNOSIS — E6609 Other obesity due to excess calories: Secondary | ICD-10-CM

## 2021-10-21 ENCOUNTER — Telehealth (INDEPENDENT_AMBULATORY_CARE_PROVIDER_SITE_OTHER): Payer: Self-pay | Admitting: Endocrinology

## 2021-10-21 DIAGNOSIS — E6609 Other obesity due to excess calories: Secondary | ICD-10-CM

## 2021-10-21 NOTE — Telephone Encounter (Signed)
Caller: Patient   Relationship to patient: self  Phone # [phone]  Preferred Method of Communication: call    Notes: Patient calling requesting a refill on medication of phentermine. In her last visit it was dc'd in order for her to start wegovy but wegovy was not convered by insurance so she decided to stay on phentermine.  Can patient get an rx sent to :  [id number] [address]  [address] [address]  Phone: [phone]: [phone]  Hours: Mon-Fri 8am-9pm, Sat 9am-6pm, Sun 10am-6pm               Please assist     Caller has been advised of  72 hr turnaround time.

## 2021-10-21 NOTE — Telephone Encounter (Signed)
Left vm informing pt that Dr. Raliegh Ip is out of the office for 2 weeks and will send to her to sent new phentermine script when she returns.    LOV: 08/04/2021  NOV: 05/20/2022

## 2021-10-23 MED ORDER — PHENTERMINE HCL 30 MG OR CAPS
30.00 mg | ORAL_CAPSULE | Freq: Every morning | ORAL | 0 refills | Status: DC
Start: 2021-10-23 — End: 2022-05-20

## 2021-10-23 NOTE — Telephone Encounter (Signed)
Phentermine refilled

## 2021-11-17 ENCOUNTER — Other Ambulatory Visit (INDEPENDENT_AMBULATORY_CARE_PROVIDER_SITE_OTHER): Payer: Self-pay | Admitting: Family Medicine

## 2021-11-17 DIAGNOSIS — F418 Other specified anxiety disorders: Secondary | ICD-10-CM

## 2021-11-18 NOTE — Telephone Encounter (Signed)
Ruth Hall, CPhT  ([facility] Ref[facility])      Serotonin Reuptake Inhibitor Refill Protocol (or Trazodone or Buspar)    Last visit in enc specialty: 09/17/2021     Recent Visits in This Encounter Department       Date Provider Department Visit Type Primary Dx    09/17/2021 Truddie Hidden, MD [facility] Office Visit Left-sided low back pain without sciatica, unspecified chronicity    06/13/2021 Titus Mould, PSYD [facility] Generalized anxiety disorder    06/06/2021 Titus Mould, PSYD [facility] Generalized anxiety disorder    05/30/2021 Titus Mould, PSYD [facility] Generalized anxiety disorder    05/23/2021 Titus Mould, PSYD [facility] Generalized anxiety disorder           Population Health Visits  Recent [facility] Visits    None       Next f/u appt due:  none  Next appt in enc specialty: Visit date not found      Future Appointments 11/18/2021 - 11/17/2026        Date Visit Type Department Provider     04/22/2022  9:00 AM [facility] [address] [facility], Gaspar Skeeters, MD    Appointment Notes:     dermatitis             05/20/2022 12:20 PM [facility] Sherian Rein, MD    Appointment Notes:     4-6 month f/u                        LABS required:  (Q yr SCr (paxil only; lexapro removed creat req on 12/25/19))    Lab Results   Component Value Date    NA 142 08/20/2020    K 4.6 08/20/2020    CL 106 08/20/2020    BICARB 25 08/20/2020    BUN 12 08/20/2020    CREAT 0.82 08/20/2020       Lab Results   Component Value Date    GFRNON >60 08/20/2020    GFRAA >60 03/12/2006    EGFRCKDEPI >60 08/20/2020         Monitoring required:  (Q year BP, HR)   *EKG/QTc at appropriate interval for irregular HR in pts w CVD and/or risk factors assoc w   QTc prolongation (High Dose/DDi/Age etc)*    (BP range: MGNOIBBC=48-889  VQXIHWTUU=82-80)  Blood Pressure   09/17/21 109/75   05/15/21 119/83   04/08/21 122/78       (HR range: 55-110)  Pulse Readings from Last 3 Encounters:   09/17/21 104   05/15/21 89   04/08/21 72         Last Digital Health Monitoring Vitals:        Last MyChart BP Values:        Last Pt Entered MyChart BP Values:      -----------------------------------------------------------------------------------------------------------    The above technician note was evaluated by the pharmacist.  Pharmacist Assessment and Plan:    90 + 1 RF acute visit 09/17/21    Lauralyn Primes, PharmD, [facility]   Refill and [facility] [place]  Phone:  [phone]  Ext:  [phone]

## 2021-11-19 MED ORDER — ESCITALOPRAM OXALATE 10 MG OR TABS
10.00 mg | ORAL_TABLET | Freq: Every day | ORAL | 1 refills | Status: DC
Start: 2021-11-19 — End: 2022-09-25

## 2021-11-23 ENCOUNTER — Other Ambulatory Visit (INDEPENDENT_AMBULATORY_CARE_PROVIDER_SITE_OTHER): Payer: Self-pay | Admitting: Family Medicine

## 2021-11-23 DIAGNOSIS — F418 Other specified anxiety disorders: Secondary | ICD-10-CM

## 2021-11-24 NOTE — Telephone Encounter (Signed)
[facility] on [facility]/23/203 [facility]/1 refill(s) (valid for 6 months).

## 2022-02-27 ENCOUNTER — Other Ambulatory Visit (INDEPENDENT_AMBULATORY_CARE_PROVIDER_SITE_OTHER): Payer: Self-pay | Admitting: Family Medicine

## 2022-02-27 DIAGNOSIS — L299 Pruritus, unspecified: Secondary | ICD-10-CM

## 2022-02-27 MED ORDER — KETOCONAZOLE 2 % EX SHAM
1.0000 | MEDICATED_SHAMPOO | Freq: Once | CUTANEOUS | 1 refills | Status: DC
Start: 2022-02-27 — End: 2022-04-23

## 2022-02-27 NOTE — Telephone Encounter (Signed)
Incoming call from patient requesting refill    Established with: Truddie Hidden   Last OV with PCP: 09/17/2021   Next OV with PCP: Visit date not found   Last OV in department: 09/17/2021   Next OV in department: Visit date not found    Requested Medication(s):  Requested Prescriptions     Pending Prescriptions Disp Refills    ketoconazole (NIZORAL) 2 % shampoo 120 mL 1     Sig: Apply 1 Application. topically once for 1 dose.     Allergies   Allergen Reactions    Penicillins Hives     Other reaction(s): Hives        [facility]     [id number] [place] [place] [address] Phone: [phone]: [phone]        Last labs:   Lab Results   Component Value Date    CHOL 159 08/30/2020    HDL 45 08/30/2020    LDLCALC 83 08/30/2020    TRIG 156 08/30/2020    TSH 1.13 08/20/2020    A1C 5.6 08/20/2020      Blood Pressure   09/17/21 109/75   05/15/21 119/83   04/08/21 122/78      Health Maintenance Due   Topic Date Due    Tetanus (5 - Tdap) 04/22/2006    HPV Vaccine <= 26 Yrs (2 - 3-dose series) 06/02/2010    Influenza (1) Never done    COVID-19 Vaccine (4 - 2023-24 season) 11/28/2021    PHQ9 Depression Monitoring doc flowsheet  01/17/2022        Current Medication(s):   Current Outpatient Medications   Medication Sig Dispense Refill    calcipotriene (DOVONEX) 0.005 % ointment Apply topically to affected area twice a day on the weekends to affected areas on the elbows and knees 60 g 5    cyclobenzaprine (FLEXERIL) 10 MG tablet Take 1 tablet (10 mg) by mouth every 8 hours as needed for Muscle Spasms. 15 tablet 0    escitalopram (LEXAPRO) 10 MG tablet Take 1 tablet (10 mg) by mouth daily. 90 tablet 1    ibuprofen (MOTRIN) 600 MG tablet Take 1 tablet (600 mg) by mouth every 6 hours as needed for Mild Pain (Pain Score 1-3) or Moderate Pain (Pain Score 4-6). 30 tablet 0    ketoconazole (NIZORAL) 2 % shampoo APPLY TOPICALLY 1 APPLICATION ONCE FOR 1 DOSE 120 mL 1    phentermine  30 MG capsule Take 1 capsule (30 mg) by mouth every morning. 90 capsule 0    topiramate (TOPAMAX) 25 MG tablet Take 1 tablet (25 mg) by mouth daily. Every evening 90 tablet 2    triamcinolone (KENALOG) 0.1 % cream APPLY 1 APPLICATION TOPICALLY 2 TIMES DAILY. APPLY A THIN LAYER AS DIRECTED 45 g 0     No current facility-administered medications for this visit.        Encounter created by Care Assist MA.  If further action required please route encounter to appropriate in clinic MA/LVN/Resident pool.

## 2022-02-27 NOTE — Telephone Encounter (Signed)
[facility]     Nizoral is not currently included in the Pharmacy Refill Clinic protocols. Re-routing to the responsible staff for processing.  Thank you

## 2022-03-27 ENCOUNTER — Encounter (INDEPENDENT_AMBULATORY_CARE_PROVIDER_SITE_OTHER): Payer: Self-pay

## 2022-03-27 ENCOUNTER — Encounter (INDEPENDENT_AMBULATORY_CARE_PROVIDER_SITE_OTHER): Payer: Self-pay | Admitting: Dermatology

## 2022-03-27 DIAGNOSIS — D229 Melanocytic nevi, unspecified: Secondary | ICD-10-CM

## 2022-03-27 DIAGNOSIS — L989 Disorder of the skin and subcutaneous tissue, unspecified: Secondary | ICD-10-CM

## 2022-03-27 DIAGNOSIS — Z1283 Encounter for screening for malignant neoplasm of skin: Secondary | ICD-10-CM

## 2022-04-03 ENCOUNTER — Encounter: Payer: Self-pay | Admitting: Hospital

## 2022-04-20 NOTE — Progress Notes (Signed)
[facility]   [place], [place] [address]  [phone]     Primary MD: Betha Loa A  Consult Requested By: Alto Denver Rajul      Last office visit on 04/29/2021 with me:  - For dermatitis, for the bilateral elbows and knees, when flaring can use triamcinolone 0.1% cream twice daily for 5 days (M-F). Please take breaks on weekends. Patient already has rx.   -- For the bilateral arms and knees, please start calcipotriene 0.005% ointment  twice daily only on the weekends scheduled as ppx  -- For the scalp, can continue ketoconazole shampoo as directed 2-3 times weekly. Please let sit for 5-10 minutes.   -- Start Clobetasol 0.05% solution, applying to the scalp twice daily for 1-2 weeks. Please use a small amount. Please stop use after two weeks.     Chief complaint: Dermatitis follow up     HISTORY:  Ruth Hall is a 37 year old female here for evaluation of dermatitis.     Lesion of concern today:   - Patient reports she does not feel like the treatment is working. Her scalp is flaring and itchy. She is using ketoconazole shampoo 3 times per week, but ran out of clobetasol solution. She reports the clobetasol solution worked well before she ran out and stopped using it. She notes her scalp gets really itchy at night. She reports her elbows and knees improved with the creams.     Otherwise no other itching, bleeding, painful, growing, ulcerating, or concerning lesions.    PAST DERM HISTORY:  Melanoma hx: negative  NMSC hx: negative    FAMILY HISTORY:  Family Hx of melanoma: negative  Family hx of NMSC: negative      PAST MEDICAL HISTORY:  The patient  has a past medical history of Blood transfusion declined because patient is Jehovah's Witness, HPV in female, Migraine, and Obesity.    She has no past medical history of Acute deep vein thrombosis of proximal leg (CMS-HCC), AF (atrial fibrillation) (CMS-HCC), Allergic rhinitis, Anemia, Asthma, Clotting disorder (CMS-HCC),  Congestive heart failure (CMS-HCC), COPD (chronic obstructive pulmonary disease) (CMS-HCC), Diabetes mellitus (CMS-HCC), GERD (gastroesophageal reflux disease), Glaucoma, Heart disease, HIV disease (CMS-HCC), Hypercholesterolemia, Hypertension, Hypothyroidism, IBD (inflammatory bowel disease), Ischemic heart disease, Major depressive disorder, single episode, OSA (obstructive sleep apnea), Pneumonia, Polyarthropathy or polyarthritis of multiple sites, Seizures (CMS-HCC), or UTI (urinary tract infection).      MEDICATIONS:  Medications:   Current Outpatient Medications:     calcipotriene (DOVONEX) 0.005 % ointment, Apply topically to affected area twice a day on the weekends to affected areas on the elbows and knees, Disp: 60 g, Rfl: 5    cyclobenzaprine (FLEXERIL) 10 MG tablet, Take 1 tablet (10 mg) by mouth every 8 hours as needed for Muscle Spasms., Disp: 15 tablet, Rfl: 0    escitalopram (LEXAPRO) 10 MG tablet, Take 1 tablet (10 mg) by mouth daily., Disp: 90 tablet, Rfl: 1    ibuprofen (MOTRIN) 600 MG tablet, Take 1 tablet (600 mg) by mouth every 6 hours as needed for Mild Pain (Pain Score 1-3) or Moderate Pain (Pain Score 4-6)., Disp: 30 tablet, Rfl: 0    phentermine 30 MG capsule, Take 1 capsule (30 mg) by mouth every morning., Disp: 90 capsule, Rfl: 0    topiramate (TOPAMAX) 25 MG tablet, Take 1 tablet (25 mg) by mouth daily. Every evening, Disp: 90 tablet, Rfl: 2    triamcinolone (KENALOG) 0.1 % cream,  APPLY 1 APPLICATION TOPICALLY 2 TIMES DAILY. APPLY A THIN LAYER AS DIRECTED, Disp: 45 g, Rfl: 0    Allergies: Penicillins    REVIEW OF SYSTEMS:  CONSTITUTIONAL: negative for fever or chills  DERMATOLOGIC: Feels well, no other skin complaints.      PHYSICAL EXAM:  General Appearance: within normal limits, WDWN  Neuro: Alert and oriented x 3  Psych: Mood and affect within normal limits  Eyes: Inspection of lids, sclera, and conjunctiva within normal limits     Skin: The areas examined included face, scalp, lower  extremities     Pertinent findings below:  - Erythema of the bilateral elbows and knees 2 mm scaly plaques on the knee  - Erythematous pink plaques on the bilateral temporal scalp      ASSESSMENT AND TREATMENT PLAN:     Psoriasis, flaring on the scalp, improved on the body  - Diagnoses, natural course, treatments and risks were discussed with the patient   - Discussed etiology of conditions. Reviewed effects of diet.   - Recommend lotions or creams with ammonium lactate or salicylic acid and regular application.  - For the bilateral elbows and knees, when flaring can use triamcinolone 0.1% cream twice daily for 5 days (M-F). Please take breaks on weekends.   - For the bilateral arms and knees, please continue calcipotriene 0.005% ointment  twice daily only on the weekends scheduled as ppx  - For the scalp, can continue ketoconazole shampoo as directed 1-2 times weekly. Please let sit for 5-10 minutes.    - Restart Clobetasol 0.05% solution, once or twice per week to the scalp to prevent flares. When flaring, using once daily M-F, taking a break on the weekends.   -- Side effects of topical steroids include atrophy, telangiectasias, acne, and dyspigmentation.  Note: If applied on eyelids, note long term risk of steroid application near the eyes: cataracts and glaucoma.  - Refills provided today.     Return to clinic: 5 month follow up, or sooner for any concerns      Alto Denver MD     Scribe Attestation  The notes I am recording reflect only actions made by and judgments taken by this provider, Dr. Posey Pronto, for whom I am scribing today.  I have performed no independent clinical work.    Paulina Fusi    ____________________________________________________________________    Provider Attestation for Scribed Note    As the attending provider, I agree with the scribed content.  Any changes or edits are noted in the text above.     Sachi Rajul Posey Pronto

## 2022-04-22 ENCOUNTER — Encounter (INDEPENDENT_AMBULATORY_CARE_PROVIDER_SITE_OTHER): Payer: Self-pay

## 2022-04-22 ENCOUNTER — Ambulatory Visit (INDEPENDENT_AMBULATORY_CARE_PROVIDER_SITE_OTHER): Payer: HMO | Admitting: Dermatology

## 2022-04-22 ENCOUNTER — Other Ambulatory Visit (INDEPENDENT_AMBULATORY_CARE_PROVIDER_SITE_OTHER): Payer: Self-pay | Admitting: Family Medicine

## 2022-04-22 DIAGNOSIS — L409 Psoriasis, unspecified: Secondary | ICD-10-CM

## 2022-04-22 DIAGNOSIS — L309 Dermatitis, unspecified: Secondary | ICD-10-CM

## 2022-04-22 DIAGNOSIS — L299 Pruritus, unspecified: Secondary | ICD-10-CM

## 2022-04-22 MED ORDER — TRIAMCINOLONE ACETONIDE 0.1 % EX CREA
1.0000 | TOPICAL_CREAM | Freq: Two times a day (BID) | CUTANEOUS | 0 refills | Status: AC
Start: 2022-04-22 — End: ?

## 2022-04-22 MED ORDER — CLOBETASOL PROPIONATE 0.05 % EX SOLN
CUTANEOUS | 11 refills | Status: AC
Start: 2022-04-22 — End: ?

## 2022-04-22 NOTE — Patient Instructions (Signed)
-  Recommend lotions or creams with ammonium lactate or salicylic acid and regular application.  - For the bilateral elbows and knees, when flaring can use triamcinolone 0.1% cream twice daily for 5 days (M-F). Please take breaks on weekends.   - For the bilateral arms and knees, please continue calcipotriene 0.005% ointment twice daily only on the weekends    - For the scalp, can continue ketoconazole shampoo as directed 1-2 times weekly. Please let sit for 5-10 minutes.    - Continue Clobetasol 0.05% solution, once or twice per week to the scalp to prevent flares. When flaring, using once daily M-F, taking a break on the weekends.

## 2022-04-23 MED ORDER — KETOCONAZOLE 2 % EX SHAM
1.00 | MEDICATED_SHAMPOO | Freq: Once | CUTANEOUS | 0 refills | Status: DC
Start: 2022-04-23 — End: 2022-06-18

## 2022-04-23 NOTE — Telephone Encounter (Signed)
[facility]     ketoconazole  is not currently included in the Pharmacy Refill Clinic protocols. Re-routing to the responsible staff for processing.  Thank you.

## 2022-04-23 NOTE — Telephone Encounter (Signed)
Established with:  Last OV with PCP:  Next OV with Dept:  Future Appointments: Truddie Hidden   Visit date not found  Visit date not found  Future Appointments   Date Time Provider [place]   05/20/2022 12:20 PM Tantisira, Tammi Klippel, MD [facility]        Medication requested:   Requested Prescriptions     Pending Prescriptions Disp Refills    ketoconazole (NIZORAL) 2 % shampoo [Pharmacy Med Name: KETOCONAZOLE 2% SHAMPOO] 120 mL 1     Sig: APPLY 1 APPLICATION. TOPICALLY ONCE FOR 1 DOSE.       Last Filled Date 02/27/22   Quantity Last Filled     Refill    Send to:    [id number] [address] [place] [id number] Phone: [phone]: [phone]   [id number] [place] [id number]: [phone]: [phone]    Current Medication(s):  Current Outpatient Medications   Medication Sig Dispense Refill    calcipotriene (DOVONEX) 0.005 % ointment Apply topically to affected area twice a day on the weekends to affected areas on the elbows and knees 60 g 5    clobetasol propionate (TMOVATE) 0.05 % solution Use a small amount as directed once or twice per week to the scalp to prevent flares. When flaring, using once daily M-F, taking a break on the weekends. 50 mL 11    cyclobenzaprine (FLEXERIL) 10 MG tablet Take 1 tablet (10 mg) by mouth every 8 hours as needed for Muscle Spasms. 15 tablet 0    escitalopram (LEXAPRO) 10 MG tablet Take 1 tablet (10 mg) by mouth daily. 90 tablet 1    ibuprofen (MOTRIN) 600 MG tablet Take 1 tablet (600 mg) by mouth every 6 hours as needed for Mild Pain (Pain Score 1-3) or Moderate Pain (Pain Score 4-6). 30 tablet 0    phentermine 30 MG capsule Take 1 capsule (30 mg) by mouth every morning. 90 capsule 0    topiramate (TOPAMAX) 25 MG tablet Take 1 tablet (25 mg) by mouth daily. Every evening 90 tablet 2    triamcinolone (KENALOG) 0.1 % cream Apply 1  Application. topically 2 times daily. Apply a thin layer as directed 45 g 0     No current facility-administered medications for this visit.       Patient information: Allergies   Allergen Reactions    Penicillins Hives     Other reaction(s): Hives        Health Maintenance Due   Topic Date Due    Tetanus (5 - Tdap) 04/22/2006    HPV Vaccine <= 26 Yrs (2 - 3-dose series) 06/02/2010    Influenza (1) Never done    COVID-19 Vaccine (4 - 2023-24 season) 11/28/2021      Last labs:  Lab Results   Component Value Date    CHOL 159 08/30/2020    HDL 45 08/30/2020    LDLCALC 83 08/30/2020    TRIG 156 08/30/2020    TSH 1.13 08/20/2020    A1C 5.6 08/20/2020      Blood Pressure   09/17/21 109/75   05/15/21 119/83   04/08/21 122/78    No components found for: "45M"

## 2022-04-23 NOTE — Telephone Encounter (Signed)
Covering for primary care provider. Temporary Prescription refilled.    Zainab Crumrine NP

## 2022-04-29 ENCOUNTER — Encounter (INDEPENDENT_AMBULATORY_CARE_PROVIDER_SITE_OTHER): Payer: Self-pay | Admitting: Hospital

## 2022-05-20 ENCOUNTER — Encounter (INDEPENDENT_AMBULATORY_CARE_PROVIDER_SITE_OTHER): Payer: Self-pay

## 2022-05-20 ENCOUNTER — Encounter (INDEPENDENT_AMBULATORY_CARE_PROVIDER_SITE_OTHER): Payer: Self-pay | Admitting: Endocrinology

## 2022-05-20 ENCOUNTER — Telehealth (INDEPENDENT_AMBULATORY_CARE_PROVIDER_SITE_OTHER): Payer: HMO | Admitting: Endocrinology

## 2022-05-20 VITALS — Wt 187.0 lb

## 2022-05-20 DIAGNOSIS — R0683 Snoring: Secondary | ICD-10-CM

## 2022-05-20 DIAGNOSIS — Z6836 Body mass index (BMI) 36.0-36.9, adult: Secondary | ICD-10-CM

## 2022-05-20 DIAGNOSIS — R635 Abnormal weight gain: Secondary | ICD-10-CM

## 2022-05-20 DIAGNOSIS — E6609 Other obesity due to excess calories: Secondary | ICD-10-CM

## 2022-05-20 MED ORDER — SEMAGLUTIDE-WEIGHT MANAGEMENT 1 MG/0.5ML SC SOAJ
1.0000 mg | SUBCUTANEOUS | 0 refills | Status: DC
Start: 2022-05-25 — End: 2022-05-25

## 2022-05-20 MED ORDER — SEMAGLUTIDE-WEIGHT MANAGEMENT 0.5 MG/0.5ML SC SOAJ
0.5000 mg | SUBCUTANEOUS | 0 refills | Status: AC
Start: 2022-05-25 — End: 2022-06-22

## 2022-05-20 MED ORDER — SEMAGLUTIDE-WEIGHT MANAGEMENT 0.25 MG/0.5ML SC SOAJ
0.2500 mg | SUBCUTANEOUS | 0 refills | Status: AC
Start: 2022-05-25 — End: 2022-06-22

## 2022-05-20 MED ORDER — PHENTERMINE HCL 37.5 MG OR TABS
37.5000 mg | ORAL_TABLET | Freq: Every day | ORAL | 2 refills | Status: DC
Start: 2022-05-20 — End: 2022-10-29

## 2022-05-20 MED ORDER — TOPIRAMATE 25 MG OR TABS
50.0000 mg | ORAL_TABLET | Freq: Every day | ORAL | 2 refills | Status: DC
Start: 2022-05-20 — End: 2022-10-29

## 2022-05-20 NOTE — Progress Notes (Signed)
Subjective :  Ruth Hall is a 37 year old female who is here for Weight Problem    ---------------------(data below generated by Sherian Rein, MD)--------------------     Patient Verification & Telemedicine Consent & Financial Waiver:    1.   Identity: I have verified this patient's identity to be accurate.  2.   Consent: I verify consent has been secured in one of the following methods: (a) obtained written/ online attestation consent (via MyChartVideoVisit pathway), (b) the spoke-side provider has obtained verbal or written consent from patient/surrogate (if this is a "provider to provider" evaluation), or (c) in all other cases, I have personally obtained verbal consent from the patient/ surrogate (noting all elements below) to perform this voluntary telemedicine evaluation (including obtaining history, performing examination and reviewing data provided by the patient).   The patient/ surrogate has the right to refuse this evaluation.  I have explained risks (including potential loss of confidentiality), benefits, alternatives, and the potential need for subsequent face to face care. Patient/ surrogate understands that there is a risk of medical inaccuracies given that our recommendations will be made based on reported data (and we must therefore assume this information is accurate).  Knowing that there is a risk that this information is not reported accurately, and that the telemedicine video, audio, or data feed may be incomplete, the patient agrees to proceed with evaluation and holds [place] harmless knowing these risks.  3.   Healthcare Team: The patient/ surrogate has been notified that other healthcare professionals (including students, residents and Metallurgist) may be involved in this audio-video evaluation.   All laws concerning confidentiality and patient access to medical records and copies of medical records apply to telemedicine.  4.   Privacy: If this is a Radiographer, therapeutic  Visit, the patient/ surrogate has received the [place] Notice of Privacy Practices via E-Checkin process.  For all other video visit techniques, I have verbally provided the patient/ surrogate with the [place] in [place] ([email or web address]://[email or web address]) or Spanish ([email or web address]://[email or web address]://[email or web address].info/.aspx).  The patient/ surrogate acknowledges both being provided the NPP link, and has been offered to have the NPP mailed to the patient/ surrogate by [place] mail.  The patient/ surrogate has voiced understanding an acknowledgement of receipt of this NPP web address.  If the patient/surrogate has elected to receive the NPP via [place] mail, I verify that the NPP will be sent promptly to the patient/surrogate via [place] mail.  5.   Capacity: I have reviewed this above verification and consent paragraph with the patient/ surrogate and the patient is capacitated or has a surrogate. If the patient is not capacitated to understand the above, and no surrogate is available, since this is not an emergency evaluation, the visit will be rescheduled until such time that the patient can consent, or the surrogate is available to consent. If this is an emergency evaluation and the patient is not capacitated to understand the above, and no surrogate is available, I am proceeding with this evaluation as this is felt to be an emergency setting and no appropriate specialist is available at the bedside to perform these evaluations.  6.   Financial Waiver: If this is a Radiographer, therapeutic Visit, the patient has been made aware of the financial waiver via E-Checkin process.  For all other video visit techniques, an E-Checkin process is not performed.  As such, I have personally verbally informed the patient/ surrogate that this evaluation will be a billable encounter similar to an in-person clinic visit,  and the patient/ surrogate has agreed to pay the fee for services rendered.  If we are billing insurance for the patient's  telehealth visit, her out-of-pocket cost will be determined based on her plan and will be billed to her.  The patient/ surrogate has also been informed that if the patient does not have insurance or does not wish to use insurance, Ruth Google price for a primary care telehealth visit is $59.00 and specialist telehealth visit is $88.00.  I have further informed the patient/ surrogate that in the event the patient has additional services provided in conjunction with the specialty visit (Ex. Psychotherapy services), those services will be billed at the current rate less a 45% discount.  7.   Intra-State Location: The patient/ surrogate attests to understanding that if the patient accesses these services from a location outside of [place], that the patient does so at the patient's own risk and initiative and that the patient is ultimately responsible for compliance with any laws or regulations associated with the patient's use.  8.   Specific Use:The patient/ surrogate understands that [place] makes no representation that materials or servicesdelivered via telecommunication services, or listed on telemedicine websites, are appropriate or available for use in any other location.           Demographics:  Medical Record #: [id number]  Date: May 20, 2022  Patient Name: Ruth Hall  DOB: Mar 15, 1986  Age: 37 year old  Sex: female  Location: Home address on file  Patient seen Status: Patient was evaluated via telemedicine (audio or audio/video)     Evaluator(s):  Ruth Hall was evaluated by me today.    Clinic Location: [facility]  [address], [id number]  [place]  [address]                       HPI    Has always been struggling with her weight since age 25.   Lowest adult 150 lbs  Highest adult weight 250 lbs with pregnancy (2005)  Lost weight with phentermine 10 years ago -->165-170 lbs  1st visit 7/22 193 lbs--> started  phen/top.   176 lbs 11/22   Last visit 182 lbs 5/23. Wegovy prescribed but insurance did not cover.   Has been taking phentermine off and on. Stopped phentermine ~ 2 months as medication was not helping. Craving salty snacks, soda.   Continues taking topamax 25 mg at night for migraine.   Current weight 187 lbs    Recall  B: coffee  L: varies: burger or Tacos, coke  PM: chips  D: cereal  Night: another can of coke.     Exercise: walks a lot. Works for [employer].   Knee pain.     Does not sleep well. Waking herself up choking, snoring.     Past Medical History:   Diagnosis Date    Blood transfusion declined because patient is Jehovah's Witness     HPV in female     Migraine     Obesity          Review of Systems   Constitutional:  Positive for fatigue. Negative for diaphoresis and fever.   Respiratory:  Positive for shortness of breath. Negative for cough.    Cardiovascular:  Negative for chest pain and palpitations.   Gastrointestinal:  Negative for constipation and diarrhea.   Genitourinary:  Positive for frequency.   Musculoskeletal:  Positive for arthralgias.   Psychiatric/Behavioral:  Positive for sleep disturbance.     Remainder ROS neg      Current Outpatient Medications   Medication Sig Dispense Refill    calcipotriene (DOVONEX) 0.005 % ointment Apply topically to affected area twice a day on the weekends to affected areas on the elbows and knees 60 g 5    clobetasol propionate (TMOVATE) 0.05 % solution Use a small amount as directed once or twice per week to the scalp to prevent flares. When flaring, using once daily M-F, taking a break on the weekends. 50 mL 11    cyclobenzaprine (FLEXERIL) 10 MG tablet Take 1 tablet (10 mg) by mouth every 8 hours as needed for Muscle Spasms. 15 tablet 0    escitalopram (LEXAPRO) 10 MG tablet Take 1 tablet (10 mg) by mouth daily. 90 tablet 1    ibuprofen (MOTRIN) 600 MG tablet Take 1 tablet (600 mg) by mouth every 6 hours as needed for Mild Pain (Pain Score  1-3) or Moderate Pain (Pain Score 4-6). 30 tablet 0    phentermine (ADIPEX-P) 37.5 MG tablet Take 1 tablet (37.5 mg) by mouth every morning (before breakfast). 30 tablet 2    [START ON 05/25/2022] Semaglutide-Weight Management 0.25 MG/0.5ML SOAJ Inject 0.25 mg under the skin once a week for 28 days. 2 mL 0    [START ON 05/25/2022] Semaglutide-Weight Management 0.5 MG/0.5ML SOAJ Inject 0.5 mg under the skin once a week for 28 days. Start after 4 weeks of 0.25 mg wegovy 2 mL 0    [START ON 05/25/2022] Semaglutide-Weight Management 1 MG/0.5ML SOAJ Inject 1 mg under the skin once a week for 28 days. Start after 4 weeks of 0.5 mg wegovy 2 mL 0    topiramate (TOPAMAX) 25 MG tablet Take 2 tablets (50 mg) by mouth daily. Every evening 180 tablet 2    triamcinolone (KENALOG) 0.1 % cream Apply 1 Application. topically 2 times daily. Apply a thin layer as directed 45 g 0     No current facility-administered medications for this visit.     Allergies   Allergen Reactions    Penicillins Hives     Other reaction(s): Hives       Reviewed patients pertinent information related to social history, past medical, past surgical, and family history.     Objective :  Vital signs: Wt 84.8 kg (187 lb)   BMI 36.52 kg/m     Physical Exam  Constitutional:       General: She is not in acute distress.  Pulmonary:      Effort: Pulmonary effort is normal. No respiratory distress.   Neurological:      Mental Status: She is alert and oriented to person, place, and time.   Psychiatric:         Mood and Affect: Mood normal.         Behavior: Behavior normal.                05/20/2022    12:16 PM 09/17/2021     2:10 PM 08/04/2021    11:42 AM 05/15/2021    11:11 AM 04/08/2021     9:55 AM 02/10/2021    10:30 AM   Date Weight Recorded   Metric 84.823 kg 83.734 kg 82.555 kg 80.65 kg 82.555 kg 82.555 kg   Pounds/Ounces 187 lb 184 lb 9.6 oz 182 lb 177 lb 12.8 oz 182 lb 182 lb  Blood Pressure   09/17/21 109/75   05/15/21 119/83   04/08/21 122/78        Assessment/Plan:  Anyha was seen today for weight problem.    Diagnoses and all orders for this visit:    Class 2 obesity due to excess calories without serious comorbidity with body mass index (BMI) of 35.0 to 35.9 in adult  -     Glycosylated Hgb(A1C), Blood Lavender; Future  -     Comprehensive Metabolic Panel (Expected today and later); Future  -     Lipid Panel Green Plasma Separator Tube; Future  -     Vitamin B12, Blood Green Plasma Separator Tube; Future  -     Vitamin D, 25-OH Total Yellow serum separator tube; Future  -     CBC w/ Diff Lavender; Future  -     TSH, Blood - See Instructions; Future  -     Consult/Referral to Pulmonary and Sleep; Future  -     Semaglutide-Weight Management 0.25 MG/0.5ML SOAJ; Inject 0.25 mg under the skin once a week for 28 days.  -     Semaglutide-Weight Management 0.5 MG/0.5ML SOAJ; Inject 0.5 mg under the skin once a week for 28 days. Start after 4 weeks of 0.25 mg wegovy  -     Semaglutide-Weight Management 1 MG/0.5ML SOAJ; Inject 1 mg under the skin once a week for 28 days. Start after 4 weeks of 0.5 mg wegovy  -     phentermine (ADIPEX-P) 37.5 MG tablet; Take 1 tablet (37.5 mg) by mouth every morning (before breakfast).    Weight gain  -     topiramate (TOPAMAX) 25 MG tablet; Take 2 tablets (50 mg) by mouth daily. Every evening    Snoring    Other orders  -     CURES Review Documentation - I Reviewed CURES    37 yo lady obesity class 2--> class 1--> class 2 BMI 36 with knee pain, migraine, ?OSA. Gained wt back despite taking phen/top. .     Plan  - diet counseling  - regular exercise  - will increase topamax to 50 mg a day  - start wegovy if insurance covering and med available. Otherwise restart phentermine at higher dose 37.5 mg  - sleep referral  - RTC 6 months

## 2022-05-21 ENCOUNTER — Other Ambulatory Visit (INDEPENDENT_AMBULATORY_CARE_PROVIDER_SITE_OTHER): Payer: Self-pay | Admitting: Endocrinology

## 2022-05-21 DIAGNOSIS — E6609 Other obesity due to excess calories: Secondary | ICD-10-CM

## 2022-05-21 NOTE — Telephone Encounter (Signed)
[facility]     Wegovy (semaglutide) is not currently included in the Pharmacy Refill Clinic protocols. Re-routing to the responsible staff for processing. Thank you!

## 2022-05-23 NOTE — Telephone Encounter (Signed)
Routing message to provider for review and approval  Of med refill    Last Visit: 05/20/2022  Next Visit: Visit date not found    Medication Requested: wegovy    Chart Review  Verify no refills available  Verify Last Office Visit/Encounter for updated dose  Verify Last Prescribing Provider  If our records indicate that refills are still available, check Class and E-Prescribing Status, and/or call Pharmacy to verify    '[x]'$  Seen within 1 year or recommended time frame - OK to pend previous refill amount  '[]'$  Seen >1 year ago - OK 30 day fill ONLY, no refills - Contact Patient before sending to provider and advise to follow up    Before Routing  Ensure no hard stops or warnings prior to routing to provider  Ensure encounter is routed as Rx Request vs Patient Call/Message

## 2022-05-25 ENCOUNTER — Encounter (INDEPENDENT_AMBULATORY_CARE_PROVIDER_SITE_OTHER): Payer: Self-pay | Admitting: Hospital

## 2022-05-25 MED ORDER — WEGOVY 1 MG/0.5ML SC SOAJ
SUBCUTANEOUS | 0 refills | Status: DC
Start: 2022-05-25 — End: 2022-10-16

## 2022-06-16 ENCOUNTER — Other Ambulatory Visit (INDEPENDENT_AMBULATORY_CARE_PROVIDER_SITE_OTHER): Payer: Self-pay | Admitting: Nurse Practitioner

## 2022-06-16 DIAGNOSIS — L299 Pruritus, unspecified: Secondary | ICD-10-CM

## 2022-06-16 NOTE — Telephone Encounter (Signed)
[facility]     Ketoconazole is not currently included in the Pharmacy Refill Clinic protocols. Re-routing to the responsible staff for processing.  Thank you

## 2022-06-18 MED ORDER — KETOCONAZOLE 2 % EX SHAM
1.00 | MEDICATED_SHAMPOO | Freq: Once | CUTANEOUS | 0 refills | Status: AC
Start: 2022-06-18 — End: 2022-06-18

## 2022-06-18 NOTE — Telephone Encounter (Signed)
Established with:  Last OV with PCP:  Next OV with Dept:  Future Appointments: Truddie Hidden   Visit date not found  Visit date not found  No future appointments.     Medication requested:   Requested Prescriptions     Pending Prescriptions Disp Refills    ketoconazole (NIZORAL) 2 % shampoo [Pharmacy Med Name: KETOCONAZOLE 2% SHAMPOO] 120 mL 0     Sig: APPLY 1 APPLICATION. TOPICALLY ONCE FOR 1 DOSE.       Last Filled Date 02/27/2022   Quantity Last Filled     Refill    Send to:    [id number] - [address], [address]  [place] [address]  Phone: [phone] Fax: [phone]    [id number] - [place], [place]  [place]  [place]  [address]  Phone: [phone] Fax: [phone]      Current Medication(s):  Current Outpatient Medications   Medication Sig Dispense Refill    calcipotriene (DOVONEX) 0.005 % ointment Apply topically to affected area twice a day on the weekends to affected areas on the elbows and knees 60 g 5    clobetasol propionate (TMOVATE) 0.05 % solution Use a small amount as directed once or twice per week to the scalp to prevent flares. When flaring, using once daily M-F, taking a break on the weekends. 50 mL 11    cyclobenzaprine (FLEXERIL) 10 MG tablet Take 1 tablet (10 mg) by mouth every 8 hours as needed for Muscle Spasms. 15 tablet 0    escitalopram (LEXAPRO) 10 MG tablet Take 1 tablet (10 mg) by mouth daily. 90 tablet 1    ibuprofen (MOTRIN) 600 MG tablet Take 1 tablet (600 mg) by mouth every 6 hours as needed for Mild Pain (Pain Score 1-3) or Moderate Pain (Pain Score 4-6). 30 tablet 0    phentermine (ADIPEX-P) 37.5 MG tablet Take 1 tablet (37.5 mg) by mouth every morning (before breakfast). 30 tablet 2    Semaglutide-Weight Management (WEGOVY) 1 MG/0.5ML SOAJ INJECT 1 MG UNDER THE SKIN ONCE A WEEK FOR 28 DAYS. START AFTER 4 WEEKS OF 0.5 MG WEGOVY 2 mL 0    Semaglutide-Weight Management 0.25 MG/0.5ML SOAJ Inject 0.25 mg under  the skin once a week for 28 days. 2 mL 0    Semaglutide-Weight Management 0.5 MG/0.5ML SOAJ Inject 0.5 mg under the skin once a week for 28 days. Start after 4 weeks of 0.25 mg wegovy 2 mL 0    topiramate (TOPAMAX) 25 MG tablet Take 2 tablets (50 mg) by mouth daily. Every evening 180 tablet 2    triamcinolone (KENALOG) 0.1 % cream Apply 1 Application. topically 2 times daily. Apply a thin layer as directed 45 g 0     No current facility-administered medications for this visit.       Patient information: Allergies   Allergen Reactions    Penicillins Hives     Other reaction(s): Hives        Health Maintenance Due   Topic Date Due    Tetanus (5 - Tdap) 04/22/2006    HPV Vaccine <= 26 Yrs (2 - 3-dose series) 06/02/2010    Influenza (1) Never done    COVID-19 Vaccine (4 - 2023-24 season) 11/28/2021      Last labs:  Lab Results   Component Value Date    CHOL 159 08/30/2020    HDL 45 08/30/2020  LDLCALC 83 08/30/2020    TRIG 156 08/30/2020    TSH 1.13 08/20/2020    A1C 5.6 08/20/2020      Blood Pressure   09/17/21 109/75   05/15/21 119/83   04/08/21 122/78    No components found for: "83M"

## 2022-09-17 ENCOUNTER — Telehealth (INDEPENDENT_AMBULATORY_CARE_PROVIDER_SITE_OTHER): Payer: Self-pay | Admitting: Family Medicine

## 2022-09-17 NOTE — Telephone Encounter (Signed)
-----   Message from Jeronimo Norma sent at 09/17/2022 11:34 AM PDT -----  Regarding: FW: Appointment scheduled from MyChart  Contact: [phone]    ----- Message -----  From: Arelia Sneddon "Les"  Sent: 09/15/2022   1:00 PM PDT  To: [facility] Front Desk  Subject: Appointment scheduled from MyChart               Appointment For: Arelia Sneddon ([id number])  Visit Type: RETURN PRIMARY CARE PATIENT ([id number])    09/22/2022   10:20 AM  20 mins.  Phoebe Perch     [facility]    Patient Comments:  Peggye Form had a cough for more than 8 weeks with chest pain and had to breath .

## 2022-09-17 NOTE — Telephone Encounter (Signed)
I have attempted to contact this patient by phone with the following results: called patient, no answer, left voicemail to return call to clinic, phone number included..  Mychart message also sent to pt.     CALL CENTER:  Any RN can take the call  Please transfer to red flag line as message includes red flag symptoms

## 2022-09-18 ENCOUNTER — Telehealth (INDEPENDENT_AMBULATORY_CARE_PROVIDER_SITE_OTHER): Payer: Self-pay

## 2022-09-18 NOTE — Telephone Encounter (Signed)
Second MyChart message sent to pt.  Closing this triage encounter, unable to reach patient via telephone and MyChart messaging.

## 2022-09-18 NOTE — Telephone Encounter (Signed)
9:55 am- I attempted to reach patient at listed numbers to advise patient to contact [facility] Triage RN at [phone]. Mobile number is out of service. Left messages on home and work numbers.         Marisa Cyphers. Genevive Bi, BSN, RN, AMB-BC, [facility]  Resource RN  [facility]   [facility]

## 2022-09-21 ENCOUNTER — Encounter (INDEPENDENT_AMBULATORY_CARE_PROVIDER_SITE_OTHER): Payer: Self-pay | Admitting: Family Medicine

## 2022-09-21 NOTE — Progress Notes (Signed)
Ruth Hall is a 37 year old female with G1P1001     Patient Active Problem List   Diagnosis    ASCUS (atypical squamous cells of undetermined significance) on Pap smear    BMI 35.0-35.9,adult    Anxiety       Dry cough  Voice went away x 3 months  Coughs up clear mucus  Never had a cold  No history of asthma  No SOB, no fever    I reviewed the patient's medical record, including PMH, SH, medications, allergies, and ROS.    Vitals:    09/22/22 1027   BP: 126/90   BP Location: Left arm   BP Patient Position: Sitting   BP cuff size: Regular   Pulse: 110   Resp: 17   Temp: 97.2 F (36.2 C)   TempSrc: Temporal   SpO2: 96%   Weight: 76.4 kg (168 lb 6.4 oz)   Height: 5' (1.524 m)     Physical Exam:   General Appearance: healthy, alert, no distress, pleasant affect, cooperative.  Eyes:  conjunctivae and corneas clear. PERRL, EOM's intact. Fundi benign.  Ears:  normal TMs and canal.  Nose:  normal   Mouth: normal.  Neck:  Neck supple. No adenopathy, thyroid symmetric, normal size.  Heart:  normal rate and regular rhythm, no murmurs, clicks, or gallops.  Lungs: clear to auscultation and percussion, no chest deformities noted.    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    Diagnoses and all orders for this visit:    Cough, unspecified type  -     X-Ray Chest Frontal And Lateral; Future  -     Upper Respiratory Pathogen Nucleic Acid Detection Test Viral Transport Media  -     predniSONE (DELTASONE) 20 MG tablet; Take 1 tablet (20 mg) by mouth daily.  -     doxycycline (MONODOX) 100 MG capsule; Take 1 capsule (100 mg) by mouth 2 times daily.  -     omeprazole (PRILOSEC) 20 MG capsule; Take 1 capsule (20 mg) by mouth every morning (before breakfast).  -     albuterol 108 (90 Base) MCG/ACT inhaler; Inhale 2 puffs by mouth every 6 hours as needed (cough, congestion, and/or wheezing. Use with spacer).  -     spacer (AEROCHAMBER) MISC; Use as directed    Need for vaccination  -     Cancel: HPV 9-Valent  (GARDASIL-9) Vaccine  -     Cancel: Tdap (BOOSTRIX or Adacel) for patients 29-83 years old  -     Cancel: COVID-19 [facility]) Vaccine 12+ Years IM, 2023-24    GAD (generalized anxiety disorder)  -     Cancel: [place] PHQ9 FLOWSHEET

## 2022-09-22 ENCOUNTER — Encounter (INDEPENDENT_AMBULATORY_CARE_PROVIDER_SITE_OTHER): Payer: Self-pay

## 2022-09-22 ENCOUNTER — Encounter (INDEPENDENT_AMBULATORY_CARE_PROVIDER_SITE_OTHER): Payer: Self-pay | Admitting: Hospital

## 2022-09-22 ENCOUNTER — Other Ambulatory Visit: Payer: HMO | Attending: Family Medicine | Admitting: Family Medicine

## 2022-09-22 ENCOUNTER — Inpatient Hospital Stay (INDEPENDENT_AMBULATORY_CARE_PROVIDER_SITE_OTHER)
Admit: 2022-09-22 | Discharge: 2022-09-22 | Disposition: A | Discharge status: Home Health | Payer: HMO | Attending: Family Medicine | Admitting: Family Medicine

## 2022-09-22 VITALS — BP 126/90 | HR 110 | Temp 97.2°F | Resp 17 | Ht 60.0 in | Wt 168.4 lb

## 2022-09-22 DIAGNOSIS — F411 Generalized anxiety disorder: Secondary | ICD-10-CM

## 2022-09-22 DIAGNOSIS — R059 Cough, unspecified: Secondary | ICD-10-CM

## 2022-09-22 DIAGNOSIS — Z23 Encounter for immunization: Secondary | ICD-10-CM

## 2022-09-22 LAB — UPPER RESPIRATORY PATHOGEN NUCLEIC ACID DETECTION TEST
Adenovirus PCR, Nasopharyngeal: NOT DETECTED
Bordetella Parapertussis PCR, Nasopharyngeal: NOT DETECTED
Bordetella Pertussis PCR, Nasopharyngeal: NOT DETECTED
Chlamydia pneumoniae PCR, Nasopharyngeal: NOT DETECTED
Coronavirus 229E PCR, Nasopharyngeal: NOT DETECTED
Coronavirus HKU1 PCR, Nasopharyngeal: NOT DETECTED
Coronavirus NL63 PCR, Nasopharyngeal: NOT DETECTED
Coronavirus OC43 PCR, Nasopharyngeal: NOT DETECTED
Influenza A PCR, Nasopharyngeal: NOT DETECTED
Influenza B PCR, Nasopharyngeal: NOT DETECTED
Metapneumovirus PCR, Nasopharyngeal: NOT DETECTED
Mycoplasma pneumoniae PCR, Nasopharyngeal: NOT DETECTED
Parainfluenza 1 PCR, Nasopharyngeal: NOT DETECTED
Parainfluenza 2 PCR, Nasopharyngeal: NOT DETECTED
Parainfluenza 3 PCR, Nasopharyngeal: NOT DETECTED
Parainfluenza 4 PCR, Nasopharyngeal: NOT DETECTED
Respiratory Syncytial Virus (RSV) PCR, Nasopharyngeal: NOT DETECTED
Rhinovirus/Enterovirus PCR, Nasopharyngeal: NOT DETECTED
SARS-CoV-2 (COVID-19 PCR, Nasopharyngeal: NOT DETECTED

## 2022-09-22 MED ORDER — OMEPRAZOLE 20 MG OR CPDR
20.0000 mg | DELAYED_RELEASE_CAPSULE | Freq: Every day | ORAL | 2 refills | Status: AC
Start: 2022-09-22 — End: ?

## 2022-09-22 MED ORDER — ALBUTEROL SULFATE 108 (90 BASE) MCG/ACT IN AERS
2.0000 | INHALATION_SPRAY | Freq: Four times a day (QID) | RESPIRATORY_TRACT | 1 refills | Status: DC | PRN
Start: 2022-09-22 — End: 2022-10-16

## 2022-09-22 MED ORDER — PREDNISONE 20 MG OR TABS
20.0000 mg | ORAL_TABLET | Freq: Every day | ORAL | 0 refills | Status: DC
Start: 2022-09-22 — End: 2022-10-16

## 2022-09-22 MED ORDER — SPACER/AERO CHAMBER MOUTHPIECE MISC (CUSTOM)
0 refills | Status: AC
Start: 2022-09-22 — End: ?

## 2022-09-22 MED ORDER — DOXYCYCLINE MONOHYDRATE 100 MG OR CAPS
100.0000 mg | ORAL_CAPSULE | Freq: Two times a day (BID) | ORAL | 0 refills | Status: DC
Start: 2022-09-22 — End: 2022-10-26

## 2022-09-22 NOTE — Telephone Encounter (Signed)
Pt states she has had these sxs for 3 months. Covid was negative. Clear mucous. States no new or worsening sxs. Pt has an appt today.    Future Appointments   Date Time Provider Department Center   09/22/2022 10:20 AM Phoebe Perch, MD [facility]

## 2022-09-22 NOTE — Patient Instructions (Signed)
Let me know if cough or hoarseness of voice persist or worsen.  Take all medication as prescribed.

## 2022-09-23 ENCOUNTER — Other Ambulatory Visit (INDEPENDENT_AMBULATORY_CARE_PROVIDER_SITE_OTHER): Payer: Self-pay | Admitting: Family Medicine

## 2022-09-23 DIAGNOSIS — F418 Other specified anxiety disorders: Secondary | ICD-10-CM

## 2022-09-23 NOTE — Telephone Encounter (Signed)
Scot Jun Basnett, CPhT  (Rx Refill and PA Clinic)      Serotonin Reuptake Inhibitor Refill Protocol (or Trazodone or Buspar)    Last visit in enc specialty: 09/22/2022 UNSIGNED     Recent Visits in This Encounter Department       Date Provider Department Visit Type Primary Dx    09/22/2022 Phoebe Perch, MD [facility] Office Visit Need for vaccination    09/17/2021 Phoebe Perch, MD [facility] Office Visit Left-sided low back pain without sciatica, unspecified chronicity    06/13/2021 Roni Bread, PSYD [facility] Home Health Telemedicine Generalized anxiety disorder    06/06/2021 Roni Bread, PSYD [facility] Home Health Telemedicine Generalized anxiety disorder    05/30/2021 Roni Bread, PSYD [facility] Home Health Telemedicine Generalized anxiety disorder           Population Health Visits  Recent [facility] Visits    None       Next f/u appt due:  Return if symptoms worsen or fail to improve.      Next appt in enc specialty: Visit date not found      Future Appointments 09/23/2022 - 09/22/2027      None                LABS required:  (Q yr SCr (paxil only; lexapro removed creat req on 12/25/19))    Lab Results   Component Value Date    NA 142 08/20/2020    K 4.6 08/20/2020    CL 106 08/20/2020    BICARB 25 08/20/2020    BUN 12 08/20/2020    CREAT 0.82 08/20/2020       Lab Results   Component Value Date    GFRNON >60 08/20/2020    GFRAA >60 03/12/2006    EGFRCKDEPI >60 08/20/2020         Monitoring required:  (Q year BP, HR)   *EKG/QTc at appropriate interval for irregular HR in pts w CVD and/or risk factors assoc w  QTc prolongation (High Dose/DDi/Age etc)*    (BP range: Systolic=90-150  TFTDDUKGU=54-27)  Blood Pressure   09/22/22 126/90   09/17/21 109/75   05/15/21 119/83       (HR range: 55-110)  Pulse Readings from Last 3 Encounters:   09/22/22 110   09/17/21 104   05/15/21 89         Last  Digital Health Monitoring Vitals:        Last MyChart BP Values:        Last Pt Entered MyChart BP Values:

## 2022-09-25 MED ORDER — ESCITALOPRAM OXALATE 10 MG OR TABS
10.00 mg | ORAL_TABLET | Freq: Every day | ORAL | 3 refills | Status: AC
Start: 2022-09-25 — End: ?

## 2022-09-26 ENCOUNTER — Encounter (INDEPENDENT_AMBULATORY_CARE_PROVIDER_SITE_OTHER): Payer: Self-pay | Admitting: Family Medicine

## 2022-09-29 ENCOUNTER — Encounter (INDEPENDENT_AMBULATORY_CARE_PROVIDER_SITE_OTHER): Payer: Self-pay | Admitting: Family Medicine

## 2022-09-29 ENCOUNTER — Encounter (INDEPENDENT_AMBULATORY_CARE_PROVIDER_SITE_OTHER): Payer: Self-pay

## 2022-09-29 DIAGNOSIS — R053 Chronic cough: Secondary | ICD-10-CM

## 2022-09-30 NOTE — Telephone Encounter (Signed)
Left message to patient vm to call clinic or through mychart to schedule a video visit. Mychart message sent.

## 2022-10-08 ENCOUNTER — Telehealth (HOSPITAL_COMMUNITY): Payer: Self-pay

## 2022-10-08 ENCOUNTER — Encounter (INDEPENDENT_AMBULATORY_CARE_PROVIDER_SITE_OTHER): Payer: Self-pay

## 2022-10-08 ENCOUNTER — Encounter (INDEPENDENT_AMBULATORY_CARE_PROVIDER_SITE_OTHER): Payer: HMO | Admitting: Family Medicine

## 2022-10-08 ENCOUNTER — Encounter (HOSPITAL_COMMUNITY): Payer: Self-pay | Admitting: Hospital

## 2022-10-08 DIAGNOSIS — R053 Chronic cough: Secondary | ICD-10-CM

## 2022-10-08 NOTE — Telephone Encounter (Signed)
1st, 2nd and 3rd attempt made to contact patient; Called patient, no response. Left message re: PFT referral and scheduling, phone number [phone] provided for return call to schedule appointment, mychart message sent and e-mail sent.

## 2022-10-08 NOTE — Progress Notes (Signed)
Patient seen Status: Patient was evaluated via telemedicine (audio or audio/video)    Ruth Hall is a 37 year old female with G1P1001 who presents for follow up.  Cough going on x 4 months.  Pt seen 09/22/22 --> prescription for prednisone burst, but never picked up medication from pharmacy.  Also prescribed doxycycline and omeprazole, has not taken these.  Cough productive.  Covid test negative four days ago, though does feel she has lost sense of taste.    I reviewed the patient's medical record, including PMH, SH, medications, allergies, and ROS.    Patient Active Problem List   Diagnosis    ASCUS (atypical squamous cells of undetermined significance) on Pap smear    BMI 35.0-35.9,adult    Anxiety     Physical Exam: General Appearance: healthy, alert, no distress, pleasant affect, cooperative. 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    Diagnoses and all orders for this visit:    Chronic cough  Encouraged patient to pick up meds (prednisone,doxycycline, omeprazole). If no improvement aftermeds, plan PFTs and Pulmonary consult.  -     Complete PFT; Future  -     Consult/Referral to Pulmonary and Sleep; Future            ---------------------(data below generated by Phoebe Perch, MD)--------------------     Patient Verification & Telemedicine Consent & Financial Waiver:    1.   Identity: I have verified this patient's identity to be accurate.  2.   Consent: I verify consent has been secured in one of the following methods: (a) obtained written/ online attestation consent (via MyChartVideoVisit pathway), (b) the spoke-side provider has obtained verbal or written consent from patient/surrogate (if this is a "provider to provider" evaluation), or (c) in all other cases, I have personally obtained verbal consent from the patient/ surrogate (noting all elements below) to perform this voluntary telemedicine evaluation (including obtaining history,  performing examination and reviewing data provided by the patient).   The patient/ surrogate has the right to refuse this evaluation.  I have explained risks (including potential loss of confidentiality), benefits, alternatives, and the potential need for subsequent face to face care. Patient/ surrogate understands that there is a risk of medical inaccuracies given that our recommendations will be made based on reported data (and we must therefore assume this information is accurate).  Knowing that there is a risk that this information is not reported accurately, and that the telemedicine video, audio, or data feed may be incomplete, the patient agrees to proceed with evaluation and holds [place] harmless knowing these risks.  3.   Healthcare Team: The patient/ surrogate has been notified that other healthcare professionals (including students, residents and Engineer, maintenance) may be involved in this audio-video evaluation.   All laws concerning confidentiality and patient access to medical records and copies of medical records apply to telemedicine.  4.   Privacy: If this is a Restaurant manager, fast food Visit, the patient/ surrogate has received the [place] Notice of Privacy Practices via E-Checkin process.  For all other video visit techniques, I have verbally provided the patient/ surrogate with the [place] web link in [place] (https://health.[email or web address].aspx) or Spanish ([email or web address]://[email or web address].aspx).  The patient/ surrogate acknowledges both being provided the NPP link, and has been offered to have the NPP mailed to the patient/ surrogate by [place] mail.  The patient/ surrogate has voiced understanding an acknowledgement of receipt of this NPP web address.  If the  patient/surrogate has elected to receive the NPP via [place] mail, I verify that the NPP will be sent promptly to the patient/surrogate via [place] mail.  5.   Capacity: I have reviewed this above verification and consent paragraph with the patient/  surrogate and the patient is capacitated or has a surrogate. If the patient is not capacitated to understand the above, and no surrogate is available, since this is not an emergency evaluation, the visit will be rescheduled until such time that the patient can consent, or the surrogate is available to consent. If this is an emergency evaluation and the patient is not capacitated to understand the above, and no surrogate is available, I am proceeding with this evaluation as this is felt to be an emergency setting and no appropriate specialist is available at the bedside to perform these evaluations.  6.   Financial Waiver: If this is a Restaurant manager, fast food Visit, the patient has been made aware of the financial waiver via E-Checkin process.  For all other video visit techniques, an E-Checkin process is not performed.  As such, I have personally verbally informed the patient/ surrogate that this evaluation will be a billable encounter similar to an in-person clinic visit, and the patient/ surrogate has agreed to pay the fee for services rendered.  If we are billing insurance for the patient's telehealth visit, her out-of-pocket cost will be determined based on her plan and will be billed to her.  The patient/ surrogate has also been informed that if the patient does not have insurance or does not wish to use insurance, [facility] [address] [employer] price for a primary care telehealth visit is $59.00 and specialist telehealth visit is $88.00.  I have further informed the patient/ surrogate that in the event the patient has additional services provided in conjunction with the specialty visit (Ex. Psychotherapy services), those services will be billed at the current rate less a 45% discount.  7.   Intra-State Location: The patient/ surrogate attests to understanding that if the patient accesses these services from a location outside of [place], that the patient does so at the patient's own risk and initiative and that the  patient is ultimately responsible for compliance with any laws or regulations associated with the patient's use.  8.   Specific Use:The patient/ surrogate understands that [facility] makes no representation that materials or servicesdelivered via telecommunication services, or listed on telemedicine websites, are appropriate or available for use in any other location.           Demographics:  Medical Record #: [id number]  Date: October 08, 2022  Patient Name: Antasia Deschamps  DOB: [birth year]  Age: 37 year old  Sex: female  Location: Home address on file  Patient seen Status: Patient was evaluated via telemedicine (audio or audio/video)     Evaluator(s):  Elexa Latzke was evaluated by me today.    Clinic Location: [facility]  [facility]  [address]  [place] [address]     PLEASE DELETE THIS:  Please enter LOS [phone], [phone], [phone] (based on MDM performed/documented), or enter total time in the Calculate LOS Based on Time section of the LOS (based on the total time spent/documented on the date of the encounter) and Epic will code the LOS for you. Remember to also and add the GT modifier to the LOS.     If you convert the video visit to a telephone visit due to technical issues, you  should use the following telephone codes instead: 99441 5-10 minutes, 99442, 11-20 minutes, and 16109, 21-30 minutes. You should also add a remark about this in your note.

## 2022-10-13 ENCOUNTER — Telehealth (INDEPENDENT_AMBULATORY_CARE_PROVIDER_SITE_OTHER): Payer: Self-pay | Admitting: Endocrinology

## 2022-10-13 ENCOUNTER — Encounter (INDEPENDENT_AMBULATORY_CARE_PROVIDER_SITE_OTHER): Payer: Self-pay | Admitting: Endocrinology

## 2022-10-13 DIAGNOSIS — E669 Obesity, unspecified: Secondary | ICD-10-CM

## 2022-10-13 DIAGNOSIS — E6609 Other obesity due to excess calories: Secondary | ICD-10-CM

## 2022-10-13 DIAGNOSIS — R635 Abnormal weight gain: Secondary | ICD-10-CM

## 2022-10-13 NOTE — Telephone Encounter (Addendum)
Prescription Refill Request     Who is requesting the refill? pt    Prescribing provider: Dr. Bubba Hales    Last office visit: 05-20-2022  Next office visit: 04-11-2022    Requested Medication/s:     topiramate (TOPAMAX) 25 MG tablet     phentermine (ADIPEX-P) 37.5 MG tablet       Pharmacy Requested:      [id number] [place] [facility], [place] - [address]  [address]  [place] [address]  Phone: [phone] Fax: [phone]      Is patient out of medication? 4 left of the phentermine    If yes when did patient run out of medication?      Best call back ph? [phone]    Has Patient been advised this message will be transmitted to office and if any issues can expect a response within the next 72 hours? yes

## 2022-10-15 ENCOUNTER — Ambulatory Visit (INDEPENDENT_AMBULATORY_CARE_PROVIDER_SITE_OTHER): Payer: Self-pay | Admitting: Family Medicine

## 2022-10-15 NOTE — Telephone Encounter (Signed)
PROVIDER/CLINIC ACTION REQUESTED: No, FYI only   Action item needed:  none  Appt scheduled: Yes  Future Appointments   Date Time Provider Department Center   10/16/2022  8:40 AM Talitha Givens, MD, MPH [facility]   10/27/2022  3:00 PM [id number] Pft [facility]   04/12/2023 10:40 AM Tantisira, Earlie Counts, MD [facility] [place] [facility]     Disposition: See PCP Within 24 Hours    COVID Testing Status: n/a   COVID TRIAGE PROTOCOL: n/a     Chief Complaint  vomiting x 1 week  Assessment details:  Patient endorses vomiting for 1-1.5 weeks. She endorses that last night she had some blood when she vomited and it was mixed with her mucous and then again this morning. Patient states it is less than streaks of blood. Patient denies weakness, dizziness, but does endorse a cough for 2 months and she was started on antibiotics for 1 week now. Patient offered appt for today but declined and wanted to be seen tomorrow    Intervention: Advice given, Acute appt scheduled, and pt given strict [place]/ED precautions and reviewed all applicable Home Care Advice per protocol.     COVID VACCINATION STATUS:   Current Covid Vaccinations   Administered Date(s) Administered    COVID-19 Proofreader) Wallace Cullens Cap >= 12 Years 04/26/2020    COVID-19 [employer]) Purple Cap >= 12 Years 05/19/2019, 06/09/2019         Reason for Call: Vomiting         Reason for Disposition   Vomiting (without blood) persists > 48 hours    Additional Information   Negative: Weak, dizzy or lightheaded   Negative: [1] Vomited blood AND [2] more than a few streaks of blood  (Exceptions: Few streaks that occurred only once, or swallowed blood from a nosebleed or cut in the mouth.)   Negative: [1] Vomiting AND [2] contains black ("coffee ground") material   Negative: Black or tarry bowel movements   Negative: [1] Constant abdominal pain AND [2] present > 2 hours   Negative: Recent abdominal injury (within last 3 days)   Negative: Jaundice (yellow eyes) or known cirrhosis of the  liver (or history of liver failure or ascites)   Negative: Taking Coumadin (warfarin) or other strong blood thinner, or known bleeding disorder (e.g., thrombocytopenia)   Negative: Pale skin (pallor) of new-onset or worsening   Negative: [1] Drinking very little AND [2] dehydration suspected (e.g., no urine > 12 hours, very dry mouth, very lightheaded)   Negative: Age > 60 years   Negative: High-risk adult (e.g., diabetes mellitus, brain tumor, V-P shunt, inguinal hernia, chemotherapy)   Negative: Unhealthy alcohol use, known or suspected   Negative: Patient sounds very sick or weak to the triager   Negative: [1] Vomiting AND [2] abdomen looks much more swollen than usual   Negative: Taking any of the following medications: digoxin (Lanoxin), lithium, theophylline, phenytoin (Dilantin)   Negative: Coughing up blood (i.e., from lungs)   Negative: Shock suspected (e.g., cold/pale/clammy skin, too weak to stand, low BP, rapid pulse)   Negative: Difficult to awaken or acting confused (e.g., disoriented, slurred speech)   Negative: Unable to walk, or can only walk with assistance (e.g., requires support)   Negative: Fainted   Negative: [1] Vomited blood AND [2] large amount (example: "a cup of blood")   Negative: Rectal bleeding (i.e., bloody stool, blood in stool)   Negative: Sounds like a life-threatening emergency to the triager  Answer Assessment - Initial Assessment Questions  Patient endorses vomiting for 1-1.5 weeks. She endorses that last night she had some blood when she vomited and it was mixed with her mucous and then again this morning. Patient states it is less than streaks of blood. Patient denies weakness, dizziness, but does endorse a cough for 2 months and she was started on antibiotics for 1 week now. Patient offered appt for today but declined and wanted to be seen tomorrow    Protocols used: Vomiting Blood-A-AH

## 2022-10-15 NOTE — Telephone Encounter (Signed)
Symptom Call         What symptom is the patient experiencing? Patient Is throwing up blood and is feeling very nauseas. She is taking Monodox medication.   Is this a new or ongoing symptom? new  When did the symptom(s) begin? Yesterday  Who is reporting the symptoms? Incoming call from patient    Insurance Coverage Verified: yes  Insurance Plan Name:  Yehuda Budd to [facility]: yes  Next office visit:  Visit date not found  Did you offer Express Care/Urgent Care: (if met criteria for Same Day/3 Day scheduling but declines to schedule) no    Transferred call to: Warm Symptoms Line (ext 11050)    Best way to contact patient: [phone] (mobile)   Alternative communication method: [phone] (mobile)       P

## 2022-10-16 ENCOUNTER — Ambulatory Visit (INDEPENDENT_AMBULATORY_CARE_PROVIDER_SITE_OTHER): Payer: HMO | Admitting: Public Health & General Preventive Medicine

## 2022-10-16 ENCOUNTER — Other Ambulatory Visit: Payer: HMO | Attending: Endocrinology

## 2022-10-16 VITALS — BP 113/80 | HR 89 | Temp 97.0°F | Resp 16 | Ht 60.0 in | Wt 164.0 lb

## 2022-10-16 DIAGNOSIS — R053 Chronic cough: Secondary | ICD-10-CM

## 2022-10-16 DIAGNOSIS — E6609 Other obesity due to excess calories: Secondary | ICD-10-CM | POA: Insufficient documentation

## 2022-10-16 DIAGNOSIS — R059 Cough, unspecified: Secondary | ICD-10-CM

## 2022-10-16 DIAGNOSIS — Z6835 Body mass index (BMI) 35.0-35.9, adult: Secondary | ICD-10-CM | POA: Insufficient documentation

## 2022-10-16 DIAGNOSIS — J45901 Unspecified asthma with (acute) exacerbation: Secondary | ICD-10-CM

## 2022-10-16 LAB — CBC WITH DIFF, BLOOD
ANC-Automated: 4 10*3/uL (ref 1.6–7.0)
Abs Basophils: 0 10*3/uL (ref <0.2)
Abs Eosinophils: 0 10*3/uL (ref 0.0–0.5)
Abs Lymphs: 2.2 10*3/uL (ref 0.8–3.1)
Abs Monos: 0.6 10*3/uL (ref 0.2–0.8)
Basophils: 0.3 %
Eosinophils: 0.3 %
Hct: 45.4 % — ABNORMAL HIGH (ref 34.0–45.0)
Hgb: 14.4 g/dL (ref 11.2–15.7)
Imm Gran %: 0.3 % (ref <1)
Lymphocytes: 32.3 %
MCH: 27 pg (ref 26.0–32.0)
MCHC: 31.7 g/dL — ABNORMAL LOW (ref 32.0–36.0)
MCV: 85 um3 (ref 79.0–95.0)
MPV: 11.3 fL (ref 9.4–12.4)
Monocytes: 8.2 %
Plt Count: 270 10*3/uL (ref 140–370)
RBC: 5.34 10*6/uL — ABNORMAL HIGH (ref 3.90–5.20)
RDW: 14.7 % — ABNORMAL HIGH (ref 12.0–14.0)
Segs: 58.6 %
WBC: 6.8 10*3/uL (ref 4.0–10.0)

## 2022-10-16 LAB — LIPID(CHOL FRACT) PANEL, BLOOD
Cholesterol: 151 mg/dL (ref <200)
HDL-Cholesterol: 56 mg/dL
LDL-Chol (Calc): 77 mg/dL (ref <160)
Non-HDL Cholesterol: 95 mg/dL
Triglycerides: 88 mg/dL (ref 10–170)

## 2022-10-16 LAB — COMPREHENSIVE METABOLIC PANEL, BLOOD
ALT (SGPT): 22 U/L (ref 0–33)
AST (SGOT): 14 U/L (ref 0–32)
Albumin: 4.4 g/dL (ref 3.5–5.2)
Alkaline Phos: 68 U/L (ref 40–130)
Anion Gap: 12 mmol/L (ref 7–15)
BUN: 16 mg/dL (ref 6–20)
Bicarbonate: 24 mmol/L (ref 22–29)
Bilirubin, Tot: 0.67 mg/dL (ref <1.2)
Calcium: 9.4 mg/dL (ref 8.5–10.6)
Chloride: 105 mmol/L (ref 98–107)
Creatinine: 0.87 mg/dL (ref 0.51–0.95)
Glucose: 87 mg/dL (ref 70–99)
Potassium: 4 mmol/L (ref 3.5–5.1)
Sodium: 141 mmol/L (ref 136–145)
Total Protein: 7.9 g/dL (ref 6.0–8.0)
eGFR Based on CKD-EPI 2021 Equation: >60 mL/min/{1.73_m2}

## 2022-10-16 LAB — TSH, BLOOD: TSH: 0.83 u[IU]/mL (ref 0.27–4.20)

## 2022-10-16 LAB — GLYCOSYLATED HGB(A1C), BLOOD: Glyco Hgb (A1C): 5.3 % (ref 4.8–5.8)

## 2022-10-16 LAB — VITAMIN D, 25-OH TOTAL: Vitamin D, 25-Hydroxy: 11 ng/mL — ABNORMAL LOW (ref 30–80)

## 2022-10-16 LAB — VITAMIN B12, BLOOD: Vitamin B12: 583 pg/mL (ref 232–1245)

## 2022-10-16 MED ORDER — PREDNISONE 20 MG OR TABS
40.0000 mg | ORAL_TABLET | Freq: Every day | ORAL | 0 refills | Status: DC
Start: 2022-10-16 — End: 2022-10-16

## 2022-10-16 MED ORDER — IPRATROPIUM BROMIDE HFA 17 MCG/ACT IN AERS
2.0000 | INHALATION_SPRAY | Freq: Four times a day (QID) | RESPIRATORY_TRACT | 11 refills | Status: DC
Start: 2022-10-16 — End: 2022-10-16

## 2022-10-16 MED ORDER — PREDNISONE 20 MG OR TABS
40.0000 mg | ORAL_TABLET | Freq: Every day | ORAL | 0 refills | Status: DC
Start: 2022-10-16 — End: 2022-10-26

## 2022-10-16 MED ORDER — BECLOMETHASONE DIPROP HFA 80 MCG/ACT IN AERB
1.0000 | INHALATION_SPRAY | Freq: Two times a day (BID) | RESPIRATORY_TRACT | 4 refills | Status: DC
Start: 2022-10-16 — End: 2022-10-16

## 2022-10-16 MED ORDER — BECLOMETHASONE DIPROP HFA 80 MCG/ACT IN AERB
1.0000 | INHALATION_SPRAY | Freq: Two times a day (BID) | RESPIRATORY_TRACT | 4 refills | Status: DC
Start: 2022-10-16 — End: 2022-10-26

## 2022-10-16 MED ORDER — ALBUTEROL SULFATE 108 (90 BASE) MCG/ACT IN AERS
2.0000 | INHALATION_SPRAY | Freq: Four times a day (QID) | RESPIRATORY_TRACT | 1 refills | Status: AC | PRN
Start: 2022-10-16 — End: ?

## 2022-10-16 MED ORDER — IPRATROPIUM BROMIDE HFA 17 MCG/ACT IN AERS
2.0000 | INHALATION_SPRAY | Freq: Four times a day (QID) | RESPIRATORY_TRACT | 11 refills | Status: DC
Start: 2022-10-16 — End: 2022-10-26

## 2022-10-16 MED ORDER — IPRATROPIUM-ALBUTEROL 0.5-2.5 (3) MG/3ML IN SOLN
3.0000 mL | Freq: Once | RESPIRATORY_TRACT | Status: AC
Start: 2022-10-16 — End: 2022-10-17

## 2022-10-16 NOTE — Progress Notes (Signed)
Chief Complaint: Nausea vomiting with streaks of blood in mucous x1 sweek.    Subjective:  HPI:    Ruth Hall is a(n) 37 year old female with a past medical history noted below who presents for recent vomiting with streaks of blood.    #Chronic cough  Started about 4 months ago with a dry cough. Cough has since become productive. Patient started prednisone burst along with doxycycline on 7/12 but only completed 6 days of treatment. Patient stopped medications as she attributed her nausea and vomiting symptoms to treatment. Patient reports similar experiences on prednisone in the past for her psoriasis. Patient reports while on treatment symptoms improved 80% but still coughing. Patient reports 1.5 month history of intentional weight loss, ~20 lbs. Patient also endorses night sweats but attributes it to the prednisone. Patient endorses waking up multiple times a night feeling short of breath occurring almost every day of the week.     Review of Systems - All others negative    Objective:    Allergies   Allergen Reactions    Penicillins Hives     Other reaction(s): Hives       Medical History:  Past Medical History:   Diagnosis Date    Blood transfusion declined because patient is Jehovah's Witness     Chalazion left lower eyelid 10/02/2020    Hordeolum of left eye 10/01/2020    HPV in female     Migraine     Obesity     Squamous blepharitis of both eyes 10/02/2020     Patient Active Problem List   Diagnosis    ASCUS (atypical squamous cells of undetermined significance) on Pap smear    BMI 35.0-35.9,adult    Anxiety       Social Hx:   Social History     Social History Narrative    2019 - Lives with mom, 14yo son, and boyfriend.    Cats, dogs, [place] pig, fish       PE:    Vitals:Blood pressure 113/80, pulse 89, temperature 97 F (36.1 C), temperature source Temporal, resp. rate 16, height 5' (1.524 m), weight 74.4 kg (164 lb), SpO2 99%, not currently breastfeeding.Body mass index is 32.03  kg/m.    Physical Exam  Constitutional:       Appearance: Normal appearance. She is normal weight.   HENT:      Head: Normocephalic and atraumatic.   Cardiovascular:      Rate and Rhythm: Normal rate and regular rhythm.      Pulses: Normal pulses.      Heart sounds: No murmur heard.     No friction rub. No gallop.   Pulmonary:      Effort: Pulmonary effort is normal.      Breath sounds: Wheezing present.      Comments: Bronchial breath sounds throughout. Improved after duoneb. Occasional wheezes in the R lower lung field  Skin:     General: Skin is warm.         Labs/Imaging:  X-Ray Chest Frontal And Lateral    Result Date: 09/22/2022  IMPRESSION: Subtle flattening of the hemidiaphragms and prominent increased AP dimension in the lateral film is suggestive of air trapping/COPD potentially       Assessment/Plan:  Ruth Hall is a 37 year old female who is presenting to clinic with a 4 month chronic cough due to a new diagnosis of moderate asthma.    # Moderate Asthma with Acute Exacerbation  #Chronic  Cough  Symptoms of cough and SOB improved after administration of duoneb in clinic today. Patient has PFT scheduled next week, can confirm diagnosis. Patient brought in albuterol inhaler but noted not working in clinic. Given response to duoneb likely new diagnosis of asthma. However, given weight loss and night sweats will obtain quantTB test to r/o TB. Patient endorses prior history of positive PPD as a child but does not recall if she's been treated and subsequent PPD was negative. Restart prednisone burst, with maintenance with Albuterol/ipratropium, and ICS.   - F/u Quant TB  - Prednisone 40 mg burst for 5 days  - Ipratropium 2 puffs QID  - Albuterol PRN  - Beclomethasone 2 puffs q6hs    RTC: 1 week to assess symptoms.     Note written by Rainey Pines MS3.

## 2022-10-16 NOTE — Patient Instructions (Addendum)
Example of how to use your medications:   - Take Omeprazole (Prilosec) in the morning when you wake up.   - Take 1 puff of your beclomethasone inhaler before you brush your teeth, make sure to gargle the back of your throat.   - 30 Min later take your Prednisone 40 mg with food.   - Take 2 puffs of your ipratropium inhaler 4 times a day spaced however you need.   - Take 2 puffs of your albuterol inhaler as needed (e.g. feeling short of breath at the gym)     Message [place] in 3 days to let us know how your symptoms are.     Return to clinic in 1 week to touch base on your symptoms as well.

## 2022-10-16 NOTE — Interdisciplinary (Signed)
Blood drawn from right arm with 21 gauge needle. 8 tubes taken.   Patient identity authenticated by [name].

## 2022-10-17 ENCOUNTER — Other Ambulatory Visit (INDEPENDENT_AMBULATORY_CARE_PROVIDER_SITE_OTHER): Payer: Self-pay | Admitting: Endocrinology

## 2022-10-17 DIAGNOSIS — R7989 Other specified abnormal findings of blood chemistry: Secondary | ICD-10-CM

## 2022-10-17 MED ORDER — VITAMIN D (ERGOCALCIFEROL) 1.25 MG (50000 UT) PO CAPS
1.0000 | ORAL_CAPSULE | ORAL | 2 refills | Status: DC
Start: 2022-10-19 — End: 2022-11-18

## 2022-10-19 ENCOUNTER — Encounter (INDEPENDENT_AMBULATORY_CARE_PROVIDER_SITE_OTHER): Payer: Self-pay

## 2022-10-19 ENCOUNTER — Telehealth (INDEPENDENT_AMBULATORY_CARE_PROVIDER_SITE_OTHER): Payer: Self-pay | Admitting: Family Medicine

## 2022-10-19 DIAGNOSIS — R7612 Nonspecific reaction to cell mediated immunity measurement of gamma interferon antigen response without active tuberculosis: Secondary | ICD-10-CM

## 2022-10-19 DIAGNOSIS — R042 Hemoptysis: Secondary | ICD-10-CM

## 2022-10-19 DIAGNOSIS — R053 Chronic cough: Secondary | ICD-10-CM

## 2022-10-19 LAB — QUANTIFERON-TB, BLOOD
Result: POSITIVE — AB
TB1 Minus Nil: 3.9 [IU]/mL
TB2 minus Nil: 4.63 [IU]/mL

## 2022-10-19 NOTE — Telephone Encounter (Signed)
GENERAL INQUIRY:    Caller Information:  Who is calling? Patient  Best way to contact: Phone: [phone]  What is the reason for the call? Pt calling to advise she received TB lab result on my chart as Positive. Pt is concerned however her cough has improved  Requests call back today   Encounter Details:  Is this a duplicate encounter?: No previous documentation found on this issue.  Action required by the office: Please contact caller  Patient Information:  Is MyChart active?: Yes.  Verification:  Has the inquiry been read verbatim to the caller and verbalizes satisfaction and confirms the above is accurate?: Yes  Caller has been advised this message will be transmitted to the office and can expect a response within the next 24-72 hours during working business days.  Encounter created by Mortimer Fries from the Care Assist Team.  Signature generated from controlled access password on October 19, 2022 at 4:40 PM.

## 2022-10-19 NOTE — Telephone Encounter (Signed)
Patient phoned regarding TB results.  Patient taking prednisone - just started yesterday. Coughing has improved dramatically.  Only cough 1-2 times today. Throws up due to coughing and has post tussive emesis.     No further hemoptysis but had last week. Has had some recent weight loss.    Given positive and setting of chronic cough, indeterminate xray findings, positive qft, and hemoptysis, will refer to ID for urgent evaluation to r/O active tb.     Reached patient with two identifiers and provided information for scheduling and reviewed results.

## 2022-10-21 ENCOUNTER — Encounter (HOSPITAL_BASED_OUTPATIENT_CLINIC_OR_DEPARTMENT_OTHER): Payer: Self-pay | Admitting: Hospital

## 2022-10-22 ENCOUNTER — Encounter (INDEPENDENT_AMBULATORY_CARE_PROVIDER_SITE_OTHER): Payer: Self-pay | Admitting: Family Medicine

## 2022-10-22 ENCOUNTER — Telehealth (INDEPENDENT_AMBULATORY_CARE_PROVIDER_SITE_OTHER): Payer: Self-pay | Admitting: Family Medicine

## 2022-10-22 NOTE — Telephone Encounter (Signed)
Spoke with patient by phone regarding + Quantiferon. She said that she has tested positive since at least 2005 (during pregnanc) and possibly earlier. She has never been prescribed prophylactic medication.    Has upcoming appt next week, will discuss options for management at that time.

## 2022-10-25 ENCOUNTER — Encounter (INDEPENDENT_AMBULATORY_CARE_PROVIDER_SITE_OTHER): Payer: Self-pay

## 2022-10-25 NOTE — Progress Notes (Deleted)
Ruth Hall is a 37yo G1P1001     Patient Active Problem List   Diagnosis    ASCUS (atypical squamous cells of undetermined significance) on Pap smear    BMI 35.0-35.9,adult    Anxiety    LTBI (latent tuberculosis infection)       CXR 09/22/22  EXAM DESCRIPTION:  X-RAY CHEST FRONTAL AND LATERAL     CLINICAL HISTORY:  Cough for 3 months     COMPARISON:  11/29/2003     FINDINGS:  Increased AP dimension, and some flattening of the hemidiaphragms this is new since the distant comparison in 2005. Heart and vascularity are unremarkable no peribronchial cuffing but subtle bronchi ectasia noted in the infrahilar regions on both sides. No alveolar infiltrates or lobar pneumonia. No effusion or pneumothorax. Heart and vessels unremarkable.     IMPRESSION:  Subtle flattening of the hemidiaphragms and prominent increased AP dimension in the lateral film is suggestive of air trapping/COPD potentially

## 2022-10-26 ENCOUNTER — Encounter (INDEPENDENT_AMBULATORY_CARE_PROVIDER_SITE_OTHER): Payer: HMO | Admitting: Family Medicine

## 2022-10-26 ENCOUNTER — Encounter (INDEPENDENT_AMBULATORY_CARE_PROVIDER_SITE_OTHER): Payer: Self-pay

## 2022-10-26 ENCOUNTER — Ambulatory Visit (INDEPENDENT_AMBULATORY_CARE_PROVIDER_SITE_OTHER): Payer: HMO | Admitting: Family Medicine

## 2022-10-26 VITALS — BP 122/81 | HR 76 | Temp 97.9°F | Resp 17 | Ht 60.0 in | Wt 165.8 lb

## 2022-10-26 DIAGNOSIS — R053 Chronic cough: Secondary | ICD-10-CM

## 2022-10-26 DIAGNOSIS — K219 Gastro-esophageal reflux disease without esophagitis: Secondary | ICD-10-CM

## 2022-10-26 DIAGNOSIS — R0789 Other chest pain: Secondary | ICD-10-CM

## 2022-10-26 DIAGNOSIS — R7612 Nonspecific reaction to cell mediated immunity measurement of gamma interferon antigen response without active tuberculosis: Secondary | ICD-10-CM

## 2022-10-26 DIAGNOSIS — Z23 Encounter for immunization: Secondary | ICD-10-CM

## 2022-10-26 DIAGNOSIS — R052 Subacute cough: Secondary | ICD-10-CM

## 2022-10-26 DIAGNOSIS — R002 Palpitations: Secondary | ICD-10-CM

## 2022-10-26 NOTE — Progress Notes (Signed)
Maesa Paine is a 37 year old female,who presents for follow up.    Cough has improved.  Now occasional bad taste in mouth, then vomits 1-2 times per day    I reviewed the patient's medical record, including PMH, SH, medications, allergies, and ROS.    Patient Active Problem List   Diagnosis    ASCUS (atypical squamous cells of undetermined significance) on Pap smear    BMI 35.0-35.9,adult    Anxiety    LTBI (latent tuberculosis infection)     Vitals:    10/26/22 1146   BP: 122/81   Pulse: 76   Resp: 17   Temp: 97.9 F (36.6 C)   TempSrc: Temporal   SpO2: 98%   Weight: 75.2 kg (165 lb 12.8 oz)   Height: 5' (1.524 m)     Physical Exam:   General Appearance: healthy, alert, no distress, pleasant affect, cooperative.  Neck:  Supple, no adenopathy, no thyromegaly  Heart:  normal rate and regular rhythm, no murmurs, clicks, or gallops.  Lungs: clear to auscultation and percussion, no chest deformities noted.  Extremities:  no cyanosis, clubbing, or edema.    ASSESSMENT/PLAN:    Tiwana was seen today for follow up.    Diagnoses and all orders for this visit:    Subacute cough    Gastroesophageal reflux disease, unspecified whether esophagitis present            .        ASSESSMENT/PLAN:

## 2022-10-27 ENCOUNTER — Ambulatory Visit
Admission: RE | Admit: 2022-10-27 | Discharge: 2022-10-27 | Disposition: A | Discharge status: Home / Self Care | Payer: HMO | Attending: Family Medicine | Admitting: Family Medicine

## 2022-10-27 DIAGNOSIS — R053 Chronic cough: Secondary | ICD-10-CM | POA: Insufficient documentation

## 2022-10-27 LAB — COMPLETE PFT (SPIRO, VOLUMES, DLCO)
DLCO Uncor: 25.46 ml/min/mmHg
FEF 25/75: 3.18 L/sec

## 2022-10-27 NOTE — Procedures (Signed)
No note

## 2022-10-28 ENCOUNTER — Other Ambulatory Visit (INDEPENDENT_AMBULATORY_CARE_PROVIDER_SITE_OTHER): Payer: Self-pay | Admitting: Family Medicine

## 2022-10-28 DIAGNOSIS — R059 Cough, unspecified: Secondary | ICD-10-CM

## 2022-10-29 MED ORDER — PHENTERMINE HCL 37.5 MG OR TABS
37.5000 mg | ORAL_TABLET | Freq: Every day | ORAL | 2 refills | Status: DC
Start: 2022-10-29 — End: 2023-04-14

## 2022-10-29 MED ORDER — TOPIRAMATE 25 MG OR TABS
50.0000 mg | ORAL_TABLET | Freq: Every day | ORAL | 2 refills | Status: DC
Start: 2022-10-29 — End: 2023-04-14

## 2022-10-30 LAB — COMPLETE PFT (SPIRO, VOLUMES, DLCO)
DLCO Cor % PREDICTED: 127 ml/min/mmHg
DLCO Cor: 24.74 ml/min/mmHg
DLCO Uncor % PREDICTED: 130 ml/min/mmHg
FEF 25/75 % PREDICTED: 112 L/s
FEV1 % PREDICTED: 111 L
FEV1/FVC % PREDICTED: 98 %
FEV1/FVC: 0.83 %
FEV1: 2.78 L
FVC % PREDICTED: 113 L
FVC: 3.36 L
RV (Pleth) % PREDICTED: 97 L
RV (Pleth): 1.32 L
TLC (Pleth)% PREDICTED: 102 L
TLC (Pleth): 4.58 L

## 2022-10-31 ENCOUNTER — Encounter (INDEPENDENT_AMBULATORY_CARE_PROVIDER_SITE_OTHER): Payer: Self-pay | Admitting: Family Medicine

## 2022-11-01 ENCOUNTER — Encounter (INDEPENDENT_AMBULATORY_CARE_PROVIDER_SITE_OTHER): Payer: Self-pay | Admitting: Family Medicine

## 2022-11-13 ENCOUNTER — Ambulatory Visit
Admission: RE | Admit: 2022-11-13 | Discharge: 2022-11-16 | Disposition: A | Discharge status: Home / Self Care | Payer: HMO | Attending: Family Medicine | Admitting: Family Medicine

## 2022-11-13 DIAGNOSIS — R7612 Nonspecific reaction to cell mediated immunity measurement of gamma interferon antigen response without active tuberculosis: Secondary | ICD-10-CM | POA: Insufficient documentation

## 2022-11-13 DIAGNOSIS — R053 Chronic cough: Secondary | ICD-10-CM | POA: Insufficient documentation

## 2022-11-13 LAB — RESPIRATORY CULTURE W/GRAM STAIN

## 2022-11-13 NOTE — Interdisciplinary (Signed)
PFT Lab Note: Sputum induction performed with 7% hypertonic saline. Sample to lab. Tech suggested to patient to administer her SABA inhaler prior to next sputum induction.

## 2022-11-14 ENCOUNTER — Encounter (INDEPENDENT_AMBULATORY_CARE_PROVIDER_SITE_OTHER): Payer: Self-pay | Admitting: Family Medicine

## 2022-11-16 ENCOUNTER — Encounter (INDEPENDENT_AMBULATORY_CARE_PROVIDER_SITE_OTHER): Payer: Self-pay | Admitting: Family Medicine

## 2022-11-16 LAB — M. TUBERCULOSIS AND RIFAMPIN RESISTANCE PCR: M. tuberculosis PCR: NOT DETECTED

## 2022-11-17 ENCOUNTER — Telehealth (INDEPENDENT_AMBULATORY_CARE_PROVIDER_SITE_OTHER): Payer: Self-pay

## 2022-11-17 ENCOUNTER — Telehealth (INDEPENDENT_AMBULATORY_CARE_PROVIDER_SITE_OTHER): Payer: Self-pay | Admitting: Endocrinology

## 2022-11-17 NOTE — Telephone Encounter (Signed)
90 day supply Prescription Refill Request     Who is requesting the refill? Pharmacy via fax    Prescribing provider: Dr. Bubba Hales    Last office visit: 05/20/2022  Next office visit: 04/12/2023    Requested Medication/s: Vitamin D2    Pharmacy Requested:  [id number] [place], [place] - [address]  [address]  [place] [address]  Phone: [phone] Fax: [phone]    Is patient out of medication? N/a    If yes when did patient run out of medication? N/a     Best call back ph? N/a    Has Patient been advised this message will be transmitted to office and if any issues can expect a response within the next 72 hours? N/a      Agent placed in [employer].

## 2022-11-17 NOTE — Telephone Encounter (Signed)
Routing message to provider for review and approval of refill pended    Last Visit: 05/20/2022  Next Visit: 04/12/2023    Medication Requested: vitamin d    Chart Review  Verify no refills available  Verify Last Office Visit/Encounter for updated dose  Verify Last Prescribing Provider  If our records indicate that refills are still available, check Class and E-Prescribing Status, and/or call Pharmacy to verify    [x]  Seen within 1 year or recommended time frame - OK to pend previous refill amount  []  Seen >1 year ago - OK 30 day fill ONLY, no refills - Contact Patient before sending to provider and advise to follow up    Before Routing  Ensure no hard stops or warnings prior to routing to provider  Ensure encounter is routed as Rx Request vs Patient Call/Message

## 2022-11-17 NOTE — Telephone Encounter (Signed)
[facility] RN Note:     Pulmonary referral review:   NEW-Routine- General Pulmonary. Dx: Chronic cough. Complete PFT's on 10/27/22.     Shanda Bumps, BSN, RN  [facility]  P: [phone]

## 2022-11-18 ENCOUNTER — Other Ambulatory Visit (INDEPENDENT_AMBULATORY_CARE_PROVIDER_SITE_OTHER): Payer: Self-pay | Admitting: Endocrinology

## 2022-11-18 DIAGNOSIS — R7989 Other specified abnormal findings of blood chemistry: Secondary | ICD-10-CM

## 2022-11-18 MED ORDER — VITAMIN D (ERGOCALCIFEROL) 1.25 MG (50000 UT) PO CAPS
1.0000 | ORAL_CAPSULE | ORAL | 3 refills | Status: AC
Start: 2022-11-18 — End: ?

## 2022-11-19 ENCOUNTER — Telehealth (INDEPENDENT_AMBULATORY_CARE_PROVIDER_SITE_OTHER): Payer: Self-pay

## 2022-11-19 ENCOUNTER — Encounter (INDEPENDENT_AMBULATORY_CARE_PROVIDER_SITE_OTHER): Payer: Self-pay | Admitting: Family Medicine

## 2022-11-19 ENCOUNTER — Encounter (INDEPENDENT_AMBULATORY_CARE_PROVIDER_SITE_OTHER): Payer: Self-pay | Admitting: Hospital

## 2022-11-19 NOTE — Telephone Encounter (Signed)
Pulmonary Referral Review:     Routine-NEW-Gen Pulm. Complete PFT preformed on 10/27/22.

## 2022-12-08 ENCOUNTER — Encounter (INDEPENDENT_AMBULATORY_CARE_PROVIDER_SITE_OTHER): Payer: Self-pay | Admitting: Internal Medicine

## 2022-12-08 DIAGNOSIS — Z0000 Encounter for general adult medical examination without abnormal findings: Secondary | ICD-10-CM

## 2022-12-16 ENCOUNTER — Encounter (INDEPENDENT_AMBULATORY_CARE_PROVIDER_SITE_OTHER): Payer: HMO | Admitting: Family Medicine

## 2022-12-28 ENCOUNTER — Encounter (INDEPENDENT_AMBULATORY_CARE_PROVIDER_SITE_OTHER): Payer: Self-pay | Admitting: Hospital

## 2023-01-10 LAB — AFB CULTURE W/STAIN
AFB Culture Result: NO GROWTH
AFB Smear Result: NEGATIVE

## 2023-01-19 ENCOUNTER — Encounter (INDEPENDENT_AMBULATORY_CARE_PROVIDER_SITE_OTHER): Payer: Self-pay | Admitting: Hospital

## 2023-01-22 IMAGING — MR COLUNA^CERVICAL
4 series · 48 of 48 positions shown · non-contrast
Comparison: none

[Series 2: STIR · coronal · 3.0mm · 0.86mm/px · 7 of 12 slices shown]
[im 1/12]
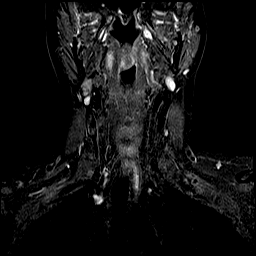
[im 2/12]
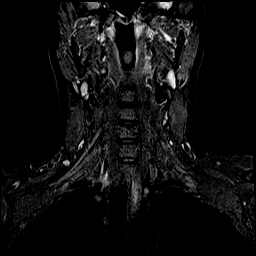
[im 4/12]
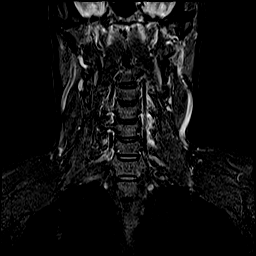
[im 6/12]
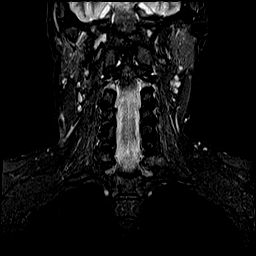
[im 8/12]
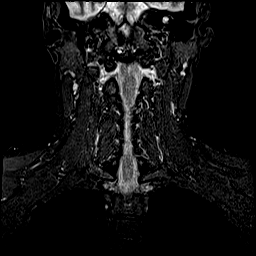
[im 10/12]
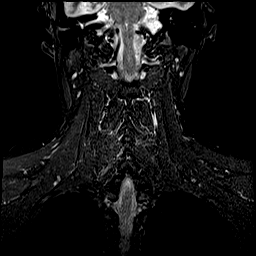
[im 12/12]
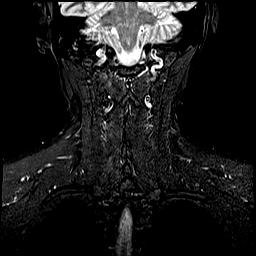

[Series 3: T2 · sagittal · 3.0mm · 0.70mm/px · 7 of 12 slices shown (1 of 2)]
[im 1/12]
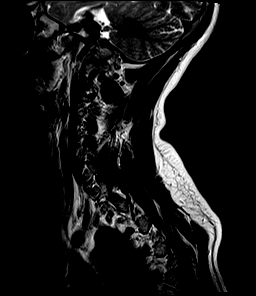
[im 2/12]
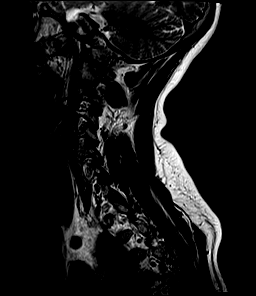
[im 4/12]
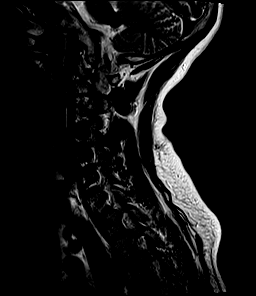
[im 6/12]
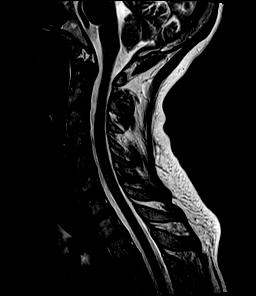
[im 8/12]
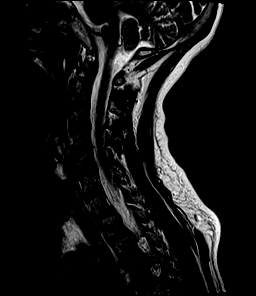
[im 10/12]
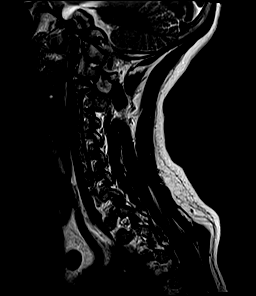
[im 12/12]
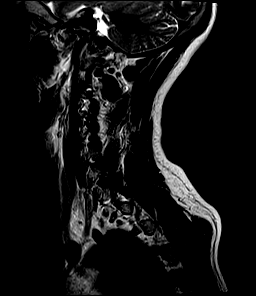

[Series 4: T1 · sagittal · 3.0mm · 0.70mm/px · 7 of 12 slices shown]
[im 1/12]
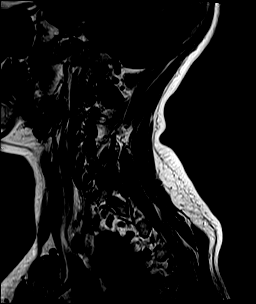
[im 2/12]
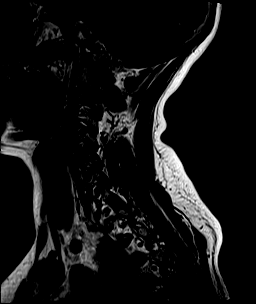
[im 4/12]
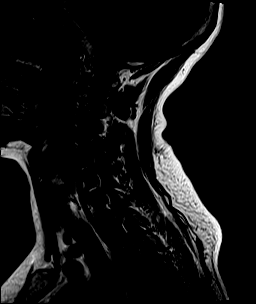
[im 6/12]
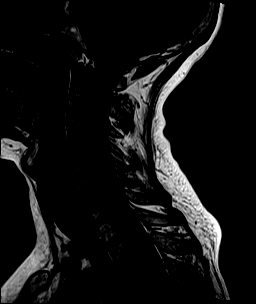
[im 8/12]
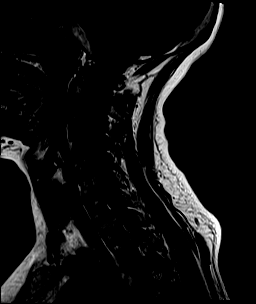
[im 10/12]
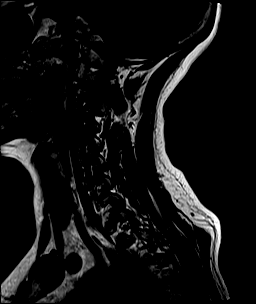
[im 12/12]
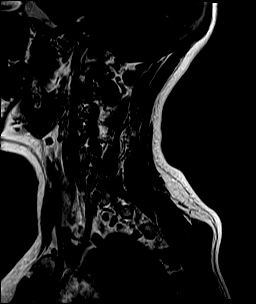

[Series 5: T2 · axial · 2.0mm · 0.78mm/px · z∈[-70,+22]mm · 27 of 48 slices shown (2 of 2)]
[im 1/48]
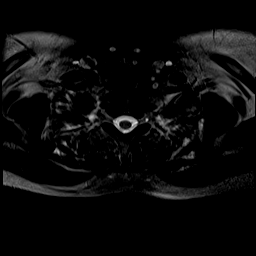
[im 2/48]
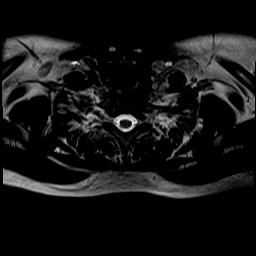
[im 4/48]
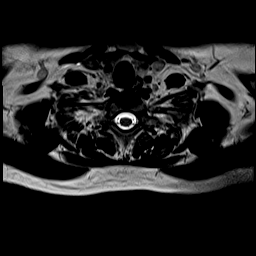
[im 6/48]
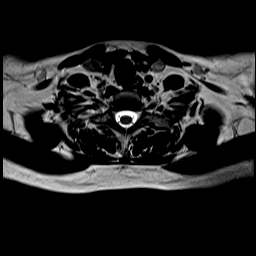
[im 8/48]
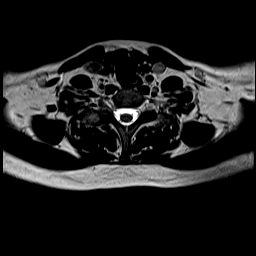
[im 10/48]
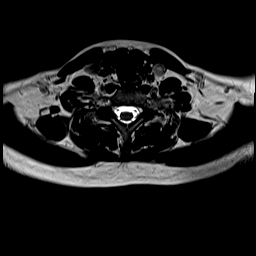
[im 11/48]
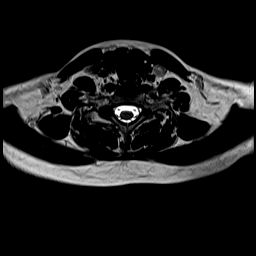
[im 13/48]
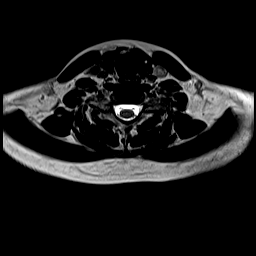
[im 15/48]
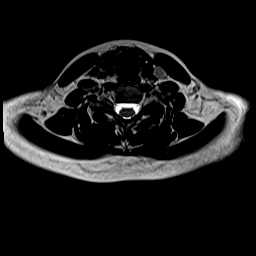
[im 17/48]
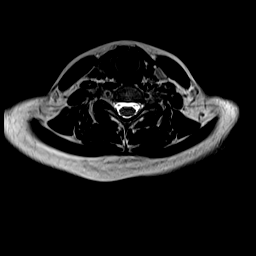
[im 19/48]
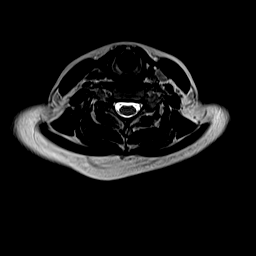
[im 20/48]
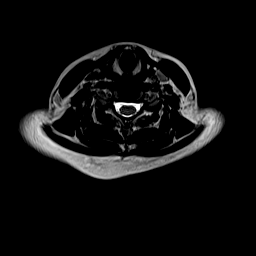
[im 22/48]
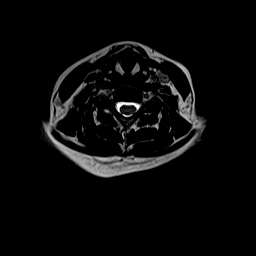
[im 24/48]
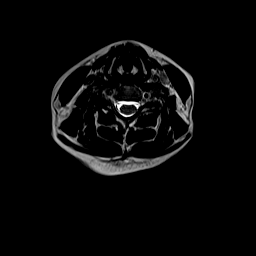
[im 26/48]
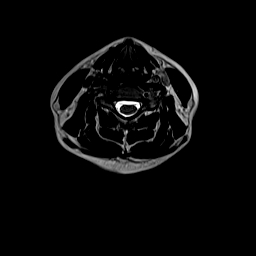
[im 28/48]
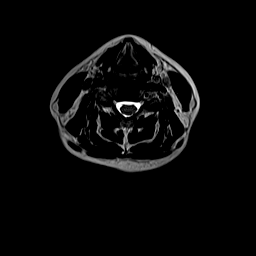
[im 29/48]
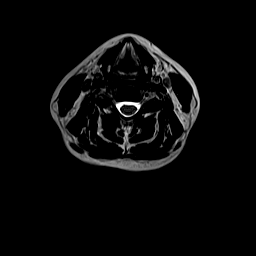
[im 31/48]
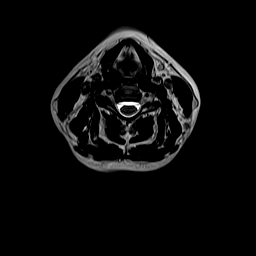
[im 33/48]
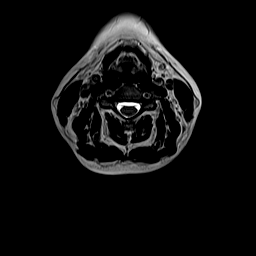
[im 35/48]
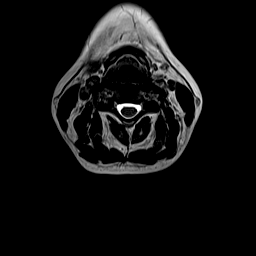
[im 37/48]
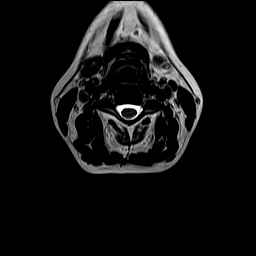
[im 38/48]
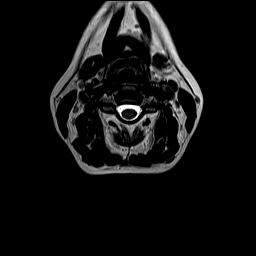
[im 40/48]
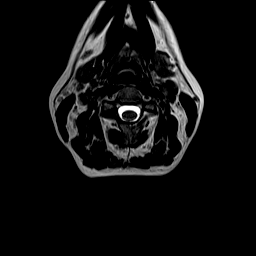
[im 42/48]
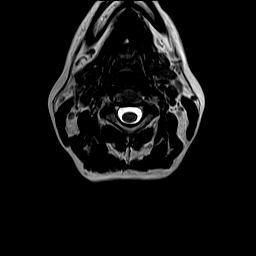
[im 44/48]
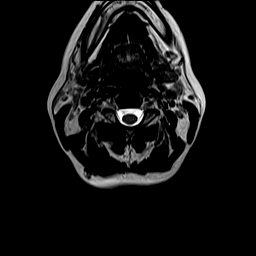
[im 46/48]
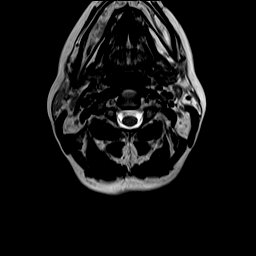
[im 48/48]
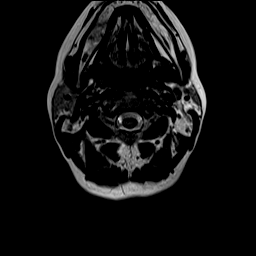

[48 of 48 positions shown; findings below may reference images not displayed]

RESSONÂNCIA MAGNÉTICA DE COLUNA CERVICAL

TÉCNICA: Exame realizado em equipamento de ressonância magnética com sequências, ponderações e planos específicos para o segmento de interesse, sem a administração endovenosa do meio de contraste.

RESULTADO:
Os corpos vertebrais estudados são alinhados e apresentam altura, forma e intensidade de sinal usuais.
Elementos posteriores estudados íntegros.
O canal vertebral ósseo apresenta amplitude usual.
Os discos intervertebrais têm altura e sinal usuais.
Mínima protrusão discal em C5-C6, tocando a face ventral do saco dural e com discreta insinuação para os forames bilateralmente, sem evidências de conflito radicular ou redução significativa da amplitude desses forames.
Os forames de conjugação estudados são livres e apresentam amplitudes usuais.
A medula apresenta forma, dimensões e intensidade de sinal usuais nas regiõesestudadas.

CONCLUSÃO:
Mínima protrusão discal em C5-C6, tocando a face ventral do saco dural e com discreta insinuação para os forames bilateralmente, sem evidências de conflito radicular ou redução significativa da amplitude desses forames.

## 2023-01-22 IMAGING — MR COLUNA^LOMBAR
4 series · 43 of 48 positions shown · non-contrast
Comparison: none

[Series 7: STIR · coronal · 3.5mm · 1.03mm/px · 9 of 16 slices shown]
[im 1/16]
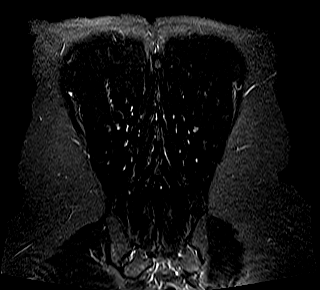
[im 3/16]
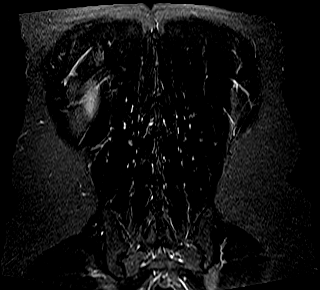
[im 5/16]
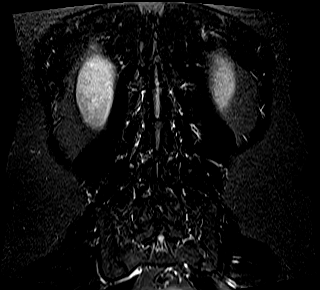
[im 7/16]
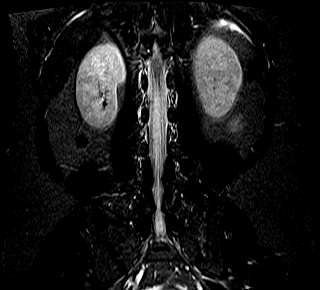
[im 9/16]
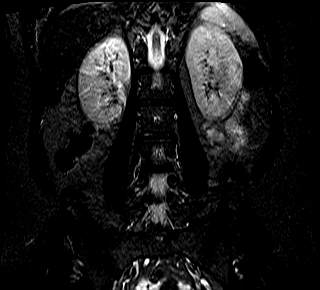
[im 11/16]
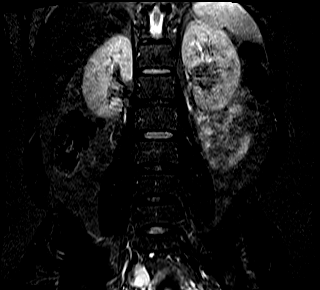
[im 13/16]
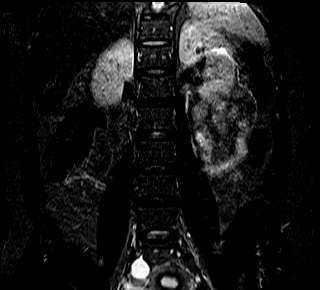
[im 14/16]
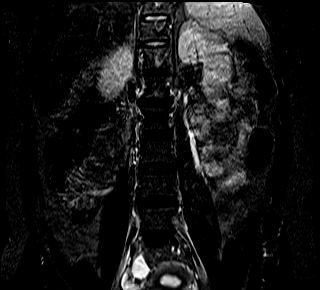
[im 16/16]
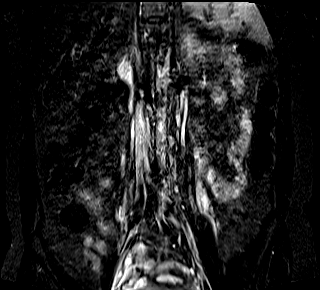

[Series 8: T2 · sagittal · 4.0mm · 0.73mm/px · 10 of 12 slices shown (1 of 2)]
[im 1/12]
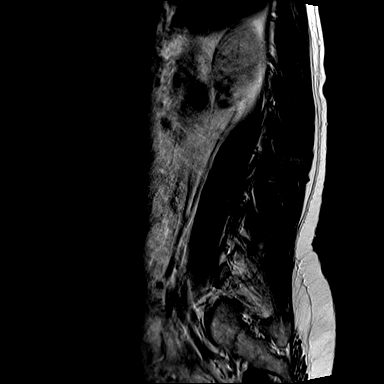
[im 2/12]
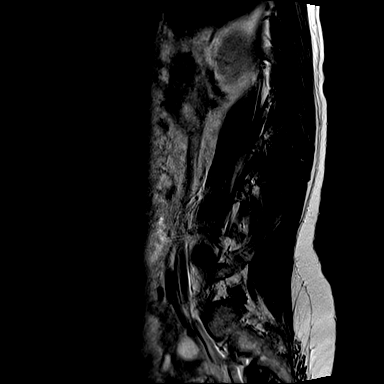
[im 3/12]
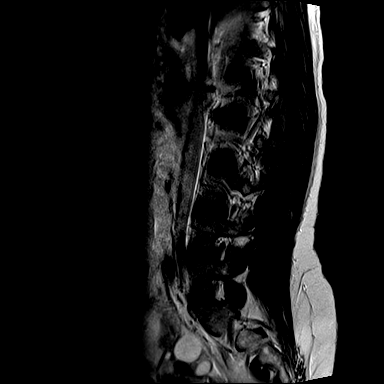
[im 4/12]
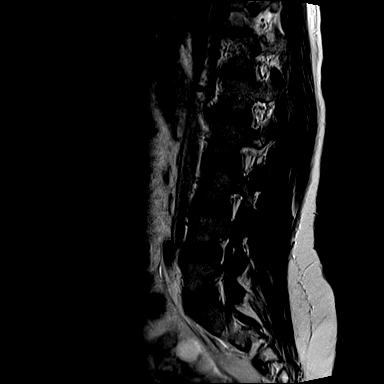
[im 5/12]
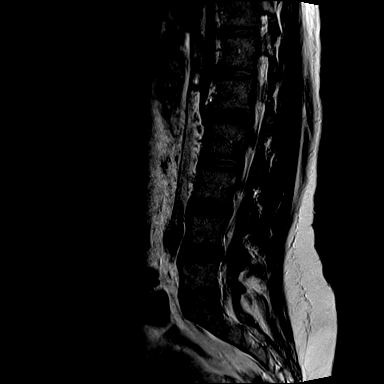
[im 7/12]
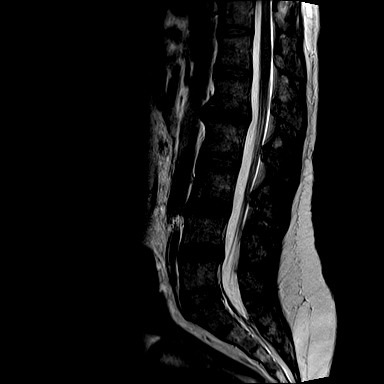
[im 8/12]
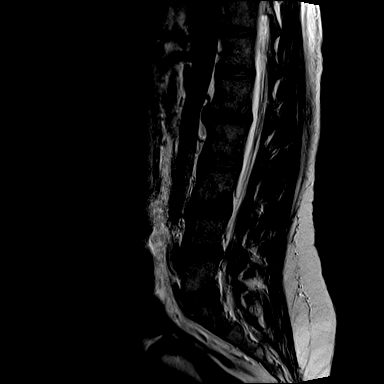
[im 9/12]
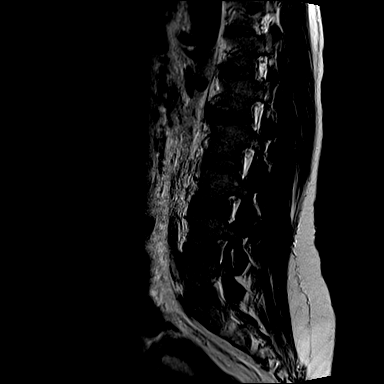
[im 10/12]
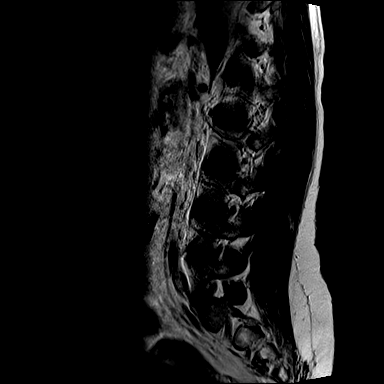
[im 12/12]
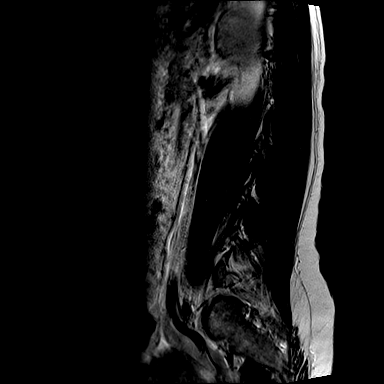

[Series 9: T1 · sagittal · 4.0mm · 0.88mm/px · 8 of 12 slices shown]
[im 1/12]
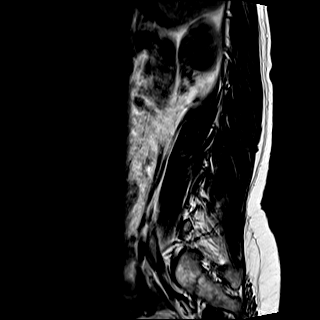
[im 2/12]
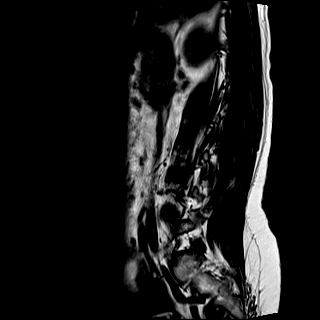
[im 4/12]
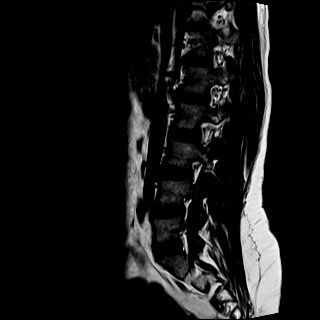
[im 5/12]
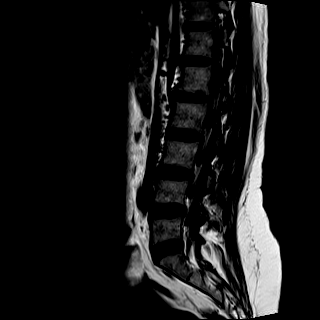
[im 7/12]
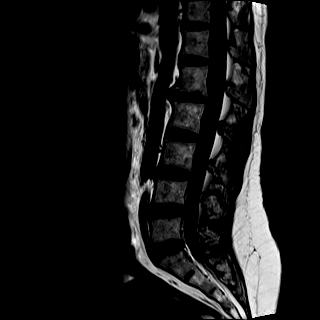
[im 8/12]
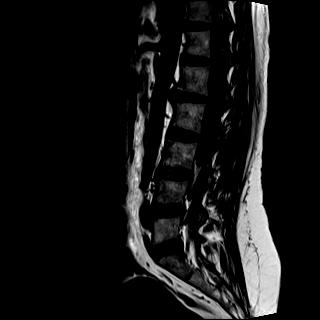
[im 10/12]
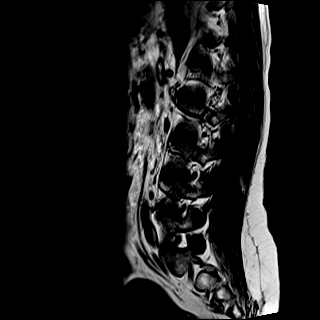
[im 12/12]
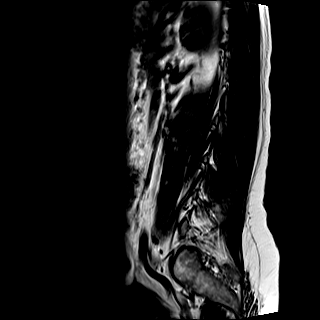

[Series 10: T2 · axial · 4.0mm · 0.78mm/px · z∈[-494,-316]mm · 16 of 20 slices shown (2 of 2)]
[im 1/20]
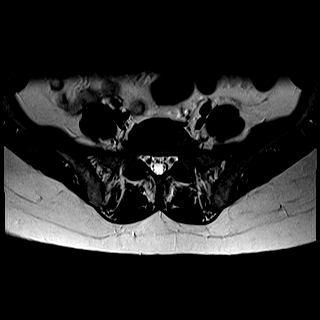
[im 2/20]
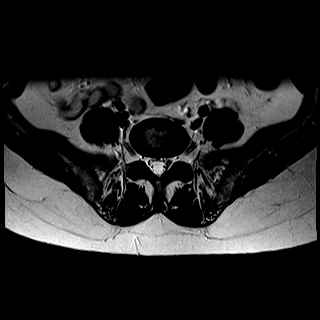
[im 3/20]
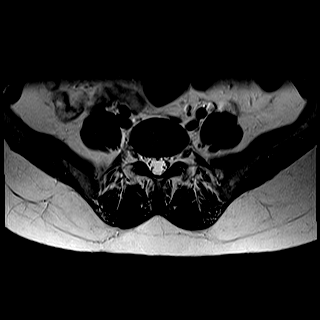
[im 4/20]
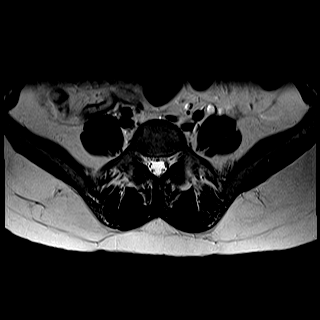
[im 6/20]
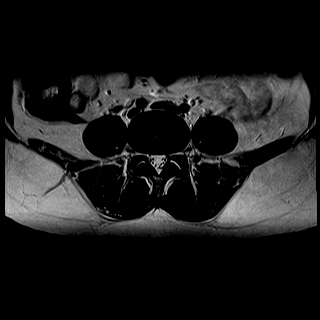
[im 7/20]
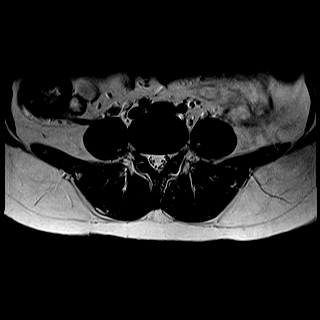
[im 8/20]
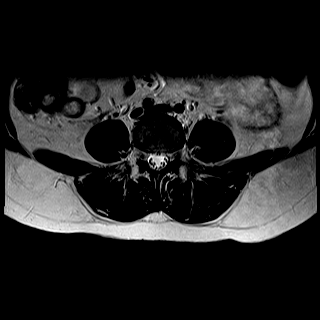
[im 9/20]
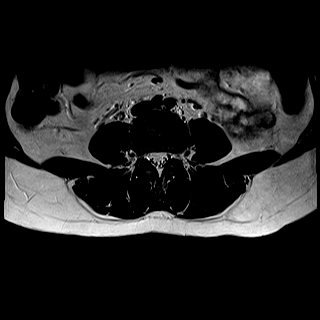
[im 11/20]
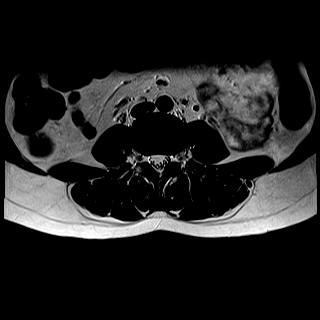
[im 12/20]
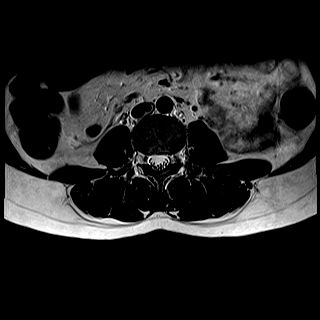
[im 13/20]
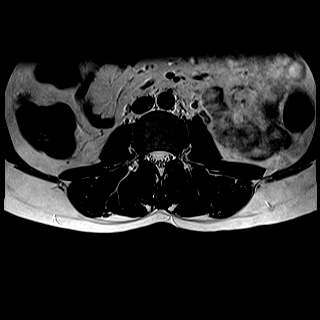
[im 14/20]
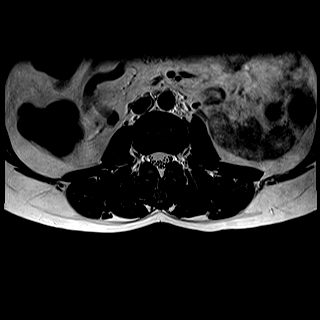
[im 16/20]
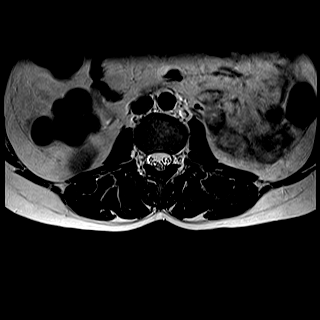
[im 17/20]
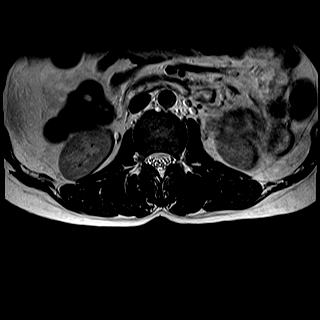
[im 18/20]
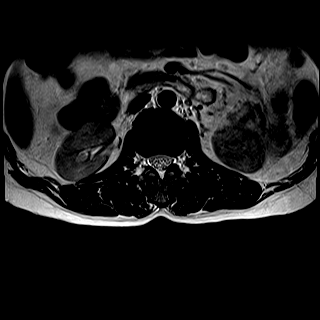
[im 20/20]
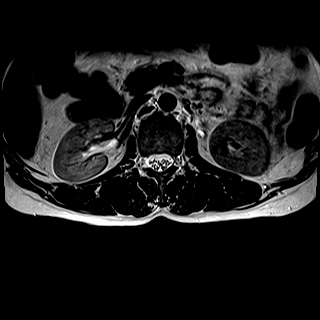

[43 of 48 positions shown; findings below may reference images not displayed]

RESSONÂNCIA MAGNÉTICA DA COLUNA LOMBOSSACRA

TÉCNICA:
Exame realizado em equipamento de ressonância magnética com sequências, ponderações e planos específicos para o segmento de interesse, sem a administração endovenosa do meio de contraste.

RESULTADO:
Bom alinhamento dos corpos vertebrais lombares.
Corpos vertebrais com alturas preservadas.
Osteofitose nos corpos vertebrais lombares.
Pedículos, lâminas, processos transversos e espinhosos preservados.
Articulações interapofisárias preservadas.
Sinais de desidratação discal em L1-L2 e L3-L4.

Nível L1-L2: Abaulamento discal simétrico , comprimindo a face ventral do saco dural, sem conflitos radiculares.
Nível L3-L4: Abaulamento discal simétrico , comprimindo a face ventral do saco dural, sem conflitos radiculares.
Nível L4-L5: Abaulamento discal simétrico , comprimindo a face ventral do saco dural, sem conflitos radiculares.

Demais discos intervertebrais sem alterações significativas.
Canal vertebral e forames intervertebrais com amplitude preservada nos demais segmentos.
Espaço liquórico livre nos demais segmentos.
Raízes nervosas foraminais preservadas.
Cone medular com contornos regulares e sinal homogêneo.
Partes moles paravertebrais com aspecto preservado.

CONCLUSÃO:
Espondilose lombar.
Discopatia degenerativa acima descrita.

## 2023-04-03 ENCOUNTER — Encounter (INDEPENDENT_AMBULATORY_CARE_PROVIDER_SITE_OTHER): Payer: Self-pay

## 2023-04-03 ENCOUNTER — Emergency Department (INDEPENDENT_AMBULATORY_CARE_PROVIDER_SITE_OTHER): Payer: HMO | Admitting: Emergency Medicine

## 2023-04-03 VITALS — BP 135/103 | HR 78 | Temp 97.7°F | Resp 18

## 2023-04-03 DIAGNOSIS — R21 Rash and other nonspecific skin eruption: Secondary | ICD-10-CM

## 2023-04-03 MED ORDER — DIPHENHYDRAMINE HCL 50 MG OR TABS
50.0000 mg | ORAL_TABLET | Freq: Three times a day (TID) | ORAL | 0 refills | Status: AC | PRN
Start: 2023-04-03 — End: ?

## 2023-04-03 MED ORDER — METHYLPREDNISOLONE 4 MG OR KIT
ORAL_TABLET | ORAL | 0 refills | Status: AC
Start: 2023-04-03 — End: ?

## 2023-04-03 NOTE — Progress Notes (Signed)
 Ruben Pyka JANETTE Hopkins  MRN: [id number]  DOB: Apr 05, 1985  PMD: Calleen Catena A    CC: Rash (Pt CO generalized rash that is very itchy x 2 days, pt has been taking benadryl  every 4 hours. Denies possible allergen)    Chief Complaint   Patient presents with    Rash     Pt CO generalized rash that is very itchy x 2 days, pt has been taking benadryl  every 4 hours. Denies possible allergen       HPI: Marynell Bies is a 38 year old female  has a past medical history of Blood transfusion declined because patient is Jehovah's Witness, Chalazion left lower eyelid (10/02/2020), Hordeolum of left eye (10/01/2020), HPV in female, Migraine, Obesity, and Squamous blepharitis of both eyes (10/02/2020).    She has no past medical history of Acute deep vein thrombosis of proximal leg (CMS-HCC), AF (atrial fibrillation) (CMS-HCC), Allergic rhinitis, Anemia, Asthma, Clotting disorder (CMS-HCC), Congestive heart failure (CMS-HCC), COPD (chronic obstructive pulmonary disease) (CMS-HCC), Diabetes mellitus (CMS-HCC), GERD (gastroesophageal reflux disease), Glaucoma, Heart disease, HIV disease (CMS-HCC), Hypercholesterolemia, Hypertension, Hypothyroidism, IBD (inflammatory bowel disease), Ischemic heart disease, Major depressive disorder, single episode, OSA (obstructive sleep apnea), Pneumonia, Polyarthropathy or polyarthritis of multiple sites, Seizures (CMS-HCC), or UTI (urinary tract infection). who presents with c/o Rash and itching all over.  Patient states that since last Tuesday she started having generalized itching from the head down to the toes.  She also noticed a rash which comes and goes.  Patient has been taking some Benadryl  but only 25 mg and it has not been helping her.  She denies eating any new food.  Patient has not been taking any new medications although she is on a lot of medications that she has been taking for a while.  There has been no new soap detergent or clothes.    PMHx: see hpi  PSHx:  has a past  surgical history that includes cholecystectomy, lap (2006) and c-section (2005).  Shx:  reports that she has quit smoking. She has never been exposed to tobacco smoke. She has never used smokeless tobacco. She reports current alcohol use of about 5.0 - 7.5 standard drinks of alcohol per week. She reports that she does not use drugs.  FamHx: family history includes Cancer in her m grandmother; Cancer (age of onset: 19) in her father; No Known Problems in her mother.  Meds: has a current medication list which includes the following prescription(s): albuterol , calcipotriene , clobetasol  propionate, diphenhydramine , ergocalciferol , escitalopram , methylprednisolone , omeprazole , phentermine , spacer, topiramate , and triamcinolone .  ALL: Penicillins    ROS: Except as documented (above in HPI), all other systems were reviewed and were negative    EXAM  BP (!) 135/103   Pulse 78   Temp 97.7 F (36.5 C)   Resp 18   SpO2 98%     Vitals noted     GEN nad a&o nontoxic well appearing but itching all over.  HEENT NC/AT  EYE: perrla, eomi, anicteric  NOSE: MMM, turbinates wnl  OP w/ mmm, erythema, exudate, petechia, or lesions noted. No tonsillar abscesses. Uvula midline  NECK nl rom no jvd. No anterior or posterior cervical lymphadenopathy  CV rrr no murms, rubs or clicks  LUNGS ctab, no wheezing or crackles  BACK normal curvature, no CVAT, no flank pain  ABD NABS, soft, non-tender, no mass, no hepatosplenomegaly   EXT no edema nl rom all joints   SKIN diffuse rash which is nonspecific and appears to be hives  and scattered excoriations due to scratching because of itching.    NEURO mae nl mentation, memory, speech and gait    Studies:     Results for orders placed or performed during the hospital encounter of 11/13/22   Acid-Fast Bacillus (AFB) Culture and AFB Stain Sterile Container Sputum    Specimen: Sputum    Sputum, Induced   Result Value Ref Range    AFB Smear Result Negative for acid-fast bacilli     AFB Culture Result  No growth of AFB after 8 weeks of incubation.    Mycobacterium tuberculosis/Rifampin  Resistance PCR Sterile Container   Result Value Ref Range    M. tuberculosis/Rifampin  Resistance PCR Source Sputum-Induced     M. tuberculosis PCR Not Detected Not Detected   Respiratory Cult w/Gram Stain, Routine Sputum    Specimen: Sputum    Sputum, Induced   Result Value Ref Range    Respiratory Culture Result       Flora resembling saliva, unsatisfactory specimen.  Please recollect.      Gram Stain Result       Flora resembling saliva, unsatisfactory specimen.  Please recollect.         No results found.    NEW MEDICATIONS             methylPREDNISolone  (MEDROL  DOSEPACK) 4 MG tablet Take as directed on   package    diphenhydrAMINE  (BENADRYL ) 50 MG tablet Take 1 tablet (50 mg) by mouth 3   times daily as needed for Itching.         A/P:   Well appearing patient with c/o generalized itching and rash.  I am putting the patient on steroids for a few days and also proper dose of Benadryl .  She has been referred to the allergy in case the itching does not stop after taking the steroids.  Medical records were reviewed by myself.          Patient instructed to follow up with PCP within 1 week, as needed. Strict ER precautions reviewed. Return precautions discussed. Patient understands and agrees with the plan of care.      ICD-10-CM ICD-9-CM    1. Rash and other nonspecific skin eruption  R21 782.1 methylPREDNISolone  (MEDROL  DOSEPACK) 4 MG tablet      diphenhydrAMINE  (BENADRYL ) 50 MG tablet      Consult/Referral to Allergy Clinic

## 2023-04-03 NOTE — Patient Instructions (Signed)
 As we discussed please take the steroids and Benadryl  as advised.  Also I have made a referral for you to see the allergy specialist if the rash comes back after taking the steroids.  Please note that if you take the Benadryl  you can not drive or do anything that requires concentration.

## 2023-04-06 ENCOUNTER — Encounter (INDEPENDENT_AMBULATORY_CARE_PROVIDER_SITE_OTHER): Payer: Self-pay | Admitting: Hospital

## 2023-04-06 ENCOUNTER — Encounter (INDEPENDENT_AMBULATORY_CARE_PROVIDER_SITE_OTHER): Payer: Self-pay

## 2023-04-12 ENCOUNTER — Telehealth (INDEPENDENT_AMBULATORY_CARE_PROVIDER_SITE_OTHER): Payer: HMO | Admitting: Endocrinology

## 2023-04-14 ENCOUNTER — Telehealth (INDEPENDENT_AMBULATORY_CARE_PROVIDER_SITE_OTHER): Payer: HMO | Admitting: "Endocrinology

## 2023-04-14 ENCOUNTER — Encounter (INDEPENDENT_AMBULATORY_CARE_PROVIDER_SITE_OTHER): Payer: Self-pay

## 2023-04-14 ENCOUNTER — Other Ambulatory Visit: Payer: Self-pay

## 2023-04-14 VITALS — Ht 60.0 in | Wt 155.0 lb

## 2023-04-14 DIAGNOSIS — Z6830 Body mass index (BMI) 30.0-30.9, adult: Secondary | ICD-10-CM

## 2023-04-14 DIAGNOSIS — R635 Abnormal weight gain: Secondary | ICD-10-CM

## 2023-04-14 DIAGNOSIS — E66812 Obesity, class 2: Secondary | ICD-10-CM

## 2023-04-14 DIAGNOSIS — E66811 Obesity, class 1: Secondary | ICD-10-CM

## 2023-04-14 MED ORDER — PHENTERMINE HCL 37.5 MG OR TABS
37.5000 mg | ORAL_TABLET | Freq: Every day | ORAL | 2 refills | Status: DC
Start: 2023-04-14 — End: 2023-05-20

## 2023-04-14 MED ORDER — TIRZEPATIDE-WEIGHT MANAGEMENT 2.5 MG/0.5ML SC SOAJ
2.5000 mg | SUBCUTANEOUS | 2 refills | Status: DC
Start: 2023-04-14 — End: 2023-05-20
  Filled 2023-04-14: qty 2, 28d supply, fill #0

## 2023-04-14 MED ORDER — TOPIRAMATE 25 MG OR TABS
50.0000 mg | ORAL_TABLET | Freq: Every day | ORAL | 2 refills | Status: AC
Start: 2023-04-14 — End: ?

## 2023-04-14 NOTE — Progress Notes (Addendum)
 [facility]  [facility]  Obesity Medicine  [facility]    Patient Name: Ruth Hall  MRN: [id number]  Date of Birth: Mar 28, 1986  Encounter Date/Time: 04/14/2023    CC:   Chief Complaint   Patient presents with    Follow Up    Weight Management     REQUESTING PHYSICIAN: Albertine Glennis Marines*  History obtained from the patient and additional records in the EMR .     WEIGHTS         EVENT    DATE  WT/BMI  Wt Control MD #1 (TOC)  04/14/2023 155 lb    Wt Readings from Last 10 Encounters:   04/14/23 70.3 kg (155 lb)   10/26/22 75.2 kg (165 lb 12.8 oz)   10/16/22 74.4 kg (164 lb)   09/22/22 76.4 kg (168 lb 6.4 oz)   05/20/22 84.8 kg (187 lb)   09/17/21 83.7 kg (184 lb 9.6 oz)   08/04/21 82.6 kg (182 lb)   05/15/21 80.6 kg (177 lb 12.8 oz)   04/08/21 82.6 kg (182 lb)   02/10/21 82.6 kg (182 lb)         HPI   Tyishia Aune is a 38 year old female with Body mass index is 30.27 kg/m., PMH obesity class 2 --> class 1, knee pain, migraine who presents for weight control follow-up.     Patient of Dr. Albertine, last seen 05/20/2022  Prior visit:  09/2020- Est care with Dr. Albertine- 195 lb - initially trialed phen/top  01/2021- phen 30 mg/ topiramate  25 mg  07/2021- had d/c phentermine /topamax  wk prior, resumed topamax , got 1 dose of Ozempic  at [facility]- plan to cont topamax , dc phentermine . Start Wegovy   04/2022- increase topamax  50 mg, start wegovy  if covered, otherwise resume phentermine  at high dose 37.5 mg      INTERVAL:  Reports prior intermittent use of phentermine , currently has been on for past 1 month consistently  Currently on phentermine  37.5 mg, topiramate  50 mg  Reports never received Wegovy  2/2 insurance coverage  No recent BP, no HA, changes in vision, palpitations, CP/ SOB  Exercise- cardio at gym 4x/ wk    Weight goals: pt reports goals 137 lb    Weight history: per review difficulty with wt loss since 38 yo. Peak wt  250 lb in pregnancy (2005).  Lost weight with phentermine  10 years ago -->165-170 lbs     Social and Psychological History:  Hx of Eating disorders/Binge eating:  denies  Hx of Mental health concerns: anxiety, depression  SI/SA: denies  Hx Substance use/dependence: denies  Hx Alcohol: denies  Hx Smoking: former, 10 yr prior    Weight-related Conditions:  -OSA:  denies  -CVD/CVA:  denies  -HTN: denies  -Dyslipidemia: denies   -DM2: denies  -MASLD: denies  -GERD: former  -VTE: denies  -DJD/OA: denies    ROS:   -Hx of glaucoma: denies  -Hx cholelithiasis: h/o cholecystectomy  -Hx of nephrolithiasis: denies  -Hx of hyperthyroidism: denies  -History of valvular or ischemic heart disease: denies  -Hx of n/v/d/c: denies  -Hx of psychiatric illness: denies  -Hx of seizures: denies  -Menses/birth control if F: denies, not ttc  -Hx pancreatitis: denies  -Personal or FH of MEN2/MTC: denies        ALLERGIES & MEDICATIONS   Allergies  Penicillins      Current Outpatient Medications:     albuterol  108 (90  Base) MCG/ACT inhaler, Inhale 2 puffs by mouth every 6 hours as needed (cough, congestion, and/or wheezing. Use with spacer)., Disp: 8 g, Rfl: 1    calcipotriene  (DOVONEX ) 0.005 % ointment, Apply topically to affected area twice a day on the weekends to affected areas on the elbows and knees, Disp: 60 g, Rfl: 5    clobetasol  propionate (TMOVATE) 0.05 % solution, Use a small amount as directed once or twice per week to the scalp to prevent flares. When flaring, using once daily M-F, taking a break on the weekends., Disp: 50 mL, Rfl: 11    diphenhydrAMINE  (BENADRYL ) 50 MG tablet, Take 1 tablet (50 mg) by mouth 3 times daily as needed for Itching., Disp: 30 tablet, Rfl: 0    ergocalciferol  (VITAMIN D ) 50000 UNIT capsule, Take 1 capsule (50,000 Units) by mouth every 14 days., Disp: 6 capsule, Rfl: 3    escitalopram  (LEXAPRO ) 10 MG tablet, Take 1 tablet (10 mg) by mouth daily., Disp: 90 tablet, Rfl: 3    methylPREDNISolone  (MEDROL   DOSEPACK) 4 MG tablet, Take as directed on package, Disp: 1 each, Rfl: 0    omeprazole  (PRILOSEC) 20 MG capsule, Take 1 capsule (20 mg) by mouth every morning (before breakfast)., Disp: 30 capsule, Rfl: 2    phentermine  (ADIPEX-P ) 37.5 MG tablet, Take 1 tablet (37.5 mg) by mouth every morning (before breakfast)., Disp: 30 tablet, Rfl: 2    spacer (AEROCHAMBER) MISC, Use as directed, Disp: 1 each, Rfl: 0    tirzepatide -weight management (ZEPBOUND ) 2.5 MG/0.5ML injection pen, Inject 0.5 mL (2.5 mg) under the skin every 7 days., Disp: 2 mL, Rfl: 2    topiramate  (TOPAMAX ) 25 MG tablet, Take 2 tablets (50 mg) by mouth daily. Every evening, Disp: 180 tablet, Rfl: 2    triamcinolone  (KENALOG ) 0.1 % cream, Apply 1 Application. topically 2 times daily. Apply a thin layer as directed, Disp: 45 g, Rfl: 0    Past Medical History:   Diagnosis Date    Blood transfusion declined because patient is Jehovah's Witness     Chalazion left lower eyelid 10/02/2020    Hordeolum of left eye 10/01/2020    HPV in female     Migraine     Obesity     Squamous blepharitis of both eyes 10/02/2020       Past Surgical History:   Procedure Laterality Date    CHOLECYSTECTOMY, LAP  2006    c-section  2005       Socioeconomic History    Marital status: Single    Highest education level: High school graduate   Occupational History    Occupation: receptionist   Tobacco Use    Smoking status: Former     Passive exposure: Never    Smokeless tobacco: Never   Substance and Sexual Activity    Alcohol use: Yes     Alcohol/week: 5.0 - 7.5 standard drinks of alcohol     Types: 2 - 3 Shots of liquor per week    Drug use: No    Sexual activity: Yes     Partners: Male     Birth control/protection: Pill     Comment: Ortho-Evra patch   Social History Narrative    2019 - Lives with mom, 14yo son, and boyfriend.    Cats, dogs, guinea pig, fish       EXAM   Ht 5' (1.524 m)   Wt 70.3 kg (155 lb)   BMI 30.27 kg/m   GEN:  Well developed  adult in no acute  distress  EYES: Anicteric. No scleral injection.   ENT:  Normal phonation.   CV: RRR, no murmurs  PULM: Breathing comfortably, speaking in full sentences.   NEURO: Alert. Oriented.   SKIN: No facial rash.   PSYCH:  Pleasant. Appropriate affect.      STUDIES     Lab Results   Component Value Date/Time    A1C 5.3 10/16/2022 10:26 AM    A1C 5.6 08/20/2020 10:31 AM    A1C 5.3 09/23/2017 10:15 AM    A1C 5.3 11/29/2014 10:28 AM    A1C 5.4 05/05/2010 11:20 AM    CHOL 151 10/16/2022 10:26 AM    LDLCALC 77 10/16/2022 10:26 AM    HDL 56 10/16/2022 10:26 AM    TRIG 88 10/16/2022 10:26 AM       Lab Results   Component Value Date    A1C 5.3 10/16/2022    A1C 5.6 08/20/2020    A1C 5.3 09/23/2017     Lab Results   Component Value Date    GLU 87 10/16/2022    CREAT 0.87 10/16/2022    GFRNON >60 08/20/2020    K 4.0 10/16/2022    AST 14 10/16/2022    ALT 22 10/16/2022     Lab Results   Component Value Date    CHOL 151 10/16/2022    HDL 56 10/16/2022    LDLCALC 77 10/16/2022    TRIG 88 10/16/2022     No results found for: MICAL[facility], [facility]  Lab Results   Component Value Date    TSH 0.83 10/16/2022       IMPRESSION / RECOMMENDATIONS   Tyjae Shvartsman is a 38 year old female with Body mass index is 30.27 kg/m., PMH obesity class 2 --> class 1, knee pain, migraine who presents for weight control follow-up.     Initially referred for consideration of weight management options as part of treatment for comorbidities and for prevention of further obesity-related metabolic complications.     Patient is not a candidate for bariatric surgery.  ? ??  We reviewed recommendations in detail for diet and physical activity. Recommend close monitoring of caloric intake for caloric restriction, avoiding processed foods, and focus on Mediterranean diet principles. She goes to gym 4x/ wk. Recommend participating in 150-300 minutes of moderate-intensity exercise per week.    Screened for contraindications: (patient is not pregnant or  breastfeeding)  Phentermine : denies CV disease (arrhythmias, HF, CAD, CVA), uncontrolled HTN, uncontrolled hyperthyroidism, glaucoma, within 14 days of MAOI  Topiramate : kidney stone  GLP1: denies gastroparesis, gallstones, n/v/d/c, pancreatitis, personal/ fhc MTC/ MEN2 syndrome      Pt desire GLP1. Currently maintaining wt 155 lb has again been on phentermine / topiramate  for past 1 month. Takes regular pauses from medication.    Plan:    - re-trial coverage for GLP1  - start Zepbound  2.5 mg once weekly, increase to 5 mg once weekly in 4 weeks as tolerated- reviewed s/e not limited to n/v, diarrhea, constipation, pancreatitis, low BG with insulin, retinopathy, AKI, decreased gastric motility/ potentially ileus, hold 1 week pre-op  - if start, can gradual wean phentermine  to every other day for 2 weeks, the discontinue phenteramine and topiramate    - until start GLP1 can continue phentermine  37.5 mg and topiramate  50 mg daily   I clearly discussed with patient today that use of phentermine  alone longer than 12 weeks is considered off label as it is FDA approved for only  this duration. However, short-term use contradicts what we now understand about long-term human weight regulation. We discussed that phentermine  monotherapy use longer than 12 weeks has not been formally studied (it has been studied up to 2 years in combination with topiramate   - advised to monitor HR and HTN  - discussed medications contraindicated in pregnancy, reviewed CV s/e   - exercise 150 - 300 min/wk as able, including 75 min moderate intensity    RTC 1 month      ICD-10-CM ICD-9-CM   1. Class 1 obesity due to excess calories without serious comorbidity with body mass index (BMI) of 34.0 to 34.9 in adult  E66.811 278.00    E66.09 V85.34    Z68.34    2. Class 2 obesity due to excess calories without serious comorbidity with body mass index (BMI) of 35.0 to 35.9 in adult  E66.812 278.00    E66.09 V85.35    Z68.35    3. Weight gain  R63.5 783.1      Orders Placed This Encounter   Procedures    CURES Review Documentation - I Reviewed CURES    Weight Management Services - Follow Up in This Department     Requested Prescriptions     Signed Prescriptions Disp Refills    phentermine  (ADIPEX-P ) 37.5 MG tablet 30 tablet 2     Sig: Take 1 tablet (37.5 mg) by mouth every morning (before breakfast).    topiramate  (TOPAMAX ) 25 MG tablet 180 tablet 2     Sig: Take 2 tablets (50 mg) by mouth daily. Every evening    tirzepatide -weight management (ZEPBOUND ) 2.5 MG/0.5ML injection pen 2 mL 2     Sig: Inject 0.5 mL (2.5 mg) under the skin every 7 days.        A total of 30 minutes was spent on this visit reviewing previous notes, counseling the patient, ordering tests and medications, coordinating care and/or documenting the findings in the note.

## 2023-04-14 NOTE — Telephone Encounter (Signed)
 Can this patient see Dr Delma Post via [facility] today at 340pm, if approved please convert, patient is aware this is pending approval

## 2023-04-16 ENCOUNTER — Other Ambulatory Visit: Payer: Self-pay

## 2023-04-21 ENCOUNTER — Other Ambulatory Visit: Payer: Self-pay

## 2023-04-22 ENCOUNTER — Other Ambulatory Visit: Payer: Self-pay

## 2023-04-22 ENCOUNTER — Telehealth (INDEPENDENT_AMBULATORY_CARE_PROVIDER_SITE_OTHER): Payer: Self-pay | Admitting: "Endocrinology

## 2023-04-22 DIAGNOSIS — E66811 Obesity, class 1: Secondary | ICD-10-CM

## 2023-04-26 ENCOUNTER — Other Ambulatory Visit: Payer: Self-pay

## 2023-05-05 ENCOUNTER — Other Ambulatory Visit: Payer: Self-pay

## 2023-05-10 NOTE — Telephone Encounter (Signed)
Last Visit: 04/14/2023  Next Visit: 05/20/2023    Attempted to reach patient but no answer, left voicemail for patient to return call to [facility] Department of Endocrinology.

## 2023-05-11 NOTE — Telephone Encounter (Signed)
Attempted to reach patient but no answer, left voicemail for patient to return call to [facility] Department of Endocrinology.    MYC sent to patient

## 2023-05-13 NOTE — Telephone Encounter (Signed)
 Spoke to patient regarding provider recommendations.  Patient verbalized understanding and appreciative of call

## 2023-05-19 ENCOUNTER — Encounter (INDEPENDENT_AMBULATORY_CARE_PROVIDER_SITE_OTHER): Payer: HMO | Admitting: Family Medicine

## 2023-05-19 NOTE — Progress Notes (Unsigned)
Ruth Hall is a 37yo [id number]     PMH:  #. ASCUS oh Pap  #. Obesity BMI 30  # Anxiety  #. LTBI

## 2023-05-20 ENCOUNTER — Encounter (INDEPENDENT_AMBULATORY_CARE_PROVIDER_SITE_OTHER): Payer: Self-pay

## 2023-05-20 ENCOUNTER — Telehealth (INDEPENDENT_AMBULATORY_CARE_PROVIDER_SITE_OTHER): Payer: HMO | Admitting: "Endocrinology

## 2023-05-20 VITALS — Ht 60.0 in | Wt 150.0 lb

## 2023-05-20 DIAGNOSIS — E6609 Other obesity due to excess calories: Secondary | ICD-10-CM

## 2023-05-20 DIAGNOSIS — E663 Overweight: Secondary | ICD-10-CM

## 2023-05-20 DIAGNOSIS — E66811 Obesity, class 1: Secondary | ICD-10-CM

## 2023-05-20 DIAGNOSIS — Z6835 Body mass index (BMI) 35.0-35.9, adult: Secondary | ICD-10-CM

## 2023-05-20 DIAGNOSIS — E66812 Obesity, class 2: Secondary | ICD-10-CM

## 2023-05-20 DIAGNOSIS — Z6834 Body mass index (BMI) 34.0-34.9, adult: Secondary | ICD-10-CM

## 2023-05-20 MED ORDER — PHENTERMINE HCL 37.5 MG OR TABS
37.5000 mg | ORAL_TABLET | Freq: Every day | ORAL | 2 refills | Status: AC
Start: 2023-05-20 — End: ?

## 2023-05-20 NOTE — Patient Instructions (Signed)
Phentermine 37.5 mg daily  Topiramate 50 mg nightly    Monitor blood pressure daily. Contact clinic for blood pressure > 130/80.

## 2023-05-20 NOTE — Progress Notes (Signed)
[facility]  [facility]  Obesity Medicine  [facility]    Patient Name: Ruth Hall  MRN: [id number]  Date of Birth: 29-Aug-1985  Encounter Date/Time: 05/20/2023    CC:   Chief Complaint   Patient presents with    Follow Up    Weight Management     REQUESTING PHYSICIAN: Ruth Hall  History obtained from the patient and additional records in the EMR .     WEIGHTS         EVENT    DATE  WT/BMI  Wt Control MD #1 (TOC)  04/14/2023 155 lb     #2   05/20/23  150 lb     Wt Readings from Last 10 Encounters:   05/20/23 68 kg (150 lb)   04/14/23 70.3 kg (155 lb)   10/26/22 75.2 kg (165 lb 12.8 oz)   10/16/22 74.4 kg (164 lb)   09/22/22 76.4 kg (168 lb 6.4 oz)   05/20/22 84.8 kg (187 lb)   09/17/21 83.7 kg (184 lb 9.6 oz)   08/04/21 82.6 kg (182 lb)   05/15/21 80.6 kg (177 lb 12.8 oz)   04/08/21 82.6 kg (182 lb)         INTERVAL   Ruth Hall is a 38 year old female with Body mass index is 29.29 kg/m., PMH obesity class 2 --> overweight, knee pain, migraine who presents for weight control follow-up.     Patient of Dr. Bubba Hall, last seen 05/20/2022  Prior visit:  09/2020- Est care with Dr. Bubba Hall- 195 lb - initially trialed phen/top  01/2021- phen 30 mg/ topiramate 25 mg  07/2021- had d/c phentermine/topamax wk prior, resumed topamax, got 1 dose of Ozempic at [facility] clinic- plan to cont topamax, dc phentermine. Start [facility]  04/2022- increase topamax 50 mg, start wegovy if covered, otherwise resume phentermine at high dose 37.5 mg  1/25- TOC - cont phentermine 37.5 mg/ topiramate 50 mg nightly. Re-trial coverage Zepbound- s/p test claim - Saxenda, Wegovy, Zepbound, Phentermine and contrave- all excluded from insurance    INTERVAL:  - lost 5 lb  - on phen 37.5 mg/ top 50 mg nightly  - denies s/e  - BP 138/96  - no HA, changes in vision, palpitations, CP/ SOB  - Exercise- cardio at gym 3x/ wk        Weight goals: pt reports  goals 137 lb        ALLERGIES & MEDICATIONS   Allergies  Penicillins      Current Outpatient Medications:     albuterol 108 (90 Base) MCG/ACT inhaler, Inhale 2 puffs by mouth every 6 hours as needed (cough, congestion, and/or wheezing. Use with spacer)., Disp: 8 g, Rfl: 1    calcipotriene (DOVONEX) 0.005 % ointment, Apply topically to affected area twice a day on the weekends to affected areas on the elbows and knees, Disp: 60 g, Rfl: 5    clobetasol propionate (TMOVATE) 0.05 % solution, Use a small amount as directed once or twice per week to the scalp to prevent flares. When flaring, using once daily M-F, taking a break on the weekends., Disp: 50 mL, Rfl: 11    diphenhydrAMINE (BENADRYL) 50 MG tablet, Take 1 tablet (50 mg) by mouth 3 times daily as needed for Itching., Disp: 30 tablet, Rfl: 0    ergocalciferol (VITAMIN D) 50000 UNIT capsule, Take 1 capsule (50,000 Units) by mouth every 14 days., Disp:  6 capsule, Rfl: 3    escitalopram (LEXAPRO) 10 MG tablet, Take 1 tablet (10 mg) by mouth daily., Disp: 90 tablet, Rfl: 3    methylPREDNISolone (MEDROL DOSEPACK) 4 MG tablet, Take as directed on package, Disp: 1 each, Rfl: 0    omeprazole (PRILOSEC) 20 MG capsule, Take 1 capsule (20 mg) by mouth every morning (before breakfast)., Disp: 30 capsule, Rfl: 2    phentermine (ADIPEX-P) 37.5 MG tablet, Take 1 tablet (37.5 mg) by mouth every morning (before breakfast)., Disp: 30 tablet, Rfl: 2    spacer (AEROCHAMBER) MISC, Use as directed, Disp: 1 each, Rfl: 0    tirzepatide-weight management (ZEPBOUND) 2.5 MG/0.5ML injection pen, Inject 0.5 mL (2.5 mg) under the skin every 7 days., Disp: 2 mL, Rfl: 2    topiramate (TOPAMAX) 25 MG tablet, Take 2 tablets (50 mg) by mouth daily. Every evening, Disp: 180 tablet, Rfl: 2    triamcinolone (KENALOG) 0.1 % cream, Apply 1 Application. topically 2 times daily. 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/02/2020       Past Surgical History:   Procedure Laterality Date    CHOLECYSTECTOMY, LAP  2006    c-section  2005       Socioeconomic History    Marital status: Single    Highest education level: High school graduate   Occupational History    Occupation: receptionist   Tobacco Use    Smoking status: Former     Passive exposure: Never    Smokeless tobacco: Never   Substance and Sexual Activity    Alcohol use: Yes     Alcohol/week: 5.0 - 7.5 standard drinks of alcohol     Types: 2 - 3 Shots of liquor per week    Drug use: No    Sexual activity: Yes     Partners: Male     Birth control/protection: Pill     Comment: Ortho-Evra patch   Social History Narrative    2019 - Lives with mom, 14yo son, and boyfriend.    Cats, dogs, [place] pig, fish       EXAM   Ht 5' (1.524 m)   Wt 68 kg (150 lb)   BMI 29.29 kg/m   GEN:  Well developed adult in no acute distress  EYES: Anicteric. No scleral injection.   ENT:  Normal phonation.   CV: RRR, no murmurs  PULM: Breathing comfortably, speaking in full sentences.   NEURO: Alert. Oriented.   SKIN: No facial rash.   PSYCH:  Pleasant. Appropriate affect.      STUDIES     Lab Results   Component Value Date/Time    A1C 5.3 10/16/2022 10:26 AM    A1C 5.6 08/20/2020 10:31 AM    A1C 5.3 09/23/2017 10:15 AM    A1C 5.3 11/29/2014 10:28 AM    A1C 5.4 05/05/2010 11:20 AM    CHOL 151 10/16/2022 10:26 AM    LDLCALC 77 10/16/2022 10:26 AM    HDL 56 10/16/2022 10:26 AM    TRIG 88 10/16/2022 10:26 AM       Lab Results  Component Value Date    A1C 5.3 10/16/2022    A1C 5.6 08/20/2020    A1C 5.3 09/23/2017     Lab Results   Component Value Date    GLU 87 10/16/2022    CREAT 0.87 10/16/2022    GFRNON >60 08/20/2020    K 4.0 10/16/2022    AST 14 10/16/2022    ALT 22 10/16/2022     Lab Results   Component  Value Date    CHOL 151 10/16/2022    HDL 56 10/16/2022    LDLCALC 77 10/16/2022    TRIG 88 10/16/2022     No results found for: "MICALB", "MACRR"  Lab Results   Component Value Date    TSH 0.83 10/16/2022       IMPRESSION / RECOMMENDATIONS   Quentin Strebel is a 38 year old female with Body mass index is 29.29 kg/m., PMH obesity class 2 --> overweight, knee pain, migraine who presents for weight control follow-up.   ? ??  We reviewed recommendations in detail for diet and physical activity. Recommend close monitoring of caloric intake for caloric restriction, avoiding processed foods, and focus on Mediterranean diet principles. She goes to gym 4x/ wk. Recommend participating in 150-300 minutes of moderate-intensity exercise per week.    Screened for contraindications: (patient is not pregnant or breastfeeding)  Phentermine: denies CV disease (arrhythmias, HF, CAD, CVA), uncontrolled HTN, uncontrolled hyperthyroidism, glaucoma, within 14 days of MAOI  Topiramate: kidney stone  GLP1: denies gastroparesis, gallstones, n/v/d/c, pancreatitis, personal/ fhc MTC/ MEN2 syndrome    BMI 29, overweight, on phen/top. Been taking more consistently with wt loss. S/p re-submission for GLP1 and test claims for branded wt loss medications. Unfortunately Saxenda, Wegovy, Zepbound and contrave all listed as drug exclusion per her insurance plan.      Plan:      - continue phentermine 37.5 mg and topiramate 50 mg nightly   I clearly discussed with patient today that use of phentermine alone longer than 12 weeks is considered "off label" as it is FDA approved for only this duration. However, short-term use contradicts what we now understand about long-term human weight regulation. We discussed that phentermine monotherapy use longer than 12 weeks has not been formally studied (it has been studied up to 2 years in combination with topiramate  - advised to monitor HR and HTN  - discussed medications contraindicated in pregnancy,  reviewed CV s/e   - exercise 150 - 300 min/wk as able, including 75 min moderate intensity    RTC 3 month      ICD-10-CM ICD-9-CM   1. Class 1 obesity due to excess calories without serious comorbidity with body mass index (BMI) of 34.0 to 34.9 in adult  E66.811 278.00    E66.09 V85.34    Z68.34        No orders of the defined types were placed in this encounter.    Requested Prescriptions      No prescriptions requested or ordered in this encounter        A total of 30 minutes was spent on this visit reviewing previous notes, counseling the patient, ordering tests and medications, coordinating care and/or documenting the findings in the note.

## 2023-05-24 NOTE — Progress Notes (Unsigned)
 Ruth Hall is a 37yo G1P1001     PMH:  #. ASCUS oh Pap  #. Obesity BMI 29  # Anxiety  #. LTBI

## 2023-05-25 ENCOUNTER — Encounter (INDEPENDENT_AMBULATORY_CARE_PROVIDER_SITE_OTHER): Payer: HMO | Admitting: Family Medicine

## 2023-05-25 DIAGNOSIS — Z23 Encounter for immunization: Secondary | ICD-10-CM

## 2023-09-02 ENCOUNTER — Telehealth (INDEPENDENT_AMBULATORY_CARE_PROVIDER_SITE_OTHER): Payer: HMO | Admitting: "Endocrinology

## 2023-09-22 ENCOUNTER — Encounter (INDEPENDENT_AMBULATORY_CARE_PROVIDER_SITE_OTHER): Payer: Self-pay | Admitting: Family Medicine

## 2023-09-22 NOTE — Progress Notes (Signed)
 Patient seen Status: Patient was evaluated via telemedicine (audio or audio/video)    Ruth Hall is a 38yo G1P1001 who presents for medication refill.  Current flare of psoriasis.  Needs to establish with new dermatologist, last seen in Jan 2024 and her dermatologist has left [place]    PMH:  #. Hx HPV  #. Hx CIN1  #. BMI 29 - [facility]  #. GAD  #. LTBI    I reviewed the patient's medical record, including PMH, SH, medications, allergies, and ROS.    Physical Exam: General Appearance: healthy, alert, no distress, pleasant affect, cooperative.        05/15/2021     2:50 PM 05/16/2021     9:55 AM 05/23/2021    10:00 AM 09/17/2021     1:56 PM 09/22/2022     6:22 AM 10/08/2022     8:47 AM 09/23/2023     9:09 AM   [facility] PHQ9 DEPRESSION QUESTIONNAIRE   Interest 0 1 1 1  2  0 1   Depressed 1 0 0 1  2 2 1    Sleep 1 -- 1 0 3 2 --   Energy 0 -- 0 0 3 2 --   Appetite 0 -- 0 1 0 0 --   Failure 1 -- 0 0 2 0 --   Concentration 0 -- 0 1 0 0 --   Movement 0 -- 0 0 0 0 --   Suicide 0 -- -- 0 0 0 --   Summary(Manual) -- -- -- -- -- -- --   Summary(Calculated) 3 -- -- 4 12 6  --   Functional Somewhat difficult -- Somewhat difficult Not difficult at all Not difficult at all Not difficult at all --       Proxy-reported         05/15/2021     2:51 PM 09/17/2021     1:56 PM 09/22/2022     6:21 AM 10/08/2022     8:46 AM 10/16/2022     8:23 AM 10/26/2022    11:47 AM 09/23/2023     9:08 AM   GAD 7   1. Feeling nervous, anxious or on edge  1  2 0 2 0 1   2. Not being able to stop or control worrying  1  0 0 2 2 3    3. Worrying too much about different things 1 1  2  0 2 2 3    4. Trouble relaxing 1 1  2 2 2  0 3   5. Being so restless that it is hard to sit still 1 0  2 2 2  0 3   6. Being easily annoyed or irritable 1 1  0 2 0 0 3   7. Feeling afraid as if something awful might happen 0 0  0 0 2 2 1    GAD7 Patient Total  5 8 6 12 6 17     If you checked off any problems, how difficult have these problems made it for you to do your job  along with other people? Somewhat difficult  Not difficult at all Not difficult at all Somewhat difficult Not difficult at all Extremely difficult       Patient-reported    Proxy-reported       ASSESSMENT/PLAN:    Diagnoses and all orders for this visit:    Psoriasis  -     Consult/Referral to Dermatology - General Dermatology  -     triamcinolone  (KENALOG )  0.1 % cream; Apply a thin layer as directed twice daily M-F, take a break on the weekends  -     calcipotriene  (DOVONEX ) 0.005 % ointment; Apply topically to affected area twice a day on the weekends to affected areas on the elbows and knees  -     ketoconazole  (NIZORAL ) 2 % shampoo; For the scalp, useketoconazole shampoo as directed 1-2 times weekly. Please let sit for 5-10 minutes.  -     clobetasol  propionate (TMOVATE) 0.05 % solution; Use a small amount as directed once or twice per week to the scalp to prevent flares. When flaring, using once daily M-F, taking a break on the weekends.    Need for vaccination  -     Tdap (BOOSTRIX or Adacel) for patients 40-63 years old  -     Gardasil -9 HPV Vaccine 3-Dose  (Admin now)  -     Gardasil -9 HPV Vaccine 3-Dose  (Admin in 1 to 2 mon); Future  -     Gardasil -9 HPV Vaccine 3-Dose (Admin in 6 mon); Future  -     COVID-19 (PFIZER) Seasonal Vaccine 12+ Years IM

## 2023-09-23 ENCOUNTER — Encounter (INDEPENDENT_AMBULATORY_CARE_PROVIDER_SITE_OTHER): Payer: Self-pay

## 2023-09-23 ENCOUNTER — Telehealth (INDEPENDENT_AMBULATORY_CARE_PROVIDER_SITE_OTHER): Admitting: Family Medicine

## 2023-09-23 DIAGNOSIS — L409 Psoriasis, unspecified: Secondary | ICD-10-CM

## 2023-09-23 DIAGNOSIS — Z23 Encounter for immunization: Secondary | ICD-10-CM

## 2023-09-23 MED ORDER — CLOBETASOL PROPIONATE 0.05 % EX SOLN: CUTANEOUS | 1 refills | Status: DC

## 2023-09-23 MED ORDER — CALCIPOTRIENE 0.005 % EX OINT: TOPICAL_OINTMENT | CUTANEOUS | 0 refills | Status: DC

## 2023-09-23 MED ORDER — KETOCONAZOLE 2 % EX SHAM: MEDICATED_SHAMPOO | CUTANEOUS | 2 refills | Status: DC

## 2023-09-23 MED ORDER — TRIAMCINOLONE ACETONIDE 0.1 % EX CREA: TOPICAL_CREAM | CUTANEOUS | 0 refills | Status: DC

## 2023-09-24 ENCOUNTER — Encounter (INDEPENDENT_AMBULATORY_CARE_PROVIDER_SITE_OTHER): Payer: Self-pay | Admitting: Family Medicine

## 2024-05-01 ENCOUNTER — Other Ambulatory Visit (INDEPENDENT_AMBULATORY_CARE_PROVIDER_SITE_OTHER): Payer: Self-pay | Admitting: Family Medicine

## 2024-05-01 ENCOUNTER — Other Ambulatory Visit (INDEPENDENT_AMBULATORY_CARE_PROVIDER_SITE_OTHER): Payer: Self-pay | Admitting: "Endocrinology

## 2024-05-01 DIAGNOSIS — L409 Psoriasis, unspecified: Secondary | ICD-10-CM

## 2024-05-01 DIAGNOSIS — E6609 Other obesity due to excess calories: Secondary | ICD-10-CM

## 2024-05-01 NOTE — Telephone Encounter (Signed)
 [place] [facility]       Controlled substances (Refill and PA requests) are not currently included in Pharmacy Refill Clinic protocol. Re-routing to appropriate staff for processing. Thank you.

## 2024-05-02 MED ORDER — KETOCONAZOLE 2 % EX SHAM: MEDICATED_SHAMPOO | CUTANEOUS | 2 refills | Status: AC

## 2024-05-02 MED ORDER — CALCIPOTRIENE 0.005 % EX OINT: TOPICAL_OINTMENT | CUTANEOUS | 0 refills | Status: AC

## 2024-05-02 MED ORDER — CLOBETASOL PROPIONATE 0.05 % EX SOLN: CUTANEOUS | 1 refills | Status: AC

## 2024-05-02 MED ORDER — TRIAMCINOLONE ACETONIDE 0.1 % EX CREA: TOPICAL_CREAM | CUTANEOUS | 0 refills | Status: AC
